# Patient Record
Sex: Female | Born: 1975 | Race: Black or African American | Hispanic: No | Marital: Single | Smoking: Former smoker
Health system: Southern US, Community
[De-identification: ages and names within clinical notes are randomized; demographics above are authoritative.]

## PROBLEM LIST (undated history)

## (undated) DIAGNOSIS — E785 Hyperlipidemia, unspecified: Secondary | ICD-10-CM

## (undated) DIAGNOSIS — E282 Polycystic ovarian syndrome: Secondary | ICD-10-CM

## (undated) DIAGNOSIS — E119 Type 2 diabetes mellitus without complications: Secondary | ICD-10-CM

## (undated) DIAGNOSIS — E079 Disorder of thyroid, unspecified: Secondary | ICD-10-CM

## (undated) DIAGNOSIS — J45909 Unspecified asthma, uncomplicated: Secondary | ICD-10-CM

## (undated) DIAGNOSIS — Z789 Other specified health status: Secondary | ICD-10-CM

## (undated) DIAGNOSIS — F419 Anxiety disorder, unspecified: Secondary | ICD-10-CM

## (undated) DIAGNOSIS — B977 Papillomavirus as the cause of diseases classified elsewhere: Secondary | ICD-10-CM

## (undated) DIAGNOSIS — G43909 Migraine, unspecified, not intractable, without status migrainosus: Secondary | ICD-10-CM

## (undated) DIAGNOSIS — J302 Other seasonal allergic rhinitis: Secondary | ICD-10-CM

## (undated) HISTORY — DX: Other specified health status: Z78.9

## (undated) HISTORY — DX: Type 2 diabetes mellitus without complications: E11.9

## (undated) HISTORY — PX: OTHER SURGICAL HISTORY: SHX169

## (undated) HISTORY — DX: Unspecified asthma, uncomplicated: J45.909

## (undated) HISTORY — PX: WISDOM TOOTH EXTRACTION: SHX21

## (undated) HISTORY — DX: Anxiety disorder, unspecified: F41.9

## (undated) HISTORY — DX: Other seasonal allergic rhinitis: J30.2

## (undated) HISTORY — DX: Hyperlipidemia, unspecified: E78.5

## (undated) HISTORY — DX: Papillomavirus as the cause of diseases classified elsewhere: B97.7

## (undated) HISTORY — PX: EXCISION VAGINAL CYST: SHX5825

## (undated) HISTORY — DX: Migraine, unspecified, not intractable, without status migrainosus: G43.909

---

## 1998-11-09 ENCOUNTER — Emergency Department (HOSPITAL_COMMUNITY): Admission: EM | Admit: 1998-11-09 | Discharge: 1998-11-09 | Payer: Self-pay | Admitting: Emergency Medicine

## 2001-12-26 ENCOUNTER — Other Ambulatory Visit: Admission: RE | Admit: 2001-12-26 | Discharge: 2001-12-26 | Payer: Self-pay | Admitting: Obstetrics and Gynecology

## 2002-04-27 ENCOUNTER — Emergency Department (HOSPITAL_COMMUNITY): Admission: EM | Admit: 2002-04-27 | Discharge: 2002-04-27 | Payer: Self-pay | Admitting: Emergency Medicine

## 2002-04-27 ENCOUNTER — Encounter: Payer: Self-pay | Admitting: Emergency Medicine

## 2002-05-01 ENCOUNTER — Emergency Department (HOSPITAL_COMMUNITY): Admission: EM | Admit: 2002-05-01 | Discharge: 2002-05-02 | Payer: Self-pay | Admitting: Emergency Medicine

## 2002-05-02 ENCOUNTER — Encounter: Payer: Self-pay | Admitting: Emergency Medicine

## 2003-04-08 ENCOUNTER — Other Ambulatory Visit: Admission: RE | Admit: 2003-04-08 | Discharge: 2003-04-08 | Payer: Self-pay | Admitting: Obstetrics and Gynecology

## 2003-07-30 ENCOUNTER — Encounter: Admission: RE | Admit: 2003-07-30 | Discharge: 2003-07-30 | Payer: Self-pay | Admitting: Obstetrics and Gynecology

## 2009-03-17 ENCOUNTER — Emergency Department (HOSPITAL_COMMUNITY): Admission: EM | Admit: 2009-03-17 | Discharge: 2009-03-18 | Payer: Self-pay | Admitting: Emergency Medicine

## 2010-10-02 LAB — URINALYSIS, ROUTINE W REFLEX MICROSCOPIC
Bilirubin Urine: NEGATIVE
Glucose, UA: NEGATIVE mg/dL
Ketones, ur: NEGATIVE mg/dL
Leukocytes, UA: NEGATIVE
Nitrite: NEGATIVE
Protein, ur: NEGATIVE mg/dL
Specific Gravity, Urine: 1.028 (ref 1.005–1.030)
Urobilinogen, UA: 1 mg/dL (ref 0.0–1.0)
pH: 6.5 (ref 5.0–8.0)

## 2010-10-02 LAB — URINE MICROSCOPIC-ADD ON

## 2010-10-02 LAB — POCT PREGNANCY, URINE: Preg Test, Ur: NEGATIVE

## 2010-10-02 LAB — GC/CHLAMYDIA PROBE AMP, GENITAL
Chlamydia, DNA Probe: NEGATIVE
GC Probe Amp, Genital: NEGATIVE

## 2012-01-09 ENCOUNTER — Emergency Department (INDEPENDENT_AMBULATORY_CARE_PROVIDER_SITE_OTHER)
Admission: EM | Admit: 2012-01-09 | Discharge: 2012-01-09 | Disposition: A | Discharge status: Home / Self Care | Payer: PRIVATE HEALTH INSURANCE | Source: Home / Self Care | Attending: Emergency Medicine | Admitting: Emergency Medicine

## 2012-01-09 ENCOUNTER — Encounter (HOSPITAL_COMMUNITY): Payer: Self-pay | Admitting: Emergency Medicine

## 2012-01-09 DIAGNOSIS — J069 Acute upper respiratory infection, unspecified: Secondary | ICD-10-CM

## 2012-01-09 HISTORY — DX: Polycystic ovarian syndrome: E28.2

## 2012-01-09 MED ORDER — AMOXICILLIN 500 MG PO CAPS: 500.0000 mg | ORAL_CAPSULE | Freq: Three times a day (TID) | ORAL | Status: AC

## 2012-01-09 MED ORDER — LORATADINE-PSEUDOEPHEDRINE ER 5-120 MG PO TB12: 1.0000 | ORAL_TABLET | Freq: Two times a day (BID) | ORAL | Status: DC

## 2012-01-09 MED ORDER — GUAIFENESIN-CODEINE 100-10 MG/5ML PO SYRP: 5.0000 mL | ORAL_SOLUTION | Freq: Three times a day (TID) | ORAL | Status: AC | PRN

## 2012-01-09 NOTE — ED Notes (Signed)
Sinus drainage, chest congestion, changes in sound of voice.  Intermittent sore throat.  Initially mucus was green, now yellow.  Onset Wednesday night

## 2012-01-09 NOTE — ED Provider Notes (Signed)
History     CSN: [id number]  Arrival date & time 01/09/12  1224   First MD Initiated Contact with Patient 01/09/12 1256      Chief Complaint  Patient presents with  . URI    (Consider location/radiation/quality/duration/timing/severity/associated sxs/prior treatment) HPI Comments: Since Wednesday patient has been experiencing sinus drainage and nasal congestion along with a sore throat most recently with a cough that occasionally is dry but occasionally she brings up sputum looking with yellow to green in character. Patient says is feeling tired and fatigue and have tried over-the-counter medicines that include oxacillin surplus and Tylenol. She denies any wheezing or shortness of breath.  Patient is a 36 y.o. female presenting with URI.  URI The primary symptoms include fatigue, sore throat and cough. Primary symptoms do not include fever, ear pain, wheezing, abdominal pain, myalgias, arthralgias or rash. The current episode started 3 to 5 days ago. This is a new problem. The problem has not changed since onset. Symptoms associated with the illness include chills, sinus pressure, congestion and rhinorrhea.    No past medical history on file.  No past surgical history on file.  No family history on file.  History  Substance Use Topics  . Smoking status: Not on file  . Smokeless tobacco: Not on file  . Alcohol Use: Not on file    OB History    No data available      Review of Systems  Constitutional: Positive for chills, activity change, appetite change and fatigue. Negative for fever, diaphoresis and unexpected weight change.  HENT: Positive for congestion, sore throat, rhinorrhea, neck pain and sinus pressure. Negative for ear pain, neck stiffness and ear discharge.   Eyes: Negative for photophobia and visual disturbance.  Respiratory: Positive for cough. Negative for choking, chest tightness, shortness of breath and wheezing.   Gastrointestinal: Negative for abdominal  pain.  Musculoskeletal: Negative for myalgias and arthralgias.  Skin: Negative for rash.  Neurological: Negative for dizziness.    Allergies  Allegra and Sulfa antibiotics  Home Medications   Current Outpatient Rx  Name Route Sig Dispense Refill  . AMOXICILLIN 500 MG PO CAPS Oral Take 1 capsule (500 mg total) by mouth 3 (three) times daily. 30 capsule 0  . GUAIFENESIN-CODEINE 100-10 MG/5ML PO SYRP Oral Take 5 mLs by mouth 3 (three) times daily as needed for cough. 120 mL 0  . LORATADINE-PSEUDOEPHEDRINE ER 5-120 MG PO TB12 Oral Take 1 tablet by mouth 2 (two) times daily. 14 tablet 0    BP 120/58  Pulse 92  Temp 98.8 F (37.1 C) (Oral)  Resp 16  SpO2 100%  Physical Exam  Nursing note and vitals reviewed. Constitutional: Vital signs are normal. She appears well-developed and well-nourished.  Non-toxic appearance. She does not have a sickly appearance. She does not appear ill. No distress.  HENT:  Head: Normocephalic.  Right Ear: Tympanic membrane normal.  Left Ear: Tympanic membrane normal.  Nose: Nose normal.  Mouth/Throat: Uvula is midline and mucous membranes are normal. Posterior oropharyngeal erythema present. No oropharyngeal exudate, posterior oropharyngeal edema or tonsillar abscesses.  Eyes: Conjunctivae are normal. Right eye exhibits no discharge. Left eye exhibits no discharge. No scleral icterus.  Neck: Trachea normal. Neck supple. No JVD present. No Kernig's sign noted.  Cardiovascular: Normal rate.   Pulmonary/Chest: Effort normal and breath sounds normal. No respiratory distress. She has no decreased breath sounds. She has no wheezes. She has no rhonchi. She has no rales.  Abdominal: Soft.  Musculoskeletal: Normal range of motion.  Lymphadenopathy:    She has no cervical adenopathy.  Neurological: She is alert.  Skin: No erythema.    ED Course  Procedures (including critical care time)  Labs Reviewed - No data to display No results found.   1. Upper  respiratory infection       MDM  Uncomplicated upper respiratory process. Patient encouraged to treat herself symptomatically with prescribed codeine syrup and Claritin D. to use Tylenol for temperatures above 100.8. If symptoms were to persist or worsen her last greater than 8 days as patient to either return or to start taking provided antibiotic prescription. Patient agree with treatment plan symptomatic management and will pursue further hydration, before starting with antibiotic        Jimmie Molly, MD 01/09/12 1433

## 2012-04-18 ENCOUNTER — Ambulatory Visit (INDEPENDENT_AMBULATORY_CARE_PROVIDER_SITE_OTHER): Payer: BC Managed Care – PPO | Admitting: Family Medicine

## 2012-04-18 ENCOUNTER — Encounter: Payer: Self-pay | Admitting: Family Medicine

## 2012-04-18 VITALS — BP 118/72 | Temp 98.0°F | Ht 72.0 in | Wt 147.0 lb

## 2012-04-18 DIAGNOSIS — E0590 Thyrotoxicosis, unspecified without thyrotoxic crisis or storm: Secondary | ICD-10-CM

## 2012-04-18 MED ORDER — VARENICLINE TARTRATE 0.5 MG PO TABS: ORAL_TABLET | ORAL | Status: DC

## 2012-04-18 MED ORDER — ATENOLOL 25 MG PO TABS: 25.0000 mg | ORAL_TABLET | Freq: Every day | ORAL | Status: DC

## 2012-04-18 NOTE — Progress Notes (Signed)
Subjective:    Patient ID: Danielle [name], female    DOB: 1976/04/30, 36 y.o.   MRN: [id number]  HPI Danielle [name] is a 36 year old single female nonsmoker who comes in today on referral from her GYN Dr. Malva Limes for evaluation of hyperthyroidism  Dr. Dareen Piano saw her recently for a pelvic examination which was normal. At the time they did some lab work because she wasn't feeling well. TSH level came back 0.006 with a free T4 of 4.9  She states she really has not felt well since February. In February she began to lose weight and over the past 9 months has lost 30 pounds. Weight is down to 147 pounds.  She's also noticed episodes of rapid heart rate nervousness anxiety which she attributed to the stress of working and going to school at the same time. Yesterday she be signed at her job at [employer] because of the stress.  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 Dareen Piano normal. She does smoke 2 cigarettes a day  Review of systems reveals that she has migraine headaches but they're episodic last migraine was 2007. She does wear glasses for distance vision. She does get regular dental care. She was diagnosed in 2004 to have asthma last attack was 2005. GI review of systems negative GU negative except she did have a history of constipation now she is having 3 bowel movements daily. She is on BCP by Dr. Dareen Piano she does not do BSE monthly. Her paternal grandmother and aunt have both had breast cancer. The rest review of systems negative social history she is a Physicist, medical a Clinical research associate major  Family history see note mother has a history of hypothyroidism     Review of Systems  Constitutional: Positive for unexpected weight change.  HENT: Negative.   Eyes: Negative.   Respiratory: Negative.   Cardiovascular: Positive for palpitations.  Gastrointestinal: Negative.   Genitourinary: Negative.   Musculoskeletal:  Negative.   Neurological: Negative.   Hematological: Negative.   Psychiatric/Behavioral: Positive for agitation.       Objective:   Physical Exam  Constitutional: She appears well-developed and well-nourished.  HENT:  Head: Normocephalic and atraumatic.  Right Ear: External ear normal.  Left Ear: External ear normal.  Nose: Nose normal.  Mouth/Throat: Oropharynx is clear and moist.  Eyes: EOM are normal. Pupils are equal, round, and reactive to light.  Neck: Normal range of motion. Neck supple. No thyromegaly present.  Cardiovascular: Normal rate, regular rhythm, normal heart sounds and intact distal pulses.  Exam reveals no gallop and no friction rub.   No murmur heard. Pulmonary/Chest: Effort normal and breath sounds normal.  Abdominal: Soft. Bowel sounds are normal. She exhibits no distension and no mass. There is no tenderness. There is no rebound.  Genitourinary:       Bilateral breast exam shows a prominent fibrous cystic lesion marble size left breast 10:00 1 inch from the nipple also multiple BB sized fibrocystic changes throughout both breasts  Musculoskeletal: Normal range of motion.  Lymphadenopathy:    She has no cervical adenopathy.  Neurological: She is alert. She has normal reflexes. No cranial nerve deficit. She exhibits normal muscle tone. Coordination normal.  Skin: Skin is warm and dry.       Skin exam normal tattoo on her back  Psychiatric: She has a normal mood and affect. Her behavior is normal. Judgment and thought content normal.  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

## 2012-04-18 NOTE — Patient Instructions (Signed)
We will get she is setup for a thyroid scan ASAP  Return to see me 2-3 days after the scan for followup  Begin a Tenormin one tablet daily  Also begin the Chantix one half tab daily

## 2012-05-03 ENCOUNTER — Encounter (HOSPITAL_COMMUNITY)
Admission: RE | Admit: 2012-05-03 | Discharge: 2012-05-03 | Disposition: A | Discharge status: Home / Self Care | Payer: BC Managed Care – PPO | Source: Ambulatory Visit | Attending: Family Medicine | Admitting: Family Medicine

## 2012-05-03 DIAGNOSIS — E0590 Thyrotoxicosis, unspecified without thyrotoxic crisis or storm: Secondary | ICD-10-CM

## 2012-05-04 ENCOUNTER — Encounter (HOSPITAL_COMMUNITY)
Admission: RE | Admit: 2012-05-04 | Discharge: 2012-05-04 | Disposition: A | Discharge status: Home / Self Care | Payer: BC Managed Care – PPO | Source: Ambulatory Visit | Attending: Family Medicine | Admitting: Family Medicine

## 2012-05-04 MED ORDER — SODIUM PERTECHNETATE TC 99M INJECTION
10.5000 | Freq: Once | INTRAVENOUS | Status: AC | PRN
  Administered 2012-05-04: 11 via INTRAVENOUS

## 2012-05-04 MED ORDER — SODIUM IODIDE I 131 CAPSULE
11.7000 | Freq: Once | INTRAVENOUS | Status: AC | PRN
  Administered 2012-05-04: 11.7 via ORAL

## 2012-05-08 ENCOUNTER — Ambulatory Visit (INDEPENDENT_AMBULATORY_CARE_PROVIDER_SITE_OTHER): Payer: BC Managed Care – PPO | Admitting: Family Medicine

## 2012-05-08 ENCOUNTER — Encounter: Payer: Self-pay | Admitting: Family Medicine

## 2012-05-08 DIAGNOSIS — Z720 Tobacco use: Secondary | ICD-10-CM

## 2012-05-08 DIAGNOSIS — E0590 Thyrotoxicosis, unspecified without thyrotoxic crisis or storm: Secondary | ICD-10-CM

## 2012-05-08 DIAGNOSIS — Q6500 Congenital dislocation of unspecified hip, unilateral: Secondary | ICD-10-CM

## 2012-05-08 DIAGNOSIS — M25551 Pain in right hip: Secondary | ICD-10-CM

## 2012-05-08 DIAGNOSIS — F17200 Nicotine dependence, unspecified, uncomplicated: Secondary | ICD-10-CM

## 2012-05-08 DIAGNOSIS — E0500 Thyrotoxicosis with diffuse goiter without thyrotoxic crisis or storm: Secondary | ICD-10-CM

## 2012-05-08 DIAGNOSIS — Z8639 Personal history of other endocrine, nutritional and metabolic disease: Secondary | ICD-10-CM | POA: Insufficient documentation

## 2012-05-08 DIAGNOSIS — M25559 Pain in unspecified hip: Secondary | ICD-10-CM

## 2012-05-08 DIAGNOSIS — Q652 Congenital dislocation of hip, unspecified: Secondary | ICD-10-CM

## 2012-05-08 MED ORDER — VARENICLINE TARTRATE 1 MG PO TABS: ORAL_TABLET | ORAL | Status: DC

## 2012-05-08 NOTE — Progress Notes (Signed)
  Subjective:    Patient ID: Danielle [name], female    DOB: [birth year], 36 y.o.   MRN: [id number]  HPI Danielle [name] is a 36 year old single female nonsmoker who comes in today for followup of Graves' disease  We saw her recently with symptoms of hyperthyroidism. Her scan and uptake showed a 51% symmetrical uptake. No evidence of malignancy. She's on Tenormin 25 mg daily to suppress rapid heart rate in tremor.  We spent a fair amount of time discussing the cause,,,,,,, which is unknown,,,,,,, and the treatment options. She's accompanied today by her mother who states that she had the same problem postpartum and elected to take the pills instead of the I-131. She states that on the pills she became euthyroid and has stayed that way since.   Review of Systems    general and endocrinology review of systems otherwise negative Objective:   Physical Exam Well-developed well-nourished female no acute distress cardiac exam normal except for rapid heart rate 90 and regular Thyroid symmetrically enlarged       Assessment & Plan:  Graves' disease. Plan recommend the I-131

## 2012-05-08 NOTE — Patient Instructions (Signed)
Increase the Tenormin take one twice daily  You will get a call from radiology to come in and get the I-131  See me 10 days after your treatment for followup

## 2012-05-09 ENCOUNTER — Ambulatory Visit (INDEPENDENT_AMBULATORY_CARE_PROVIDER_SITE_OTHER)
Admission: RE | Admit: 2012-05-09 | Discharge: 2012-05-09 | Disposition: A | Discharge status: Home / Self Care | Payer: BC Managed Care – PPO | Source: Ambulatory Visit | Attending: Family Medicine | Admitting: Family Medicine

## 2012-05-09 ENCOUNTER — Other Ambulatory Visit (HOSPITAL_COMMUNITY): Payer: Self-pay | Admitting: Diagnostic Radiology

## 2012-05-09 ENCOUNTER — Other Ambulatory Visit: Payer: Self-pay | Admitting: Family Medicine

## 2012-05-09 DIAGNOSIS — E0500 Thyrotoxicosis with diffuse goiter without thyrotoxic crisis or storm: Secondary | ICD-10-CM

## 2012-05-09 DIAGNOSIS — M25559 Pain in unspecified hip: Secondary | ICD-10-CM

## 2012-05-09 DIAGNOSIS — Q652 Congenital dislocation of hip, unspecified: Secondary | ICD-10-CM

## 2012-05-09 DIAGNOSIS — M25551 Pain in right hip: Secondary | ICD-10-CM

## 2012-05-09 DIAGNOSIS — Q6500 Congenital dislocation of unspecified hip, unilateral: Secondary | ICD-10-CM

## 2012-06-02 ENCOUNTER — Encounter (HOSPITAL_COMMUNITY)
Admission: RE | Admit: 2012-06-02 | Discharge: 2012-06-02 | Disposition: A | Discharge status: Home / Self Care | Payer: BC Managed Care – PPO | Source: Ambulatory Visit | Attending: Family Medicine | Admitting: Family Medicine

## 2012-06-02 ENCOUNTER — Telehealth: Payer: Self-pay | Admitting: *Deleted

## 2012-06-02 DIAGNOSIS — E0500 Thyrotoxicosis with diffuse goiter without thyrotoxic crisis or storm: Secondary | ICD-10-CM

## 2012-06-02 LAB — HCG, SERUM, QUALITATIVE: Preg, Serum: NEGATIVE

## 2012-06-02 MED ORDER — SODIUM IODIDE I 131 CAPSULE
17.3000 | Freq: Once | INTRAVENOUS | Status: AC | PRN
  Administered 2012-06-02: 17.3 via ORAL

## 2012-06-02 NOTE — Telephone Encounter (Signed)
PATIENT AWARE

## 2012-06-02 NOTE — Telephone Encounter (Signed)
Yes continue atenolol

## 2012-06-02 NOTE — Telephone Encounter (Signed)
Patient just returned from the hospital after taking the 131 Iodine pill.  She is wondering if it is still safe to take her atenolol? Please advise.

## 2012-06-02 NOTE — Telephone Encounter (Signed)
Pt notified to continue Atenolol per Dr. Amador Cunas. Pt verbalized understanding.

## 2012-06-12 ENCOUNTER — Encounter: Payer: Self-pay | Admitting: Family Medicine

## 2012-06-12 ENCOUNTER — Ambulatory Visit (INDEPENDENT_AMBULATORY_CARE_PROVIDER_SITE_OTHER): Payer: BC Managed Care – PPO | Admitting: Family Medicine

## 2012-06-12 VITALS — BP 110/70 | Temp 98.8°F | Wt 148.0 lb

## 2012-06-12 DIAGNOSIS — E0500 Thyrotoxicosis with diffuse goiter without thyrotoxic crisis or storm: Secondary | ICD-10-CM

## 2012-06-12 DIAGNOSIS — E0590 Thyrotoxicosis, unspecified without thyrotoxic crisis or storm: Secondary | ICD-10-CM

## 2012-06-12 LAB — T4, FREE: Free T4: 3.32 ng/dL — ABNORMAL HIGH (ref 0.60–1.60)

## 2012-06-12 LAB — T3, FREE: T3, Free: 11 pg/mL — ABNORMAL HIGH (ref 2.3–4.2)

## 2012-06-12 MED ORDER — ATENOLOL 25 MG PO TABS: ORAL_TABLET | ORAL | Status: DC

## 2012-06-12 NOTE — Patient Instructions (Signed)
Increase the Tenormin take 2 in the morning one at bedtime  I will call you about your lab work and we will decide followup

## 2012-06-12 NOTE — Progress Notes (Signed)
  Subjective:    Patient ID: Danielle [name], female    DOB: Jan 14, 1976, 36 y.o.   MRN: [id number]  HPI Danielle [name] is a 36 year old single female who is trying to quit smoking,,,,,, she's had 3 cigarettes in 3 weeks,,,,,,,, who comes in today for followup of hyperthyroidism  10 days ago she received her dose of RAI. She says she's starting to feel better. She is less anxious although she has been experiencing intermittent headaches and some soreness in the back of her legs. She's also having a lot of palpitations which are mainly controlled with the Tenormin 25 mg twice a day   Review of Systems Review of systems negative    Objective:   Physical Exam  Well-developed well-nourished female no acute distress examination neck shows symmetrical enlargement of the thyroid gland cardiac exam normal pulse 70 and regular BP 110/80      Assessment & Plan:  Hyperthyroidism slowly resolving plan follow TSH levels begin Synthroid when levels begin to drop

## 2012-07-04 ENCOUNTER — Other Ambulatory Visit (INDEPENDENT_AMBULATORY_CARE_PROVIDER_SITE_OTHER): Payer: BC Managed Care – PPO

## 2012-07-04 DIAGNOSIS — E039 Hypothyroidism, unspecified: Secondary | ICD-10-CM

## 2012-07-04 LAB — T3, FREE: T3, Free: 4.4 pg/mL — ABNORMAL HIGH (ref 2.3–4.2)

## 2012-07-05 LAB — T4, FREE: Free T4: 1.87 ng/dL — ABNORMAL HIGH (ref 0.60–1.60)

## 2012-07-07 ENCOUNTER — Other Ambulatory Visit: Payer: BC Managed Care – PPO

## 2012-08-15 ENCOUNTER — Encounter: Payer: Self-pay | Admitting: Family Medicine

## 2012-08-15 ENCOUNTER — Ambulatory Visit (INDEPENDENT_AMBULATORY_CARE_PROVIDER_SITE_OTHER): Payer: BC Managed Care – PPO | Admitting: Family Medicine

## 2012-08-15 VITALS — BP 102/70 | Temp 99.4°F | Wt 162.0 lb

## 2012-08-15 DIAGNOSIS — E0590 Thyrotoxicosis, unspecified without thyrotoxic crisis or storm: Secondary | ICD-10-CM

## 2012-08-15 DIAGNOSIS — R358 Other polyuria: Secondary | ICD-10-CM

## 2012-08-15 DIAGNOSIS — E282 Polycystic ovarian syndrome: Secondary | ICD-10-CM

## 2012-08-15 LAB — POCT URINALYSIS DIPSTICK
Blood, UA: NEGATIVE
Ketones, UA: NEGATIVE
Protein, UA: NEGATIVE
Spec Grav, UA: 1.02
Urobilinogen, UA: 0.2

## 2012-08-15 MED ORDER — SPIRONOLACTONE 100 MG PO TABS: 100.0000 mg | ORAL_TABLET | Freq: Every day | ORAL | Status: DC

## 2012-08-15 NOTE — Patient Instructions (Addendum)
Drink lots of water  Milk of magnesia 2 tablespoons twice daily and 1 MiraLax at bedtime  At goal is to have one or 2 loose bowel movements daily  If after one week your not at goal increase the milk of magnesia 3 tablespoons twice daily  I will call you I gets her lab work back  Spironolactone 1 daily

## 2012-08-15 NOTE — Progress Notes (Signed)
  Subjective:    Patient ID: Danielle [name], female    DOB: Oct 11, 1975, 37 y.o.   MRN: [id number]  HPI Danielle [name] is a  37 year old single female nonsmoker who comes in today for followup of Graves' disease  She developed Graves' disease with profound hyper thyroidism and was given R1 31 about 6 weeks ago. She's here today for followup. She saw her GYN today Dr. Dareen Piano and had numerous studies all of which are pending. She complains of increased urination lower bowel pain fatigue and constipation. She states she only has one bowel movement per week.   Review of Systems Review of systems otherwise negative    Objective:   Physical Exam Well-developed well-nourished thin female no acute distress neck exam normal cardiac exam normal abdominal exam normal except for enlarged:       Assessment & Plan:  Graves' disease plan check TSH level  Constipation begin program to achieve normal bowel habits

## 2012-08-16 LAB — HEMOGLOBIN A1C: Hgb A1c MFr Bld: 5.9 % (ref 4.6–6.5)

## 2012-08-16 LAB — TSH: TSH: 10.46 u[IU]/mL — ABNORMAL HIGH (ref 0.35–5.50)

## 2012-08-18 ENCOUNTER — Telehealth: Payer: Self-pay | Admitting: *Deleted

## 2012-08-18 MED ORDER — LEVOTHYROXINE SODIUM 50 MCG PO TABS: 50.0000 ug | ORAL_TABLET | Freq: Every day | ORAL | Status: DC

## 2012-08-18 NOTE — Telephone Encounter (Signed)
Message copied by Trenton Gammon on Fri Aug 18, 2012  1:54 PM ------      Message from: TODD, JEFFREY A      Created: Wed Aug 16, 2012  1:06 PM       Please call,,,,,,,,,,, lab work indicates that she is becoming hypothyroid which is what we wanted to do with the medication now it's time to start a thyroid supplement Synthroid 50 mcg daily dispense 100 tabs refills x3.  Followup office visit in 3 weeks ------

## 2012-08-22 ENCOUNTER — Ambulatory Visit (INDEPENDENT_AMBULATORY_CARE_PROVIDER_SITE_OTHER): Payer: BC Managed Care – PPO | Admitting: Family Medicine

## 2012-08-22 ENCOUNTER — Telehealth: Payer: Self-pay | Admitting: Family Medicine

## 2012-08-22 ENCOUNTER — Encounter: Payer: Self-pay | Admitting: Family Medicine

## 2012-08-22 VITALS — BP 120/80 | Temp 98.9°F | Wt 164.0 lb

## 2012-08-22 DIAGNOSIS — E0590 Thyrotoxicosis, unspecified without thyrotoxic crisis or storm: Secondary | ICD-10-CM

## 2012-08-22 NOTE — Progress Notes (Signed)
  Subjective:    Patient ID: Danielle [name], female    DOB: [birth year], 37 y.o.   MRN: [id number]  HPI Danielle [name] is a 37 year old female who comes in today for followup of hyperthyroidism.  Week gave her a dose of RAI and her thyroid is cooled off to a point that we started Synthroid 50 mcg daily. She does morning went to stretch her legs and have a lot of leg cramps in her right calf otherwise feels well. She continues to take the beta blocker   Review of Systems Review of systems otherwise negative    Objective:   Physical Exam  Well-developed well-nourished female no acute distress examination the legs and arms and neck are normal cardiac exam normal      Assessment & Plan:  Hyperthyroidism restarting supplement thyroid reassured taper beta blocker

## 2012-08-22 NOTE — Patient Instructions (Signed)
Take the Synthroid one daily  Take the beta blocker........ One tablet at bedtime for one week then stop it completely  Return in 4 weeks for followup labs

## 2012-08-22 NOTE — Telephone Encounter (Signed)
Error - made pt an appt.

## 2012-09-05 ENCOUNTER — Ambulatory Visit: Payer: BC Managed Care – PPO | Admitting: Family Medicine

## 2012-09-19 ENCOUNTER — Other Ambulatory Visit (INDEPENDENT_AMBULATORY_CARE_PROVIDER_SITE_OTHER): Payer: BC Managed Care – PPO

## 2012-09-19 DIAGNOSIS — E0590 Thyrotoxicosis, unspecified without thyrotoxic crisis or storm: Secondary | ICD-10-CM

## 2012-09-21 NOTE — Addendum Note (Signed)
Addended by: Kern Reap B on: 09/21/2012 04:26 PM   Modules accepted: Orders

## 2012-10-24 ENCOUNTER — Other Ambulatory Visit (INDEPENDENT_AMBULATORY_CARE_PROVIDER_SITE_OTHER): Payer: BC Managed Care – PPO

## 2012-10-24 DIAGNOSIS — E0590 Thyrotoxicosis, unspecified without thyrotoxic crisis or storm: Secondary | ICD-10-CM

## 2012-10-27 ENCOUNTER — Encounter: Payer: Self-pay | Admitting: Family Medicine

## 2012-10-27 ENCOUNTER — Telehealth: Payer: Self-pay | Admitting: *Deleted

## 2012-10-27 NOTE — Telephone Encounter (Signed)
Patient is taking 100 mcg already.  Should she increase the dose?  Should she add or take anything from her daily diet?

## 2012-10-30 NOTE — Telephone Encounter (Signed)
Increase to synthroid 150 mcg. No diet changes per Dr Tawanna Cooler

## 2012-10-30 NOTE — Telephone Encounter (Signed)
Left message on machine for patient

## 2012-11-30 ENCOUNTER — Telehealth: Payer: Self-pay | Admitting: Family Medicine

## 2012-11-30 MED ORDER — LEVOTHYROXINE SODIUM 150 MCG PO TABS: 150.0000 ug | ORAL_TABLET | Freq: Every day | ORAL | Status: DC

## 2012-11-30 NOTE — Telephone Encounter (Signed)
Pt needs refill on levothyroxine 150 mcg#90 with 3 refills call into [facility] [address]

## 2012-12-04 ENCOUNTER — Ambulatory Visit (INDEPENDENT_AMBULATORY_CARE_PROVIDER_SITE_OTHER): Payer: BC Managed Care – PPO | Admitting: Family Medicine

## 2012-12-04 ENCOUNTER — Encounter: Payer: Self-pay | Admitting: Family Medicine

## 2012-12-04 VITALS — BP 110/80 | Temp 98.2°F | Wt 172.0 lb

## 2012-12-04 DIAGNOSIS — F32A Depression, unspecified: Secondary | ICD-10-CM | POA: Insufficient documentation

## 2012-12-04 DIAGNOSIS — E0590 Thyrotoxicosis, unspecified without thyrotoxic crisis or storm: Secondary | ICD-10-CM

## 2012-12-04 DIAGNOSIS — F329 Major depressive disorder, single episode, unspecified: Secondary | ICD-10-CM

## 2012-12-04 MED ORDER — LEVOTHYROXINE SODIUM 200 MCG PO TABS: 200.0000 ug | ORAL_TABLET | Freq: Every day | ORAL | Status: DC

## 2012-12-04 MED ORDER — BUPROPION HCL ER (SR) 100 MG PO TB12: 100.0000 mg | ORAL_TABLET | Freq: Two times a day (BID) | ORAL | Status: DC

## 2012-12-04 NOTE — Patient Instructions (Signed)
Increase the Synthroid to 200 mcg daily;;;;;;;;;; trotter wait and not have anything to eat or drink for 30 minutes  Wellbutrin 100 mg long-acting,,,,,,,,, one tablet daily in the morning  Also takes her BCPs in the morning  Return in one month for followup sooner if any problems

## 2012-12-04 NOTE — Progress Notes (Signed)
  Subjective:    Patient ID: Danielle [name], female    DOB: 06-27-1976, 37 y.o.   MRN: [id number]  HPI Makhia is a 37 year old single female nonsmoker who comes in today for evaluation of 2 problems  She presented last winter with Graves' disease. We gave her radioactive iodine and her TSH level dropped to 0.04 and her thyroid gland shrunk. We have increased her Synthroid every month. Her TSH is now 13 on 150 mcg of Synthroid daily.  She's also having difficulty with sleep. She states she goes to sleep and go to sleep wakes up to go back to sleep. This is going on for about 6 months. She's also been having symptoms of depression. She's had this before and has tried Wellbutrin Paxil and Desyrel all of which helped she would prefer to go back on the Wellbutrin.  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 She's on BCPs because of PCO S. Currently not sexually active. Her caffeine consumption is minimal. No suicidal ideation   Review of Systems    metabolic and psychiatric review of systems otherwise negative Objective:   Physical Exam  Well-developed well-nourished female no acute distress examination of the neck shows a nonpalpable thyroid. Cardiac exam normal  She is oriented x3 she appears normal not depressed. Again no suicidal ideation      Assessment & Plan:  Graves' disease with hypothyroidism TSH level not at goal increase Synthroid to 200 mcg daily followup in one month  Depression begin Wellbutrin followup in one month

## 2013-01-08 ENCOUNTER — Ambulatory Visit (INDEPENDENT_AMBULATORY_CARE_PROVIDER_SITE_OTHER): Payer: BC Managed Care – PPO | Admitting: Family Medicine

## 2013-01-08 ENCOUNTER — Encounter: Payer: Self-pay | Admitting: Family Medicine

## 2013-01-08 VITALS — BP 110/70 | Temp 98.9°F | Wt 174.0 lb

## 2013-01-08 DIAGNOSIS — E0590 Thyrotoxicosis, unspecified without thyrotoxic crisis or storm: Secondary | ICD-10-CM

## 2013-01-08 DIAGNOSIS — E0500 Thyrotoxicosis with diffuse goiter without thyrotoxic crisis or storm: Secondary | ICD-10-CM

## 2013-01-08 LAB — TSH: TSH: 0.04 u[IU]/mL — ABNORMAL LOW (ref 0.35–5.50)

## 2013-01-08 NOTE — Patient Instructions (Signed)
Continue your current dose of Synthroid 200 mcg daily  We will call you and get your blood work back and then we will decide followup  Has to mother to contact a physician in [place] to get a record of what happened with your hips

## 2013-01-08 NOTE — Progress Notes (Signed)
  Subjective:    Patient ID: Danielle [name], female    DOB: 04-28-1976, 37 y.o.   MRN: [id number]  HPI Danielle [name] is a 37 year old female who comes in today for followup of Graves' disease  We treated her with RAI and she's now on Synthroid 200 mcg daily. She's due for followup thyroid level today. She feels well and has no complaints except for some cramping in her legs.  Her mother told she has something with her hips when she was a baby but we don't have any details.   Review of Systems    review of systems otherwise negative Objective:   Physical Exam  Well-developed well nourished female no acute distress examination of legs is normal she takes has more range of motion on her right 80 left 60      Assessment & Plan:  Graves' disease status post RAI therapy continue Synthroid 200 mcg daily check TSH level  History of some type of problem with her hips as a child asked her to get her medical records

## 2013-01-10 ENCOUNTER — Other Ambulatory Visit: Payer: Self-pay | Admitting: *Deleted

## 2013-01-10 MED ORDER — LEVOTHYROXINE SODIUM 100 MCG PO TABS: 100.0000 ug | ORAL_TABLET | Freq: Every day | ORAL | Status: DC

## 2013-01-10 NOTE — Telephone Encounter (Signed)
Patient aware of lab results.

## 2013-02-13 ENCOUNTER — Other Ambulatory Visit (INDEPENDENT_AMBULATORY_CARE_PROVIDER_SITE_OTHER): Payer: BC Managed Care – PPO

## 2013-02-13 DIAGNOSIS — E039 Hypothyroidism, unspecified: Secondary | ICD-10-CM

## 2013-02-13 LAB — TSH: TSH: 1.87 u[IU]/mL (ref 0.35–5.50)

## 2013-02-15 ENCOUNTER — Other Ambulatory Visit: Payer: Self-pay | Admitting: Family Medicine

## 2013-02-15 DIAGNOSIS — E0590 Thyrotoxicosis, unspecified without thyrotoxic crisis or storm: Secondary | ICD-10-CM

## 2013-05-10 ENCOUNTER — Encounter: Payer: Self-pay | Admitting: Family Medicine

## 2013-05-10 ENCOUNTER — Ambulatory Visit (INDEPENDENT_AMBULATORY_CARE_PROVIDER_SITE_OTHER): Payer: BC Managed Care – PPO | Admitting: Family Medicine

## 2013-05-10 VITALS — BP 122/88 | Temp 98.2°F | Wt 189.0 lb

## 2013-05-10 DIAGNOSIS — E0500 Thyrotoxicosis with diffuse goiter without thyrotoxic crisis or storm: Secondary | ICD-10-CM

## 2013-05-10 DIAGNOSIS — F329 Major depressive disorder, single episode, unspecified: Secondary | ICD-10-CM

## 2013-05-10 LAB — CBC WITH DIFFERENTIAL/PLATELET
Eosinophils Relative: 2.6 % (ref 0.0–5.0)
Lymphocytes Relative: 45.1 % (ref 12.0–46.0)
MCV: 93.5 fl (ref 78.0–100.0)
Monocytes Absolute: 0.5 10*3/uL (ref 0.1–1.0)
Monocytes Relative: 8.1 % (ref 3.0–12.0)
Neutrophils Relative %: 43.6 % (ref 43.0–77.0)
Platelets: 273 10*3/uL (ref 150.0–400.0)
RBC: 4.17 Mil/uL (ref 3.87–5.11)
WBC: 6.3 10*3/uL (ref 4.5–10.5)

## 2013-05-10 LAB — TSH: TSH: 12.91 u[IU]/mL — ABNORMAL HIGH (ref 0.35–5.50)

## 2013-05-10 NOTE — Progress Notes (Signed)
Pre visit review using our clinic review tool, if applicable. No additional management support is needed unless otherwise documented below in the visit note. 

## 2013-05-10 NOTE — Patient Instructions (Signed)
Continue your current medications  Call and make an appointment to see [name] ASAP  I will call you I get the report on your thyroid next Monday

## 2013-05-10 NOTE — Progress Notes (Signed)
  Subjective:    Patient ID: Danielle [name], female    DOB: 03/27/1976, 37 y.o.   MRN: [id number]  HPI Danielle [name] is a 37 year old female who comes in today for evaluation of a couple issues  She has a history of curies disease status post RAI therapy and is on Synthroid 100 mcg daily. She's due for followup TSH level  She says she has soreness in her neck for one day no fever sore throat etc.  He's also on Wellbutrin 100 mg twice a day however she feels like it may not be working. She's having difficulty sleeping and she's having episodes where she spontaneously will break out and start crying. This is going on now for couple months. She would like to pursue that further I recommended Danielle [name]   Review of Systems    review of systems otherwise negative Objective:   Physical Exam  Well-developed well-nourished female no acute distress vital signs stable she's afebrile HEENT he exam normal neck normal thyroid not palpable no adenopathy      Assessment & Plan:  History of Graves' disease status post RAI therapy check TSH level  Soreness in her neck unknown etiology  Depression unresolved with Wellbutrin 100 twice a day consult with Danielle [name]

## 2013-05-15 ENCOUNTER — Telehealth: Payer: Self-pay | Admitting: Family Medicine

## 2013-05-15 MED ORDER — LEVOTHYROXINE SODIUM 125 MCG PO TABS: 125.0000 ug | ORAL_TABLET | Freq: Every day | ORAL | Status: DC

## 2013-05-15 NOTE — Telephone Encounter (Signed)
Pt states she received message from you to increase her levothyroxine (SYNTHROID, LEVOTHROID) 100 MCG tablet To 125 MCG, pt needs new rx. [facility] [address] (pls note this is a different pharm from before)

## 2013-05-15 NOTE — Telephone Encounter (Signed)
Rx sent to pharmacy   

## 2013-05-16 ENCOUNTER — Other Ambulatory Visit: Payer: BC Managed Care – PPO

## 2013-07-03 ENCOUNTER — Other Ambulatory Visit (INDEPENDENT_AMBULATORY_CARE_PROVIDER_SITE_OTHER): Payer: BC Managed Care – PPO

## 2013-07-03 DIAGNOSIS — E0590 Thyrotoxicosis, unspecified without thyrotoxic crisis or storm: Secondary | ICD-10-CM

## 2013-07-03 LAB — TSH: TSH: 0.85 u[IU]/mL (ref 0.35–5.50)

## 2013-10-08 ENCOUNTER — Ambulatory Visit (INDEPENDENT_AMBULATORY_CARE_PROVIDER_SITE_OTHER): Payer: BC Managed Care – PPO | Admitting: Family Medicine

## 2013-10-08 ENCOUNTER — Encounter: Payer: Self-pay | Admitting: Family Medicine

## 2013-10-08 VITALS — BP 110/80 | Temp 98.8°F | Wt 178.0 lb

## 2013-10-08 DIAGNOSIS — F329 Major depressive disorder, single episode, unspecified: Secondary | ICD-10-CM

## 2013-10-08 DIAGNOSIS — F32A Depression, unspecified: Secondary | ICD-10-CM

## 2013-10-08 DIAGNOSIS — M79609 Pain in unspecified limb: Secondary | ICD-10-CM

## 2013-10-08 DIAGNOSIS — F3289 Other specified depressive episodes: Secondary | ICD-10-CM

## 2013-10-08 DIAGNOSIS — E0500 Thyrotoxicosis with diffuse goiter without thyrotoxic crisis or storm: Secondary | ICD-10-CM

## 2013-10-08 DIAGNOSIS — M79604 Pain in right leg: Secondary | ICD-10-CM | POA: Insufficient documentation

## 2013-10-08 DIAGNOSIS — E282 Polycystic ovarian syndrome: Secondary | ICD-10-CM

## 2013-10-08 MED ORDER — BUPROPION HCL ER (XL) 300 MG PO TB24: 300.0000 mg | ORAL_TABLET | Freq: Every day | ORAL | Status: DC

## 2013-10-08 MED ORDER — SPIRONOLACTONE 100 MG PO TABS: 100.0000 mg | ORAL_TABLET | Freq: Every day | ORAL | Status: DC

## 2013-10-08 NOTE — Patient Instructions (Signed)
Wellbutrin 300 mg ,,,,,,,,,,,, 1 in the morning  Dr. Andee PolesParish [name]  TSH level today...........Marland Kitchen. we will call you the report  Refill spironolactone............Marland Kitchen. 1 daily  For your leg pain our recommend Motrin 400 mg twice daily with food  Return when necessary

## 2013-10-08 NOTE — Progress Notes (Signed)
   Subjective:    Patient ID: Danielle [name], female    DOB: January 08, 1976, 38 y.o.   MRN: [id number]  HPI Danielle [name] is a 38 year old single female who comes in today for evaluation of multiple issues  She states last Wednesday she had some soreness in her right calf where she was climbing stairs. Since that time it's improved without any medication. No history of trauma. She's had no other symptoms of chest pain shortness or breath etc. etc.  She's taken Wellbutrin 100 mg twice a day for chronic depression. 10 years ago she saw a psychiatrist who put her on this medication. Over the last couple months she felt fatigued and mood swings.  She's also on Synthroid 125 mcg daily. She had Graves' disease which was treated with RAI. Last TSH level in October was normal.  She takes prolactin 100 mg daily for mild fluid retention and BP today 110/80.  She was on birth control pills these were stopped in February by Dr. Dareen PianoAnderson her GYN.   Review of Systems Negative will well-developed    Objective:   Physical Exam Well-developed well-nourished female no acute distress vital signs stable she is afebrile examination leg it appears normal there is no swelling erythema. There is no calf tenderness to palpation. Then she says is sometimes sore in her knee. Pulses normal no tenderness in the popliteal fossa       Assessment & Plan:  Pain in right leg etiology unknown...Marland Kitchen.Marland Kitchen.Marland Kitchen. treat symptomatically  Fatigue mood swings increase Wellbutrin......... psych consult when necessary  Fluid retention...Marland Kitchen.Marland Kitchen.Marland Kitchen. Refill Danielle [name]  Graves' disease treated with RAI,,,,,,, check TSH level

## 2013-10-08 NOTE — Progress Notes (Signed)
Pre visit review using our clinic review tool, if applicable. No additional management support is needed unless otherwise documented below in the visit note. 

## 2013-10-09 ENCOUNTER — Telehealth: Payer: Self-pay | Admitting: Family Medicine

## 2013-10-09 DIAGNOSIS — E0500 Thyrotoxicosis with diffuse goiter without thyrotoxic crisis or storm: Secondary | ICD-10-CM

## 2013-10-09 LAB — TSH: TSH: 0.24 u[IU]/mL — ABNORMAL LOW (ref 0.35–5.50)

## 2013-10-09 NOTE — Telephone Encounter (Signed)
Pt states she received call and thinks it may have been you about lab results yesterday. pls call work number

## 2013-10-09 NOTE — Telephone Encounter (Signed)
Spoke with patient, lab order placed, appointment made

## 2013-11-12 ENCOUNTER — Other Ambulatory Visit (INDEPENDENT_AMBULATORY_CARE_PROVIDER_SITE_OTHER): Payer: BC Managed Care – PPO

## 2013-11-12 ENCOUNTER — Other Ambulatory Visit: Payer: BC Managed Care – PPO

## 2013-11-12 DIAGNOSIS — E0500 Thyrotoxicosis with diffuse goiter without thyrotoxic crisis or storm: Secondary | ICD-10-CM

## 2013-11-12 LAB — TSH: TSH: 7.83 u[IU]/mL — ABNORMAL HIGH (ref 0.35–4.50)

## 2013-11-26 ENCOUNTER — Telehealth: Payer: Self-pay | Admitting: Family Medicine

## 2013-11-26 DIAGNOSIS — E0500 Thyrotoxicosis with diffuse goiter without thyrotoxic crisis or storm: Secondary | ICD-10-CM

## 2013-11-26 MED ORDER — LEVOTHYROXINE SODIUM 125 MCG PO TABS: 125.0000 ug | ORAL_TABLET | Freq: Every day | ORAL | Status: DC

## 2013-11-26 NOTE — Telephone Encounter (Signed)
Pt would like blood work results °

## 2013-11-26 NOTE — Telephone Encounter (Signed)
Patient is aware and refill sent.  Lab order placed.

## 2014-01-10 ENCOUNTER — Telehealth: Payer: Self-pay | Admitting: Family Medicine

## 2014-01-10 DIAGNOSIS — F32A Depression, unspecified: Secondary | ICD-10-CM

## 2014-01-10 DIAGNOSIS — F329 Major depressive disorder, single episode, unspecified: Secondary | ICD-10-CM

## 2014-01-10 MED ORDER — BUPROPION HCL ER (SR) 100 MG PO TB12: 100.0000 mg | ORAL_TABLET | Freq: Two times a day (BID) | ORAL | Status: DC

## 2014-01-10 NOTE — Telephone Encounter (Signed)
Pt needs new rx buPROPion (WELLBUTRIN SR) 100 MG 12 hr tablet  Sent to a new pharm: [facility] Pt states she had filled here a long time ago doesn't know the number Pt would like a cb when this is done.

## 2014-01-10 NOTE — Telephone Encounter (Signed)
Please call patient and let her know that her prescription was sent - thanks

## 2014-01-11 MED ORDER — LEVOTHYROXINE SODIUM 125 MCG PO TABS: 125.0000 ug | ORAL_TABLET | Freq: Every day | ORAL | Status: DC

## 2014-01-11 NOTE — Telephone Encounter (Signed)
Pt also needs levothyroxine call into new pharm [facility] today

## 2014-01-11 NOTE — Telephone Encounter (Signed)
Done

## 2014-04-03 ENCOUNTER — Telehealth: Payer: Self-pay | Admitting: Family Medicine

## 2014-04-03 DIAGNOSIS — F32A Depression, unspecified: Secondary | ICD-10-CM

## 2014-04-03 DIAGNOSIS — F329 Major depressive disorder, single episode, unspecified: Secondary | ICD-10-CM

## 2014-04-03 MED ORDER — LEVOTHYROXINE SODIUM 125 MCG PO TABS: 125.0000 ug | ORAL_TABLET | Freq: Every day | ORAL | Status: DC

## 2014-04-03 MED ORDER — BUPROPION HCL ER (XL) 300 MG PO TB24: 300.0000 mg | ORAL_TABLET | Freq: Every day | ORAL | Status: DC

## 2014-04-03 MED ORDER — BUPROPION HCL ER (SR) 100 MG PO TB12: 100.0000 mg | ORAL_TABLET | Freq: Two times a day (BID) | ORAL | Status: DC

## 2014-04-03 NOTE — Telephone Encounter (Signed)
Pt request refill levothyroxine (SYNTHROID, LEVOTHROID) 125 MCG tablet 30 day w/ refills Pt does not have insurance (until Nov 1) but wants to know  if we have a discount card or if there is another was to write her rx so that it is cheaper. Pt needs to get her  buPROPion (WELLBUTRIN XL) 300 MG 24 hr tablet filled also, but knows its expensive. Pt wants to know any way you can help.  [facility] [address]

## 2014-04-03 NOTE — Telephone Encounter (Signed)
[facility] on machine for [facility]  Asked patient to call back if she would like refills there.

## 2014-04-03 NOTE — Telephone Encounter (Signed)
Rx sent to phramacy 

## 2014-04-03 NOTE — Telephone Encounter (Signed)
[facility] [address] [facility] is the pharmacy

## 2014-05-08 ENCOUNTER — Other Ambulatory Visit: Payer: Self-pay | Admitting: Family Medicine

## 2014-05-08 DIAGNOSIS — E0500 Thyrotoxicosis with diffuse goiter without thyrotoxic crisis or storm: Secondary | ICD-10-CM

## 2014-05-13 ENCOUNTER — Encounter: Payer: Self-pay | Admitting: Internal Medicine

## 2014-05-13 ENCOUNTER — Ambulatory Visit (INDEPENDENT_AMBULATORY_CARE_PROVIDER_SITE_OTHER): Payer: Managed Care, Other (non HMO) | Admitting: Internal Medicine

## 2014-05-13 VITALS — BP 114/62 | HR 79 | Temp 98.6°F | Resp 12 | Ht 69.75 in | Wt 188.0 lb

## 2014-05-13 DIAGNOSIS — E282 Polycystic ovarian syndrome: Secondary | ICD-10-CM

## 2014-05-13 DIAGNOSIS — Z8639 Personal history of other endocrine, nutritional and metabolic disease: Secondary | ICD-10-CM

## 2014-05-13 DIAGNOSIS — E890 Postprocedural hypothyroidism: Secondary | ICD-10-CM

## 2014-05-13 MED ORDER — LEVOTHYROXINE SODIUM 125 MCG PO TABS: 125.0000 ug | ORAL_TABLET | Freq: Every day | ORAL | Status: DC

## 2014-05-13 NOTE — Progress Notes (Signed)
Patient ID: Danielle [name], female   DOB: [birth year], 38 y.o.   MRN: [id number]   HPI  Danielle [name] is a 38 y.o.-year-old female, referred by her PCP, Dr. Tawanna Coolerodd, for management of hypothyroidism, postablative (for Graves Ds).  Pt. has been dx with hypothyroidism in 03/2012 post RAI tx for Graves ds. 05/2012; is on Levothyroxine 125 mcg, taken: - fasting "most of the time"- 2-3 days a week she takes it later as she forgets it and eats breakfast - she can also forget to take some of the doses - with water or juice (cranberry or OJ)  - usually separated by 30-45 min from b'fast  - + multivitamins but not consistently, maybe 2 days a week, in pm - no calcium, iron, PPIs  I reviewed pt's thyroid tests: 05/06/2014: (Dr. Dareen PianoAnderson, ObGyn): TSH 12.58 Lab Results  Component Value Date   TSH 7.83* 11/12/2013   TSH 0.24* 10/08/2013   TSH 0.85 07/03/2013   TSH 12.91* 05/10/2013   TSH 1.87 02/13/2013   TSH 0.04* 01/08/2013   TSH 13.07* 10/24/2012   TSH 15.35* 09/19/2012   TSH 10.46* 08/15/2012   TSH 0.04* 07/04/2012   FREET4 0.18* 08/15/2012   FREET4 1.87* 07/04/2012   FREET4 3.32* 06/12/2012    Pt describes: - + fatigue - + weight gain - + cold intolerance - + constipation - no dry skin - no hair falling - + depression  Pt denies feeling nodules in neck, hoarseness, dysphagia/odynophagia, SOB with lying down.  She has + FH of thyroid disorders in: mother. No FH of thyroid cancer.  No h/o radiation tx to head or neck. No recent use of iodine supplements. Eats seaweed and kelp occasional.  I reviewed her chart and she also has a history of PCOS.   ROS: Constitutional: + weight gain, no fatigue, no subjective hyperthermia/hypothermia, + poor sleep Eyes: no blurry vision, no xerophthalmia ENT: no sore throat, no nodules palpated in throat, no dysphagia/odynophagia,+ hoarseness Cardiovascular: no CP/+ SOB/+ palpitations/no leg swelling Respiratory: no cough/+ SOB Gastrointestinal:  no N/V/D/C Musculoskeletal: no muscle joint aches Skin: no rashes, + excessive hair growth Neurological: no tremors/numbness/tingling/dizziness, + HA Psychiatric: +depression/no anxiety  Past Medical History  Diagnosis Date  . Polycystic ovary syndrome   . HPV in female   . Asthma   . Migraine    Past Surgical History  Procedure Laterality Date  . Cyro     History   Social History  . Marital Status: Married    Spouse Name: N/A    Number of Children:    Occupational History  .    Social History Main Topics  . Smoking status: Former Games developermoker  . Smokeless tobacco: Not on file  . Alcohol Use: Yes  . Drug Use: No  . Sexual Activity: Yes    Birth Control/ Protection: Pill   Current Outpatient Prescriptions on File Prior to Visit  Medication Sig Dispense Refill  . AZURETTE 0.15-0.02/0.01 MG (21/5) tablet     . buPROPion (WELLBUTRIN XL) 300 MG 24 hr tablet Take 1 tablet (300 mg total) by mouth daily. 30 tablet 11  . levothyroxine (SYNTHROID, LEVOTHROID) 125 MCG tablet Take 1 tablet (125 mcg total) by mouth daily. 30 tablet 3  . spironolactone (ALDACTONE) 100 MG tablet Take 1 tablet (100 mg total) by mouth daily. 100 tablet 3  . buPROPion (WELLBUTRIN SR) 100 MG 12 hr tablet Take 1 tablet (100 mg total) by mouth 2 (two) times daily. 60 tablet 11   No  current facility-administered medications on file prior to visit.   Allergies  Allergen Reactions  . Allegra [Fexofenadine Hcl]     Swelling occurs  . Sulfa Antibiotics   . Aspirin Rash    Baby asprin; as a child, rash from dyes    Family History  Problem Relation Age of Onset  . Hyperlipidemia Mother   . Hypertension Mother   . Diabetes Mother   . Hypertension Sister   . Cancer Father     lung tumor/ lung removed   PE: BP 114/62 mmHg  Pulse 79  Temp(Src) 98.6 F (37 C) (Oral)  Resp 12  Ht 5' 9.75" (1.772 m)  Wt 188 lb (85.276 kg)  BMI 27.16 kg/m2  SpO2 98% Wt Readings from Last 3 Encounters:  05/13/14 188 lb  (85.276 kg)  10/08/13 178 lb (80.74 kg)  05/10/13 189 lb (85.73 kg)   Constitutional: overweight, in NAD Eyes: PERRLA, EOMI, no exophthalmos ENT: moist mucous membranes, no thyromegaly, no cervical lymphadenopathy Cardiovascular: RRR, No MRG Respiratory: CTA B Gastrointestinal: abdomen soft, NT, ND, BS+ Musculoskeletal: no deformities, strength intact in all 4 Skin: moist, warm, no rashes Neurological: no tremor with outstretched hands, DTR normal in all 4  ASSESSMENT: 1. Hypothyroidism  2. PCOS  PLAN:  1. Patient with long-standing hypothyroidism, on levothyroxine therapy, but not completely compliant with taking it.  She appears euthyroid. She does not appear to have a goiter, thyroid nodules, or neck compression symptoms - We discussed about correct intake of levothyroxine, fasting, with water, separated by at least 30 minutes from breakfast, and separated by more than 4 hours from calcium, iron, multivitamins, acid reflux medications (PPIs). She is not taking it fasting every day, so I underlined the need to take it every single day on an empty stomach and then to wait at least 30 minutes before she eats. I also advised her to take it with water rather than juice, since juice can be fortified with calcium which will decrease the levothyroxine absorption. She will start taking it correctly. - I refilled the same dose of levothyroxine, 125 g daily - will check thyroid tests a month after she starts taking the levothyroxine correctly: TSH, free T4 - If these are abnormal, she will need to return in 6-8 weeks for repeat labs - If these are normal, I will see her back in 4 months  2. PCOS - We briefly addressed today - She is on oral contraceptives but ran out of spironolactone. She tells me that she did not take it recently because it upsets her stomach.. However, when she took it, it had a good effect on her hirsutism, which is a feature that bothers her most about her PCOS. -  advised her to split the spironolactone in half and take it bid if he decides to restart it - I also advised her for good contraception is on spironolactone due to teratogenic effect

## 2014-05-13 NOTE — Patient Instructions (Signed)
Take the thyroid hormone every day, with water, >30 minutes before breakfast, separated by >4 hours from acid reflux medications, calcium, iron, multivitamins.  Continue the Levothyroxine 125 mcg daily for now.  Come back in 1 month for labs.  Please come back for a follow-up appointment in 4 months.

## 2014-06-06 ENCOUNTER — Telehealth: Payer: Self-pay | Admitting: Family Medicine

## 2014-06-06 DIAGNOSIS — F329 Major depressive disorder, single episode, unspecified: Secondary | ICD-10-CM

## 2014-06-06 DIAGNOSIS — F32A Depression, unspecified: Secondary | ICD-10-CM

## 2014-06-06 DIAGNOSIS — E282 Polycystic ovarian syndrome: Secondary | ICD-10-CM

## 2014-06-06 MED ORDER — LEVOTHYROXINE SODIUM 125 MCG PO TABS: 125.0000 ug | ORAL_TABLET | Freq: Every day | ORAL | Status: DC

## 2014-06-06 MED ORDER — BUPROPION HCL ER (XL) 300 MG PO TB24
300.0000 mg | ORAL_TABLET | Freq: Every day | ORAL | Status: DC
Start: 2014-06-06 — End: 2014-12-05

## 2014-06-06 MED ORDER — SPIRONOLACTONE 100 MG PO TABS: 100.0000 mg | ORAL_TABLET | Freq: Every day | ORAL | Status: DC

## 2014-06-06 MED ORDER — DESOGESTREL-ETHINYL ESTRADIOL 0.15-0.02/0.01 MG (21/5) PO TABS: 1.0000 | ORAL_TABLET | Freq: Every day | ORAL | Status: DC

## 2014-06-06 NOTE — Telephone Encounter (Signed)
Rx sent to pharmacy   

## 2014-06-06 NOTE — Telephone Encounter (Signed)
Pt request refill  levothyroxine (SYNTHROID, LEVOTHROID) 125 MCG tablet AZURETTE 0.15-0.02/0.01 MG (21/5) tablet buPROPion (WELLBUTRIN XL) 300 MG 24 hr tablet spironolactone (ALDACTONE) 100 MG tablet [phone] [employer] [facility]

## 2014-06-12 ENCOUNTER — Other Ambulatory Visit: Payer: Managed Care, Other (non HMO)

## 2014-07-04 ENCOUNTER — Emergency Department (INDEPENDENT_AMBULATORY_CARE_PROVIDER_SITE_OTHER)
Admission: EM | Admit: 2014-07-04 | Discharge: 2014-07-04 | Disposition: A | Discharge status: Home / Self Care | Payer: Managed Care, Other (non HMO) | Source: Home / Self Care | Attending: Family Medicine | Admitting: Family Medicine

## 2014-07-04 ENCOUNTER — Encounter (HOSPITAL_COMMUNITY): Payer: Self-pay | Admitting: Emergency Medicine

## 2014-07-04 DIAGNOSIS — B9789 Other viral agents as the cause of diseases classified elsewhere: Principal | ICD-10-CM

## 2014-07-04 DIAGNOSIS — J069 Acute upper respiratory infection, unspecified: Secondary | ICD-10-CM

## 2014-07-04 HISTORY — DX: Disorder of thyroid, unspecified: E07.9

## 2014-07-04 MED ORDER — AZITHROMYCIN 250 MG PO TABS: 250.0000 mg | ORAL_TABLET | Freq: Every day | ORAL | Status: DC

## 2014-07-04 MED ORDER — IPRATROPIUM BROMIDE 0.06 % NA SOLN: 2.0000 | Freq: Four times a day (QID) | NASAL | Status: DC

## 2014-07-04 MED ORDER — CETIRIZINE HCL 5 MG/5ML PO SYRP: 5.0000 mg | ORAL_SOLUTION | Freq: Every day | ORAL | Status: DC

## 2014-07-04 NOTE — ED Provider Notes (Signed)
CSN: [id number]     Arrival date & time 07/04/14  1324 History   First MD Initiated Contact with Patient 07/04/14 1027     Chief Complaint  Patient presents with  . URI    sore throat, cough, nasal congestion, hoarse, nausea   (Consider location/radiation/quality/duration/timing/severity/associated sxs/prior Treatment) HPI  Runny nose and cough. Started 4 days ago. Also w/ sore throat. Associated w/ intermittent nausea. Tried dayquil w/o benefit. Started Flonase today w/ benefit. Denies fevers, SOB, CP. No change since onset.   Recently found mold in car and personal items.    Past Medical History  Diagnosis Date  . Polycystic ovary syndrome   . HPV in female   . Asthma   . Migraine   . Thyroid disease    Past Surgical History  Procedure Laterality Date  . Cyro     Family History  Problem Relation Age of Onset  . Hyperlipidemia Mother   . Hypertension Mother   . Diabetes Mother   . Hypertension Sister   . Cancer Father     lung tumor/ lung removed   History  Substance Use Topics  . Smoking status: Former Smoker    Quit date: 06/24/2014  . Smokeless tobacco: Not on file  . Alcohol Use: Yes     Comment: ocassional   OB History    Gravida Para Term Preterm AB TAB SAB Ectopic Multiple Living   0 0             Review of Systems Per HPI with all other pertinent systems negative.   Allergies  Allegra; Sulfa antibiotics; and Aspirin  Home Medications   Prior to Admission medications   Medication Sig Start Date End Date Taking? Authorizing Provider  buPROPion (WELLBUTRIN XL) 300 MG 24 hr tablet Take 1 tablet (300 mg total) by mouth daily. 06/06/14  Yes Roderick Pee, MD  desogestrel-ethinyl estradiol (AZURETTE) 0.15-0.02/0.01 MG (21/5) tablet Take 1 tablet by mouth daily. 06/06/14  Yes Roderick Pee, MD  levothyroxine (SYNTHROID, LEVOTHROID) 125 MCG tablet Take 1 tablet (125 mcg total) by mouth daily. 06/06/14  Yes Roderick Pee, MD  spironolactone (ALDACTONE)  100 MG tablet Take 1 tablet (100 mg total) by mouth daily. 06/06/14  Yes Roderick Pee, MD  azithromycin (ZITHROMAX) 250 MG tablet Take 1 tablet (250 mg total) by mouth daily. Take first 2 tablets together, then 1 every day until finished. 07/04/14   Ozella Rocks, MD  buPROPion (WELLBUTRIN SR) 100 MG 12 hr tablet Take 1 tablet (100 mg total) by mouth 2 (two) times daily. 04/03/14   Roderick Pee, MD  cetirizine HCl (ZYRTEC) 5 MG/5ML SYRP Take 5 mLs (5 mg total) by mouth daily. 07/04/14   Ozella Rocks, MD  ipratropium (ATROVENT) 0.06 % nasal spray Place 2 sprays into both nostrils 4 (four) times daily. 07/04/14   Ozella Rocks, MD   BP 126/75 mmHg  Pulse 82  Temp(Src) 97.6 F (36.4 C) (Oral)  Resp 18  SpO2 97%  LMP 07/02/2014 (Exact Date) Physical Exam  Constitutional: She is oriented to person, place, and time. She appears well-developed and well-nourished. No distress.  HENT:  Head: Normocephalic and atraumatic.  Nasal speech   Eyes: EOM are normal. Pupils are equal, round, and reactive to light.  Neck: Normal range of motion.  Cardiovascular: Normal rate and normal heart sounds.   No murmur heard. Pulmonary/Chest: Effort normal and breath sounds normal. No respiratory distress. She has no wheezes.  She has no rales. She exhibits no tenderness.  Abdominal: Soft. She exhibits no distension.  Musculoskeletal: Normal range of motion. She exhibits no tenderness.  Neurological: She is alert and oriented to person, place, and time. No cranial nerve deficit.  Skin: Skin is warm. No rash noted. She is not diaphoretic.  Psychiatric: Her behavior is normal. Judgment and thought content normal.    ED Course  Procedures (including critical care time) Labs Review Labs Reviewed - No data to display  Imaging Review No results found.   MDM   1. Viral URI with cough    Viral uri w/ possible allergic component Continue flonase Start nasal atrovent, mucinex, and zyrtec Remove mold from  car and personal items Start Azitrho if develop fevers adn sinus pain or continues for > 10 days Precautions given and all questions answered  Shelly Flattenavid Merrell, MD Family Medicine 07/04/2014, 10:44 AM     Ozella Rocksavid J Merrell, MD 07/04/14 1044

## 2014-07-04 NOTE — ED Notes (Signed)
Pt states she suffered from a URI all through November but feels she was misdiagnosed, and that she may have had Bronchitis.  She felt better for a few weeks, but she woke up with the same symptoms this past Sunday.  She has a sore throat, cough, nasal congestion and is very hoarse.  She denies fever but did have some slight nausea on Monday and Tuesday.

## 2014-07-04 NOTE — Discharge Instructions (Signed)
You are suffering from a viral infection Please continue using the flonase Please start the nasal atrovent Please continue ibuprofen 600mg  every 6 hours Please start an allergy medicine If you develop fevers adn sinus pain or do not get better in another 4-6 days please start the antibiotics Please also star tthe mucinex to break up mucus

## 2014-07-24 IMAGING — CR DG HIP COMPLETE 2+V*R*
3 series · 3 of 3 positions shown · non-contrast
Comparison: None

CLINICAL DATA: Right hip pain for 6 months

RIGHT HIP - COMPLETE 2+ VIEW

[view not recorded (1 of 3)]
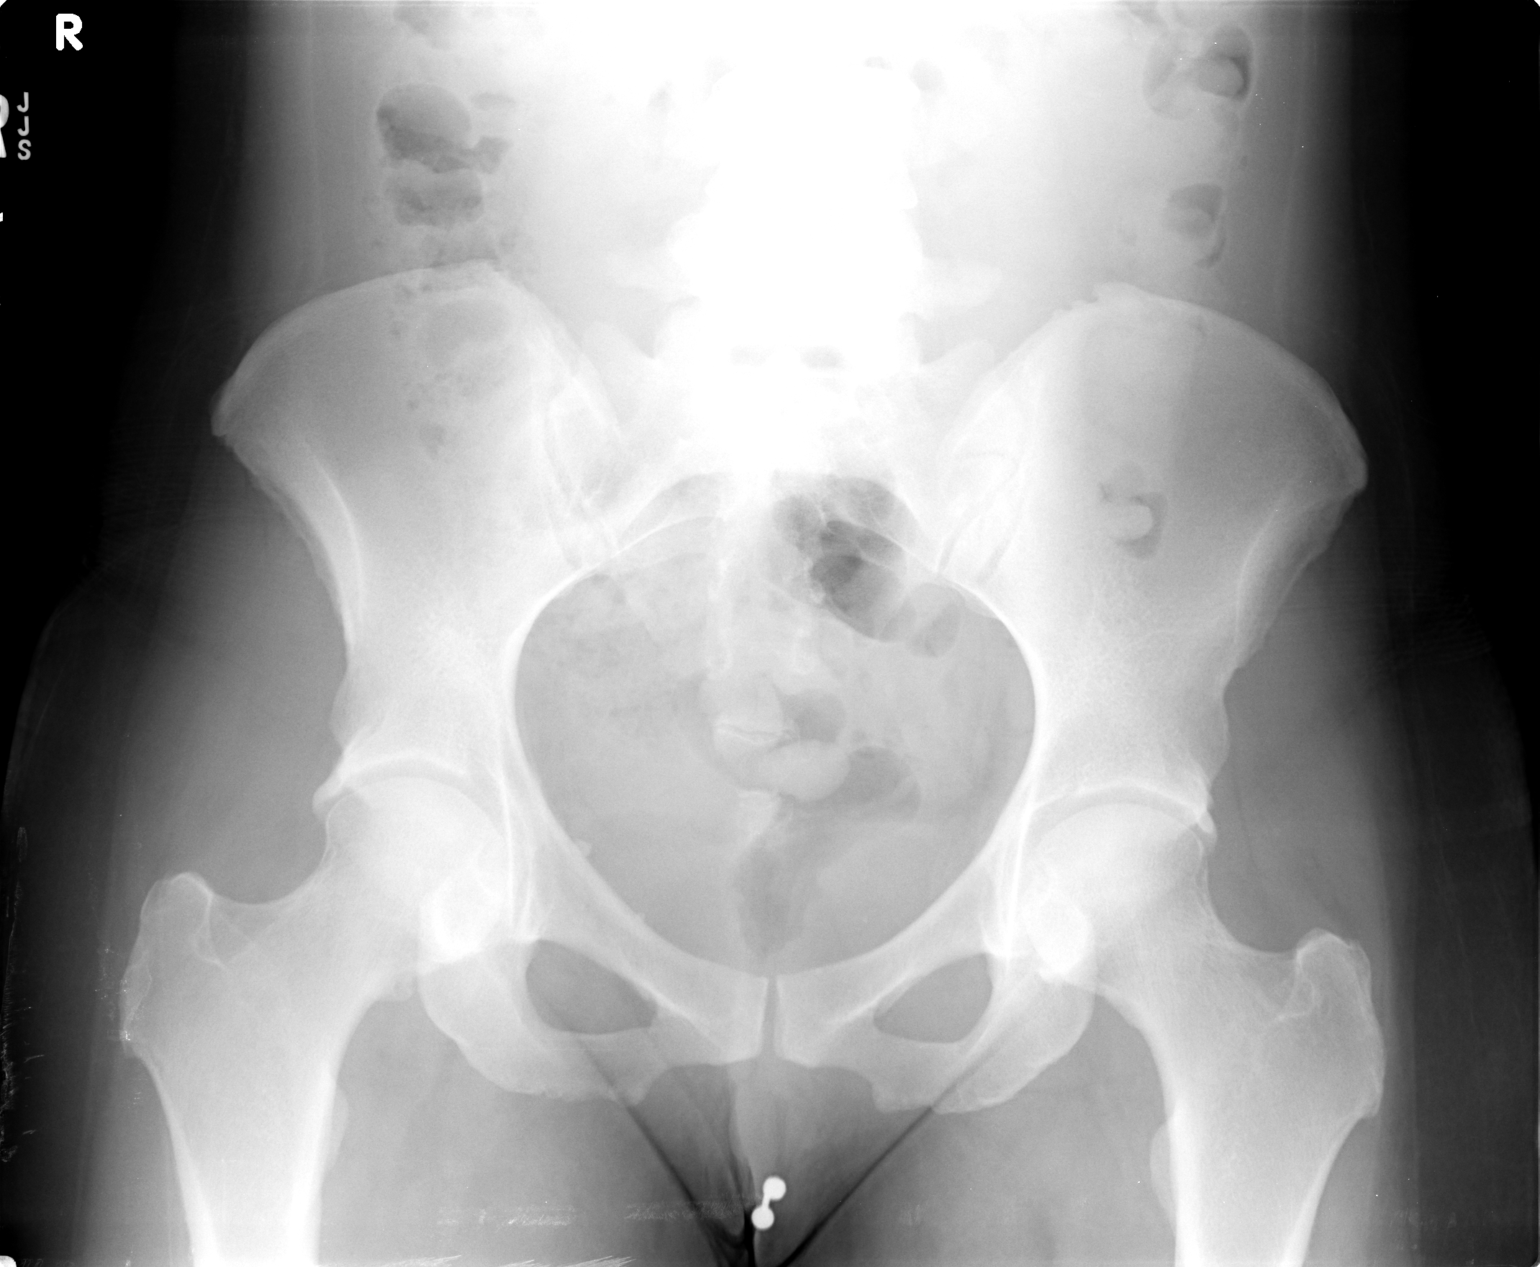

[view not recorded (2 of 3)]
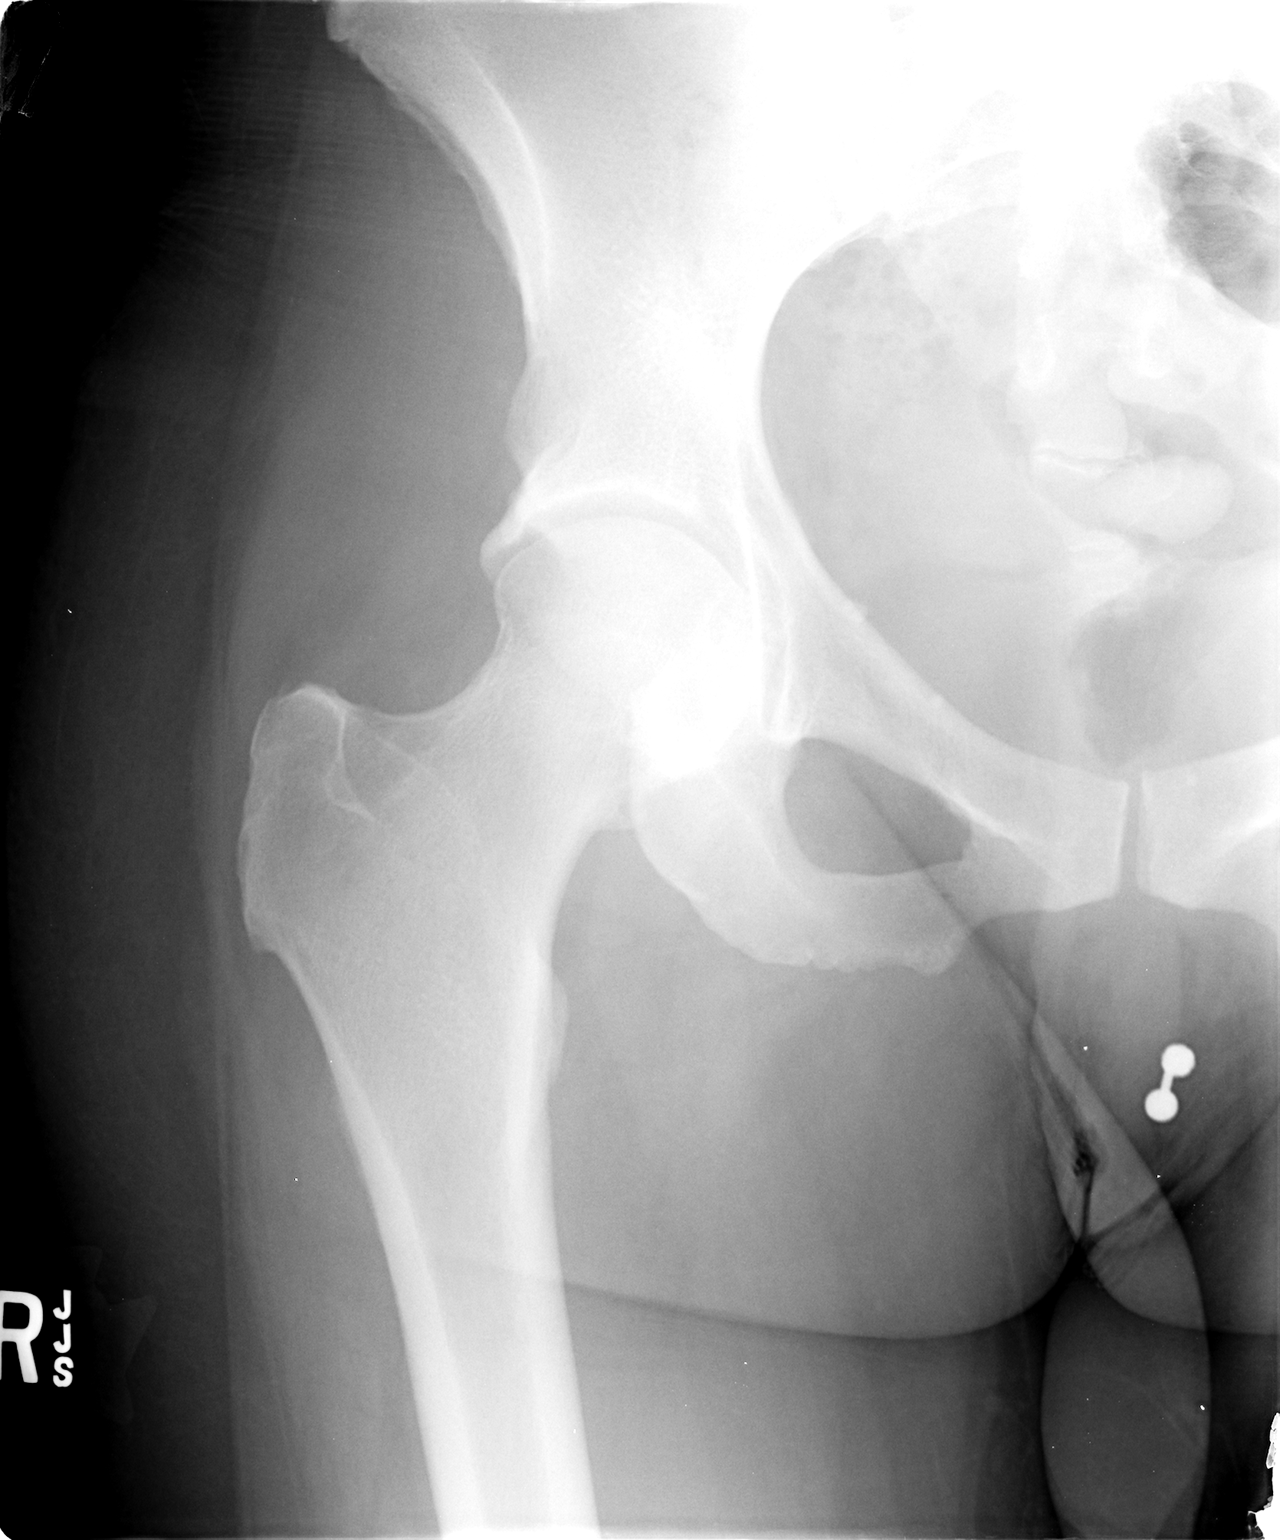

[view not recorded (3 of 3)]
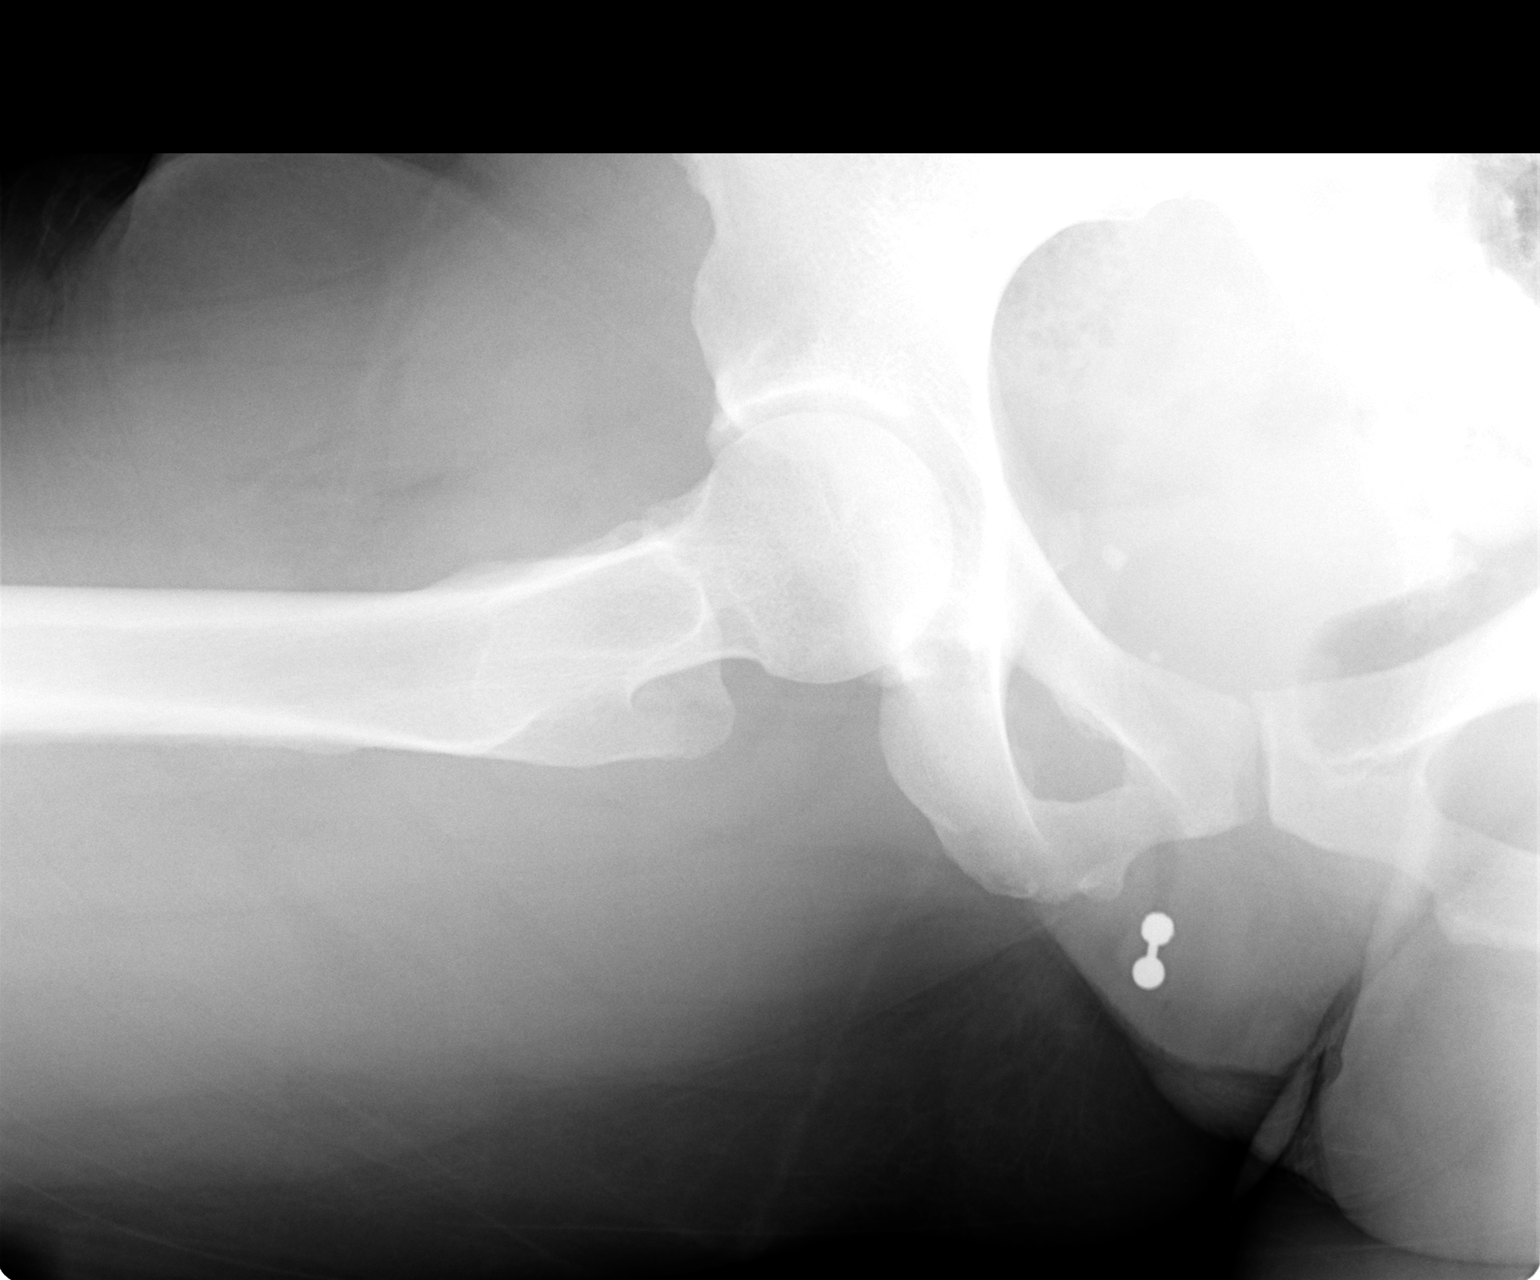

[3 of 3 positions shown; findings below may reference images not displayed]

FINDINGS: Osseous mineralization normal.
Symmetric hip and SI joints.
No acute fracture, dislocation, or bone destruction.
Small pelvic phleboliths.
IMPRESSION: Normal exam.

## 2014-09-05 ENCOUNTER — Encounter: Payer: Self-pay | Admitting: Internal Medicine

## 2014-09-12 ENCOUNTER — Ambulatory Visit: Payer: Managed Care, Other (non HMO) | Admitting: Internal Medicine

## 2014-11-30 ENCOUNTER — Other Ambulatory Visit: Payer: Self-pay | Admitting: Internal Medicine

## 2014-12-04 ENCOUNTER — Telehealth: Payer: Self-pay | Admitting: Family Medicine

## 2014-12-04 ENCOUNTER — Telehealth: Payer: Self-pay | Admitting: Internal Medicine

## 2014-12-04 DIAGNOSIS — F329 Major depressive disorder, single episode, unspecified: Secondary | ICD-10-CM

## 2014-12-04 DIAGNOSIS — F32A Depression, unspecified: Secondary | ICD-10-CM

## 2014-12-04 NOTE — Telephone Encounter (Signed)
Pt needs refill on bupropion 300 mg call into [facility].

## 2014-12-04 NOTE — Telephone Encounter (Signed)
Please call in the levothyroxine to [facility] she is completely out

## 2014-12-05 ENCOUNTER — Other Ambulatory Visit: Payer: Self-pay | Admitting: *Deleted

## 2014-12-05 MED ORDER — LEVOTHYROXINE SODIUM 125 MCG PO TABS: 125.0000 ug | ORAL_TABLET | Freq: Every day | ORAL | Status: DC

## 2014-12-05 MED ORDER — BUPROPION HCL ER (XL) 300 MG PO TB24: 300.0000 mg | ORAL_TABLET | Freq: Every day | ORAL | Status: DC

## 2014-12-05 NOTE — Telephone Encounter (Signed)
[facility] note dated 12/04/14 6:39 pm Pt calling for refill on levothyroxine she is completely out.

## 2014-12-05 NOTE — Telephone Encounter (Signed)
Left message on machine for patient that Rx has been sent

## 2014-12-24 ENCOUNTER — Telehealth: Payer: Self-pay | Admitting: Family Medicine

## 2014-12-24 ENCOUNTER — Other Ambulatory Visit: Payer: Self-pay | Admitting: *Deleted

## 2014-12-24 DIAGNOSIS — F32A Depression, unspecified: Secondary | ICD-10-CM

## 2014-12-24 DIAGNOSIS — F329 Major depressive disorder, single episode, unspecified: Secondary | ICD-10-CM

## 2014-12-24 MED ORDER — BUPROPION HCL ER (XL) 300 MG PO TB24: 300.0000 mg | ORAL_TABLET | Freq: Every day | ORAL | Status: DC

## 2014-12-24 MED ORDER — LEVOTHYROXINE SODIUM 125 MCG PO TABS: 125.0000 ug | ORAL_TABLET | Freq: Every day | ORAL | Status: DC

## 2014-12-24 NOTE — Telephone Encounter (Signed)
Pt request refill buPROPion (WELLBUTRIN SR) 100 MG 12 hr tablet 90 refill  (change of pharm) [facility]

## 2014-12-24 NOTE — Telephone Encounter (Signed)
Pt aware.

## 2014-12-24 NOTE — Telephone Encounter (Signed)
Already done see prior refill note

## 2015-02-16 ENCOUNTER — Other Ambulatory Visit: Payer: Self-pay | Admitting: Family Medicine

## 2015-08-19 ENCOUNTER — Ambulatory Visit (INDEPENDENT_AMBULATORY_CARE_PROVIDER_SITE_OTHER): Payer: Self-pay | Admitting: Adult Health

## 2015-08-19 ENCOUNTER — Other Ambulatory Visit: Payer: Self-pay | Admitting: Adult Health

## 2015-08-19 ENCOUNTER — Encounter: Payer: Self-pay | Admitting: Adult Health

## 2015-08-19 ENCOUNTER — Telehealth: Payer: Self-pay | Admitting: Family Medicine

## 2015-08-19 VITALS — BP 110/64 | Temp 99.1°F | Ht 69.75 in | Wt 185.8 lb

## 2015-08-19 DIAGNOSIS — Z8639 Personal history of other endocrine, nutritional and metabolic disease: Secondary | ICD-10-CM

## 2015-08-19 DIAGNOSIS — F329 Major depressive disorder, single episode, unspecified: Secondary | ICD-10-CM

## 2015-08-19 DIAGNOSIS — F32A Depression, unspecified: Secondary | ICD-10-CM

## 2015-08-19 LAB — T3, FREE: T3, Free: 2.5 pg/mL (ref 2.3–4.2)

## 2015-08-19 LAB — TSH: TSH: 21.68 u[IU]/mL — AB (ref 0.35–4.50)

## 2015-08-19 LAB — T4, FREE: Free T4: 0.54 ng/dL — ABNORMAL LOW (ref 0.60–1.60)

## 2015-08-19 MED ORDER — BUPROPION HCL ER (XL) 300 MG PO TB24: 300.0000 mg | ORAL_TABLET | Freq: Every day | ORAL | Status: DC

## 2015-08-19 MED ORDER — LEVOTHYROXINE SODIUM 125 MCG PO TABS: ORAL_TABLET | ORAL | Status: DC

## 2015-08-19 NOTE — Progress Notes (Signed)
   Subjective:    Patient ID: Danielle [name], female    DOB: 04-22-1976, 40 y.o.   MRN: [id number]  HPI 40 year old female, patient of Dr. Tawanna Cooler, who I am seeing today for hypothyroidism and depression.   She was referred by Dr. Tawanna Cooler to Dr. Elvera Lennox for management of hypothyroidism/ Grave's disease, she last saw Dr. Elvera Lennox 05/13/2014. She has not been back due to her not having insurance. She has a new Health visitor. She seemed confused on who she was supposed to be seeing for her hypothyroidism. She reports that she continues to forget to take some of her doses.   She also reports that she has run out of Wellbutrin about three months ago, again insurance was a factor. Her mother recently had a a stroke and her father was hospitalized due to hyperkalemia. She is feeling more depressed than she usually does and would like a refill of Wellbutrin   Review of Systems  Constitutional: Negative.   Respiratory: Negative.   Cardiovascular: Negative.   Endocrine: Negative.   Musculoskeletal: Negative.   Neurological: Negative.   Psychiatric/Behavioral: Negative.   All other systems reviewed and are negative.      Objective:   Physical Exam  Constitutional: She is oriented to person, place, and time. She appears well-developed and well-nourished. No distress.  Neck: Normal range of motion. Neck supple. No thyromegaly (She appears euthyroid. She does not appear to have a goiter, thyroid nodules, or neck compression symptoms) present.  Cardiovascular: Normal rate, regular rhythm, normal heart sounds and intact distal pulses.  Exam reveals no gallop and no friction rub.   No murmur heard. Musculoskeletal: Normal range of motion. She exhibits no edema or tenderness.  Lymphadenopathy:    She has no cervical adenopathy.  Neurological: She is alert and oriented to person, place, and time.  Skin: Skin is warm and dry. No rash noted. She is not diaphoretic. No erythema. No pallor.  Psychiatric:  She has a normal mood and affect. Her behavior is normal. Judgment and thought content normal.  Nursing note and vitals reviewed.     Assessment & Plan:  1. H/O Graves' disease - Ambulatory referral to Endocrinology - TSH - T4, Free - T3, Free - Will refill synthroid   2. Depression - buPROPion (WELLBUTRIN XL) 300 MG 24 hr tablet; Take 1 tablet (300 mg total) by mouth daily.  Dispense: 90 tablet; Refill: 1

## 2015-08-19 NOTE — Telephone Encounter (Signed)
Left a message for return call.  

## 2015-08-19 NOTE — Progress Notes (Signed)
Pre visit review using our clinic review tool, if applicable. No additional management support is needed unless otherwise documented below in the visit note. 

## 2015-08-19 NOTE — Telephone Encounter (Signed)
Pt call to say she saw her labs on mychart and has some questions about some of the results  And would like a call back

## 2015-08-19 NOTE — Patient Instructions (Signed)
It was great meeting you today!  I have sent in a prescription for Wellbutrin. Take this every day.   I will call you about your thyroid levels and someone will call you to schedule your appointment with Endocrinology.   Make sure you are taking Synthroid every day.

## 2015-08-25 NOTE — Telephone Encounter (Signed)
Called pt again; pt states she picked up her levothyroxine and has started taking it every morning.  Expressed the importance of pt taking this medication every day at the same time and to make sure she does not miss a dose.  Per pt since she has been taking levothyroxine and Wellbutrin her sleep has been "broken" and she is extremely thirsty.  Per Kandee Keen advised that Wellbutrin can cause dry mouth and pt should continue to take her medication and her symptoms should improve. Pt should take both medications in the morning; pt states she is doing that and will continue to monitor her symptoms and call if needed. Pt verbalized understanding.

## 2015-11-05 ENCOUNTER — Encounter: Payer: Self-pay | Admitting: Internal Medicine

## 2015-11-10 ENCOUNTER — Ambulatory Visit (INDEPENDENT_AMBULATORY_CARE_PROVIDER_SITE_OTHER): Payer: BLUE CROSS/BLUE SHIELD | Admitting: Internal Medicine

## 2015-11-10 ENCOUNTER — Encounter: Payer: Self-pay | Admitting: Internal Medicine

## 2015-11-10 VITALS — BP 110/78 | HR 92 | Ht 69.75 in | Wt 183.0 lb

## 2015-11-10 DIAGNOSIS — E890 Postprocedural hypothyroidism: Secondary | ICD-10-CM | POA: Diagnosis not present

## 2015-11-10 DIAGNOSIS — E282 Polycystic ovarian syndrome: Secondary | ICD-10-CM | POA: Diagnosis not present

## 2015-11-10 DIAGNOSIS — Z8639 Personal history of other endocrine, nutritional and metabolic disease: Secondary | ICD-10-CM | POA: Diagnosis not present

## 2015-11-10 LAB — TSH: TSH: 8.27 u[IU]/mL — AB (ref 0.35–4.50)

## 2015-11-10 LAB — T4, FREE: FREE T4: 0.8 ng/dL (ref 0.60–1.60)

## 2015-11-10 MED ORDER — DESOGESTREL-ETHINYL ESTRADIOL 0.15-0.02/0.01 MG (21/5) PO TABS: 1.0000 | ORAL_TABLET | Freq: Every day | ORAL | Status: DC

## 2015-11-10 NOTE — Progress Notes (Signed)
Patient ID: Danielle [name], female   DOB: 02-04-1976, 40 y.o.   MRN: [id number]   HPI  Danielle [name] is a 40 y.o.-year-old female, returning for f/u for postablative hypothyroidism (after RAI tx for Graves Ds). Last visit 04/2014.  Pt. developed hypothyroidism in 03/2012 after RAI tx for Graves ds. 05/2012.  She has had fluctuating TFTs since her ablation, mostly 2/2 incomplete compliance with her thyroid hh.   She is on Levothyroxine 125 mcg, taken: - fasting "most of the time"  - with water or tea - usually separated by 30-45 min from b'fast  - of multivitamins, calcium, iron, PPIs  I reviewed pt's thyroid tests - out of Levothyroxine for 2.5 months: Lab Results  Component Value Date   TSH 21.68* 08/19/2015   TSH 7.83* 11/12/2013   TSH 0.24* 10/08/2013   TSH 0.85 07/03/2013   TSH 12.91* 05/10/2013   TSH 1.87 02/13/2013   TSH 0.04* 01/08/2013   TSH 13.07* 10/24/2012   TSH 15.35* 09/19/2012   TSH 10.46* 08/15/2012   FREET4 0.54* 08/19/2015   FREET4 0.18* 08/15/2012   FREET4 1.87* 07/04/2012   FREET4 3.32* 06/12/2012  05/06/2014: (Dr. Dareen Piano, ObGyn): TSH 12.58  Pt describes: - + fatigue - + weight gain - + cold and heat intolerance - + constipation - no dry skin - no hair falling - + depression. On Wellbutrin for last 2x - "most days does not work".  Pt denies feeling nodules in neck, hoarseness, dysphagia/odynophagia, SOB with lying down.  She has + FH of thyroid disorders in: mother. No FH of thyroid cancer.  No h/o radiation tx to head or neck. No recent use of iodine supplements. Eats seaweed and kelp occasional.  I reviewed her chart and she also has a history of PCOS.   She will get married later this year.  ROS: Constitutional: + see HPI, + poor sleep Eyes: + blurry vision, no xerophthalmia ENT: no sore throat, no nodules palpated in throat, no dysphagia/odynophagia, no hoarseness Cardiovascular: no CP/SOB/palpitations/+ leg swelling Respiratory: no  cough/SOB Gastrointestinal: no N/V/D/+ C Musculoskeletal: + muscle/no joint aches Skin: no rashes, + excessive hair growth Neurological: no tremors/numbness/tingling/dizziness + low libido  I reviewed pt's medications, allergies, PMH, social hx, family hx, and changes were documented in the history of present illness. Otherwise, unchanged from my initial visit note.  Past Medical History  Diagnosis Date  . Polycystic ovary syndrome   . HPV in female   . Asthma   . Migraine   . Thyroid disease    Past Surgical History  Procedure Laterality Date  . Cyro     History   Social History  . Marital Status: Married    Spouse Name: N/A    Number of Children: 0   Occupational History  .    Social History Main Topics  . Smoking status: Former Games developer  . Smokeless tobacco: Not on file  . Alcohol Use: Yes  . Drug Use: No  . Sexual Activity: Yes    Birth Control/ Protection: Pill   Current Outpatient Prescriptions on File Prior to Visit  Medication Sig Dispense Refill  . buPROPion (WELLBUTRIN XL) 300 MG 24 hr tablet Take 1 tablet (300 mg total) by mouth daily. 90 tablet 1  . cetirizine HCl (ZYRTEC) 5 MG/5ML SYRP Take 5 mLs (5 mg total) by mouth daily. 300 mL 0  . desogestrel-ethinyl estradiol (AZURETTE) 0.15-0.02/0.01 MG (21/5) tablet Take 1 tablet by mouth daily. 5 Package 1  . ipratropium (ATROVENT) 0.06 %  nasal spray Place 2 sprays into both nostrils 4 (four) times daily. 15 mL 12  . levothyroxine (SYNTHROID, LEVOTHROID) 125 MCG tablet Take 1 tablet by mouth  daily 90 tablet 1  . spironolactone (ALDACTONE) 100 MG tablet Take 1 tablet (100 mg total) by mouth daily. 90 tablet 1   No current facility-administered medications on file prior to visit.   Allergies  Allergen Reactions  . Allegra [Fexofenadine Hcl]     Swelling occurs  . Sulfa Antibiotics   . Aspirin Rash    Baby asprin; as a child, rash from dyes    Family History  Problem Relation Age of Onset  .  Hyperlipidemia Mother   . Hypertension Mother   . Diabetes Mother   . Hypertension Sister   . Cancer Father     lung tumor/ lung removed   PE: BP 110/78 mmHg  Pulse 92  Ht 5' 9.75" (1.772 m)  Wt 183 lb (83.008 kg)  BMI 26.44 kg/m2  LMP 11/03/2015 Wt Readings from Last 3 Encounters:  11/10/15 183 lb (83.008 kg)  08/19/15 185 lb 12.8 oz (84.278 kg)  05/13/14 188 lb (85.276 kg)   Constitutional: overweight, in NAD Eyes: PERRLA, EOMI, no exophthalmos ENT: moist mucous membranes, + slight symmetric thyromegaly, no cervical lymphadenopathy Cardiovascular: RRR, No MRG Respiratory: CTA B Gastrointestinal: abdomen soft, NT, ND, BS+ Musculoskeletal: no deformities, strength intact in all 4 Skin: moist, warm, no rashes, + dark coarse hair shafts on chin  Neurological: no tremor with outstretched hands, DTR normal in all 4  ASSESSMENT: 1. Postablative Hypothyroidism - after RAI tx for Graves ds.  2. PCOS  PLAN:  1. Patient with long-standing hypothyroidism, on levothyroxine therapy, but not completely compliant with taking the med as advised. - She appears euthyroid now, but has weight gain (weight gain per our scale), fatigue, irritability.  - she tells me she is thinking about getting pregnant next year >> strongly advised her that we need a TSH <2.5 for a safe pregnancy - We discussed about correct intake of levothyroxine, fasting, with water, separated by at least 30 minutes from breakfast, and separated by more than 4 hours from calcium, iron, multivitamins, acid reflux medications (PPIs). She is not taking it fasting every day, so I underlined the need to take it every single day on an empty stomach and then to wait at least 30 minutes before she eats.  - for now continue the same dose of levothyroxine, 125 g daily - will check thyroid tests today: TSH, free T4 - If these are abnormal, she will need to return in 6 weeks for repeat labs - If these are normal, I will see her back  in 6 months  2. PCOS - She is on oral contraceptives (refilled today) but ran out of spironolactone before last visit and did not restart it. She was telling me then that she stopped taking it also because it upset her stomach. However, when she took it, it had a good effect on her hirsutism, which is a feature that bothers her most about her PCOS. - for now, continue oral contraception - We discussed that she may need to see high risk OB if she gets pregnant or may need fertility evaluation and treatment if she has difficulty getting pregnant  Office Visit on 11/10/2015  Component Date Value Ref Range Status  . Free T4 11/10/2015 0.80  0.60 - 1.60 ng/dL Final  . TSH 78/29/562105/15/2017 8.27* 0.35 - 4.50 uIU/mL Final   TSH  is improved. I advised the patient take the Synthroid every single morning, fasting, and we'll recheck her TFTs in 5-6 weeks.

## 2015-11-10 NOTE — Patient Instructions (Signed)
Please stop at the lab.  Please come back for a follow-up appointment in 6 months.  

## 2015-12-15 ENCOUNTER — Encounter: Payer: Self-pay | Admitting: Internal Medicine

## 2016-02-16 ENCOUNTER — Encounter: Payer: Self-pay | Admitting: Adult Health

## 2016-03-05 ENCOUNTER — Encounter: Payer: Self-pay | Admitting: Adult Health

## 2016-03-05 ENCOUNTER — Ambulatory Visit (INDEPENDENT_AMBULATORY_CARE_PROVIDER_SITE_OTHER): Payer: BLUE CROSS/BLUE SHIELD | Admitting: Adult Health

## 2016-03-05 VITALS — BP 130/86 | Temp 98.3°F | Wt 181.8 lb

## 2016-03-05 DIAGNOSIS — F32A Depression, unspecified: Secondary | ICD-10-CM

## 2016-03-05 DIAGNOSIS — E039 Hypothyroidism, unspecified: Secondary | ICD-10-CM | POA: Diagnosis not present

## 2016-03-05 DIAGNOSIS — F329 Major depressive disorder, single episode, unspecified: Secondary | ICD-10-CM

## 2016-03-05 LAB — TSH: TSH: 1.4 u[IU]/mL (ref 0.35–4.50)

## 2016-03-05 LAB — T4, FREE: Free T4: 1.34 ng/dL (ref 0.60–1.60)

## 2016-03-05 LAB — T3, FREE: T3, Free: 3.1 pg/mL (ref 2.3–4.2)

## 2016-03-05 MED ORDER — CITALOPRAM HYDROBROMIDE 20 MG PO TABS: 20.0000 mg | ORAL_TABLET | Freq: Every day | ORAL | 3 refills | Status: DC

## 2016-03-05 NOTE — Progress Notes (Signed)
Pre visit review using our clinic review tool, if applicable. No additional management support is needed unless otherwise documented below in the visit note. 

## 2016-03-05 NOTE — Progress Notes (Signed)
Subjective:    Patient ID: Danielle [name], female    DOB: 09-29-1975, 40 y.o.   MRN: [id number]  HPI  40 year old female who presents to the office to talk about anxiety/depression medication. She reports being on Wellbutrin since 2014. As of recently ( ober the last month) she feels as though her moods have been up and down, she no longer has any sympathy , she goes through periods of depression and anxiety and her sex drive has tanked.   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   She is trying to eat healthy but is not exercising.   She denies any SI or self harm   Review of Systems  Constitutional: Negative.   Respiratory: Negative.   Genitourinary: Negative.   Neurological: Negative.   Psychiatric/Behavioral: Positive for agitation, behavioral problems, decreased concentration and sleep disturbance. Negative for confusion, dysphoric mood, hallucinations and suicidal ideas. The patient is nervous/anxious. The patient is not hyperactive.        Depression.  Decreased sex drive  All other systems reviewed and are negative.  Past Medical History:  Diagnosis Date  . Asthma   . HPV in female   . Migraine   . Polycystic ovary syndrome   . Thyroid disease     Social History   Social History  . Marital status: Married    Spouse name: N/A  . Number of children: N/A  . Years of education: N/A   Occupational History  . Not on file.   Social History Main Topics  . Smoking status: Current Some Day Smoker    Last attempt to quit: 06/24/2014  . Smokeless tobacco: Never Used  . Alcohol use 0.0 oz/week     Comment: ocassional  . Drug use: No  . Sexual activity: Yes    Birth control/ protection: Pill   Other Topics Concern  . Not on file   Social History Narrative  . No narrative on file    Past Surgical History:  Procedure Laterality Date  . cyro      Family History  Problem Relation Age  of Onset  . Hyperlipidemia Mother   . Hypertension Mother   . Diabetes Mother   . Hypertension Sister   . Cancer Father     lung tumor/ lung removed    Allergies  Allergen Reactions  . Allegra [Fexofenadine Hcl]     Swelling occurs  . Sulfa Antibiotics   . Aspirin Rash    Baby asprin; as a child, rash from dyes     Current Outpatient Prescriptions on File Prior to Visit  Medication Sig Dispense Refill  . levothyroxine (SYNTHROID, LEVOTHROID) 125 MCG tablet Take 1 tablet by mouth  daily 90 tablet 1  . cetirizine HCl (ZYRTEC) 5 MG/5ML SYRP Take 5 mLs (5 mg total) by mouth daily. (Patient not taking: Reported on 03/05/2016) 300 mL 0  . desogestrel-ethinyl estradiol (AZURETTE) 0.15-0.02/0.01 MG (21/5) tablet Take 1 tablet by mouth daily. (Patient not taking: Reported on 03/05/2016) 6 Package 1  . ipratropium (ATROVENT) 0.06 % nasal spray Place 2 sprays into both nostrils 4 (four) times daily. (Patient not taking: Reported on 03/05/2016) 15 mL 12   No current facility-administered medications on file prior to visit.     BP 130/86 (BP Location: Left Arm, Patient Position: Sitting, Cuff Size: Normal)   Temp 98.3 F (36.8 C) (Oral)   Wt 181  lb 12.8 oz (82.5 kg)   LMP 02/19/2016 (Approximate)   BMI 26.27 kg/m       Objective:   Physical Exam  Constitutional: She is oriented to person, place, and time. She appears well-developed and well-nourished. No distress.  Cardiovascular: Normal rate, regular rhythm, normal heart sounds and intact distal pulses.  Exam reveals no gallop.   No murmur heard. Pulmonary/Chest: Effort normal and breath sounds normal. No respiratory distress. She has no wheezes. She has no rales.  Musculoskeletal: Normal range of motion.  Neurological: She is alert and oriented to person, place, and time. She has normal reflexes.  Skin: Skin is warm and dry. No rash noted. She is not diaphoretic. No erythema. No pallor.  Psychiatric: She has a normal mood and affect.  Her behavior is normal. Thought content normal.  Nursing note and vitals reviewed.     Assessment & Plan:   1. Hypothyroidism, unspecified hypothyroidism type - TSH - T3, Free - T4, Free - Will adjust synthroid if needed 2. Depression - Will D/ c Wellbutrin and start on Celexa. She was advised that this can affect her sex drive as well.  - I would like her to get into the gym and start working out. This can help with anxiety/depression/mood/sex drive - citalopram (CELEXA) 20 MG tablet; Take 1 tablet (20 mg total) by mouth daily.  Dispense: 30 tablet; Refill: 3 - Follow up in 2 weeks - Go to the ER with any SI   Shirline Freesory Oneda Duffett, NP

## 2016-03-05 NOTE — Patient Instructions (Signed)
It was great seeing you you! I will follow up with you regarding your blood work. Stop Wellbutrin and start Celexa.   Follow up with me in 2 weeks or sooner if needed

## 2016-03-19 ENCOUNTER — Encounter: Payer: Self-pay | Admitting: Adult Health

## 2016-03-19 ENCOUNTER — Ambulatory Visit (INDEPENDENT_AMBULATORY_CARE_PROVIDER_SITE_OTHER): Payer: BLUE CROSS/BLUE SHIELD | Admitting: Adult Health

## 2016-03-19 VITALS — BP 110/76 | Temp 98.2°F | Ht 69.75 in | Wt 183.5 lb

## 2016-03-19 DIAGNOSIS — F419 Anxiety disorder, unspecified: Principal | ICD-10-CM

## 2016-03-19 DIAGNOSIS — F418 Other specified anxiety disorders: Secondary | ICD-10-CM

## 2016-03-19 DIAGNOSIS — F32A Depression, unspecified: Secondary | ICD-10-CM

## 2016-03-19 DIAGNOSIS — F329 Major depressive disorder, single episode, unspecified: Secondary | ICD-10-CM

## 2016-03-19 NOTE — Progress Notes (Signed)
Subjective:    Patient ID: Danielle Rasheriffany [name], female    DOB: [birth year], 40 y.o.   MRN: [id number]  HPI  40 year old female who presents to the office today for two week follow up relating to anxiety and depression. During her last visit wellbutrin was stopped and she was started on Celexa. She was asked to start working out and/or walking in the evenings.   Today in the office she reports that she is feeling " much better but not perfect" since starting on Celexa. She still has periods of anxiety and depression about the stressors in her life but feels as though that is getting better.   She has not started exercising yet  She denies any side effects with Celexa   Review of Systems  Constitutional: Negative.   Respiratory: Negative.   Cardiovascular: Negative.   Gastrointestinal: Negative.   Genitourinary: Negative.   Neurological: Negative.   Psychiatric/Behavioral: Negative for behavioral problems, confusion, self-injury, sleep disturbance and suicidal ideas. The patient is nervous/anxious.   All other systems reviewed and are negative.  Past Medical History:  Diagnosis Date  . Asthma   . HPV in female   . Migraine   . Polycystic ovary syndrome   . Thyroid disease     Social History   Social History  . Marital status: Married    Spouse name: N/A  . Number of children: N/A  . Years of education: N/A   Occupational History  . Not on file.   Social History Main Topics  . Smoking status: Current Some Day Smoker    Last attempt to quit: 06/24/2014  . Smokeless tobacco: Never Used  . Alcohol use 0.0 oz/week     Comment: ocassional  . Drug use: No  . Sexual activity: Yes    Birth control/ protection: Pill   Other Topics Concern  . Not on file   Social History Narrative  . No narrative on file    Past Surgical History:  Procedure Laterality Date  . cyro      Family History  Problem Relation Age of Onset  . Hyperlipidemia Mother   . Hypertension Mother     . Diabetes Mother   . Hypertension Sister   . Cancer Father     lung tumor/ lung removed    Allergies  Allergen Reactions  . Allegra [Fexofenadine Hcl]     Swelling occurs  . Sulfa Antibiotics   . Aspirin Rash    Baby asprin; as a child, rash from dyes     Current Outpatient Prescriptions on File Prior to Visit  Medication Sig Dispense Refill  . cetirizine HCl (ZYRTEC) 5 MG/5ML SYRP Take 5 mLs (5 mg total) by mouth daily. 300 mL 0  . citalopram (CELEXA) 20 MG tablet Take 1 tablet (20 mg total) by mouth daily. 30 tablet 3  . desogestrel-ethinyl estradiol (AZURETTE) 0.15-0.02/0.01 MG (21/5) tablet Take 1 tablet by mouth daily. 6 Package 1  . ipratropium (ATROVENT) 0.06 % nasal spray Place 2 sprays into both nostrils 4 (four) times daily. 15 mL 12  . levothyroxine (SYNTHROID, LEVOTHROID) 125 MCG tablet Take 1 tablet by mouth  daily 90 tablet 1   No current facility-administered medications on file prior to visit.     BP 110/76   Temp 98.2 F (36.8 C) (Oral)   Ht 5' 9.75" (1.772 m)   Wt 183 lb 8 oz (83.2 kg)   LMP 02/19/2016 (Approximate)   BMI 26.52 kg/m  Objective:   Physical Exam  Constitutional: She is oriented to person, place, and time. She appears well-developed and well-nourished. No distress.  Cardiovascular: Normal rate, regular rhythm, normal heart sounds and intact distal pulses.  Exam reveals no gallop and no friction rub.   No murmur heard. Pulmonary/Chest: Effort normal and breath sounds normal. No respiratory distress. She has no wheezes. She has no rales. She exhibits no tenderness.  Neurological: She is alert and oriented to person, place, and time.  Skin: Skin is warm and dry. No rash noted. She is not diaphoretic. No erythema. No pallor.  Psychiatric: She has a normal mood and affect. Her behavior is normal. Judgment and thought content normal.  Nursing note and vitals reviewed.     Assessment & Plan:  1. Anxiety and depression - Continue with  Celexa - Get into the gym and start exercising  - Follow up as needed - Go to the ER with any thoughts of SI or self harm   Shirline Frees

## 2016-04-22 ENCOUNTER — Other Ambulatory Visit: Payer: Self-pay

## 2016-04-22 ENCOUNTER — Telehealth: Payer: Self-pay | Admitting: Family Medicine

## 2016-04-22 DIAGNOSIS — F329 Major depressive disorder, single episode, unspecified: Secondary | ICD-10-CM

## 2016-04-22 DIAGNOSIS — F32A Depression, unspecified: Secondary | ICD-10-CM

## 2016-04-22 MED ORDER — CITALOPRAM HYDROBROMIDE 20 MG PO TABS: 20.0000 mg | ORAL_TABLET | Freq: Every day | ORAL | 1 refills | Status: DC

## 2016-04-22 NOTE — Telephone Encounter (Signed)
Rx has been refilled.  

## 2016-04-22 NOTE — Telephone Encounter (Signed)
OK to refill

## 2016-04-22 NOTE — Telephone Encounter (Signed)
Ok to refill for 90 +1 

## 2016-04-22 NOTE — Telephone Encounter (Signed)
° ° °  Pt said the following med is working for her and she is asking for a 90 day supply   citalopram (CELEXA) 20 MG tablet    [facility] [address]

## 2016-05-13 ENCOUNTER — Encounter: Payer: Self-pay | Admitting: Internal Medicine

## 2016-05-13 ENCOUNTER — Ambulatory Visit (INDEPENDENT_AMBULATORY_CARE_PROVIDER_SITE_OTHER): Payer: BLUE CROSS/BLUE SHIELD | Admitting: Internal Medicine

## 2016-05-13 VITALS — BP 102/72 | HR 92 | Wt 183.6 lb

## 2016-05-13 DIAGNOSIS — E282 Polycystic ovarian syndrome: Secondary | ICD-10-CM

## 2016-05-13 DIAGNOSIS — E890 Postprocedural hypothyroidism: Secondary | ICD-10-CM

## 2016-05-13 MED ORDER — LEVOTHYROXINE SODIUM 125 MCG PO TABS: ORAL_TABLET | ORAL | 3 refills | Status: DC

## 2016-05-13 NOTE — Patient Instructions (Addendum)
Please discuss with Dr. Dareen PianoAnderson about possibly seeing: - reproductive endocrinology - high risk ObGyn  Please come back for labs in 2 months.  Please continue: - Levothyroxine 125 mcg daily  Take the thyroid hormone every day, with water, at least 30 minutes before breakfast, separated by at least 4 hours from: - acid reflux medications - calcium - iron - multivitamins  Please come back for a follow-up appointment in 6 months.

## 2016-05-13 NOTE — Progress Notes (Signed)
Patient ID: Danielle [name], female   DOB: [birth year], 40 y.o.   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 Last visit 6 mo ago.  Hypothyroidism: She has had fluctuating TFTs since her ablation, mostly 2/2 incomplete compliance with her thyroid hh.   She is on Levothyroxine 125 mcg, taken: - every day  - missed 2-3 when she ran out last month - fasting - with water or tea - usually separated by 30-45 min from b'fast  - of multivitamins, calcium, iron, PPIs  I reviewed pt's thyroid tests - normal on the same dose of levothyroxine as before, after starting to take it every day: Lab Results  Component Value Date   TSH 1.40 03/05/2016   TSH 8.27 (H) 11/10/2015   TSH 21.68 (H) 08/19/2015   TSH 7.83 (H) 11/12/2013   TSH 0.24 (L) 10/08/2013   TSH 0.85 07/03/2013   TSH 12.91 (H) 05/10/2013   TSH 1.87 02/13/2013   TSH 0.04 (L) 01/08/2013   TSH 13.07 (H) 10/24/2012   FREET4 1.34 03/05/2016   FREET4 0.80 11/10/2015   FREET4 0.54 (L) 08/19/2015   FREET4 0.18 (L) 08/15/2012   FREET4 1.87 (H) 07/04/2012   FREET4 3.32 (H) 06/12/2012  05/06/2014: (Dr. Dareen PianoAnderson, ObGyn): TSH 12.58  Pt feels well with minimal fatigue. She does have some weight gain, and she complains of heavy menstrual cycles, as described below.  Pt denies feeling nodules in neck, hoarseness, dysphagia/odynophagia, SOB with lying down.  She has + FH of thyroid disorders in: mother. No FH of thyroid cancer.  No h/o radiation tx to head or neck. No recent use of iodine supplements. Eats seaweed and kelp occasional.  She has PCOS: - off OCPs now >> stopped ~ 6 mo ago >> She got married recently >> would like to get pregnant next year - menses are regular but + migraines, heavy bleeding x3-4 days, dysmenorrhea  ObGyn: Dr. Leone PayorMarc Anderson.  She started Celexa since last visit.  ROS: Constitutional:  + see HPI, + fatigue Eyes: no blurry vision, no xerophthalmia ENT: no sore throat, no nodules palpated in throat, no dysphagia/odynophagia, no hoarseness Cardiovascular: no CP/SOB/palpitations/leg swelling Respiratory: + cough/no SOB Gastrointestinal: no N/V/D/C Musculoskeletal: no muscle/no joint aches Skin: no rashes, + excessive hair growth Neurological: no tremors/numbness/tingling/dizziness  I reviewed pt's medications, allergies, PMH, social hx, family hx, and changes were documented in the history of present illness. Otherwise, unchanged from my initial visit note.  Past Medical History:  Diagnosis Date  . Asthma   . HPV in female   . Migraine   . Polycystic ovary syndrome   . Thyroid disease    Past Surgical History:  Procedure Laterality Date  . cyro     History   Social History  . Marital Status: Married    Spouse Name: N/A    Number of Children: 0   Occupational History  .    Social History Main Topics  . Smoking status: Former Games developermoker  . Smokeless tobacco: Not on file  . Alcohol Use: Yes  . Drug Use: No  . Sexual Activity: Yes    Birth Control/ Protection: Pill   Current Outpatient Prescriptions on File Prior to Visit  Medication Sig Dispense Refill  . cetirizine HCl (ZYRTEC) 5 MG/5ML SYRP Take 5 mLs (5 mg total) by mouth daily. 300 mL 0  . citalopram (CELEXA) 20  MG tablet Take 1 tablet (20 mg total) by mouth daily. 90 tablet 1  . desogestrel-ethinyl estradiol (AZURETTE) 0.15-0.02/0.01 MG (21/5) tablet Take 1 tablet by mouth daily. 6 Package 1  . ipratropium (ATROVENT) 0.06 % nasal spray Place 2 sprays into both nostrils 4 (four) times daily. 15 mL 12  . levothyroxine (SYNTHROID, LEVOTHROID) 125 MCG tablet Take 1 tablet by mouth  daily 90 tablet 1   No current facility-administered medications on file prior to visit.    Allergies  Allergen Reactions  . Allegra [Fexofenadine Hcl]     Swelling occurs  . Sulfa Antibiotics   . Aspirin Rash    Baby  asprin; as a child, rash from dyes    Family History  Problem Relation Age of Onset  . Hyperlipidemia Mother   . Hypertension Mother   . Diabetes Mother   . Hypertension Sister   . Cancer Father     lung tumor/ lung removed   PE: BP 102/72   Pulse 92   Wt 183 lb 9.6 oz (83.3 kg)   LMP 05/03/2016   SpO2 97%   BMI 26.53 kg/m  Wt Readings from Last 3 Encounters:  05/13/16 183 lb 9.6 oz (83.3 kg)  03/19/16 183 lb 8 oz (83.2 kg)  03/05/16 181 lb 12.8 oz (82.5 kg)   Constitutional: overweight, in NAD Eyes: PERRLA, EOMI, no exophthalmos ENT: moist mucous membranes, No thyromegaly, no cervical lymphadenopathy Cardiovascular: RRR, No MRG Respiratory: CTA B Gastrointestinal: abdomen soft, NT, ND, BS+ Musculoskeletal: no deformities, strength intact in all 4 Skin: moist, warm, no rashes, + dark coarse hair shafts on chin  Neurological: no tremor with outstretched hands, DTR normal in all 4  ASSESSMENT: 1. Postablative Hypothyroidism - after RAI tx for Graves ds.  2. PCOS  PLAN:  1. Patient with long-standing hypothyroidism, on levothyroxine therapy, but not completely compliant with taking the med as advised. - She appears euthyroid, has slight weight gain and minimum fatigue - Reviewed TSH from last visit, and this was at goal, improved from before despite not changing the dose, after she started to take the medication daily. However, since last visit, she mentions that she still missed several doses as she ran out of the medication. I strongly advised her not to do that in the future. We will wait 2 months before rechecking her thyroid tests again. - she is thinking about getting pregnant next year >>  we need a TSH <2.5 for a safe pregnancy - We discussed about correct intake of levothyroxine, fasting, with water, separated by at least 30 minutes from breakfast, and separated by more than 4 hours from calcium, iron, multivitamins, acid reflux medications (PPIs). She is taking it  correctly. - for now continue the same dose of levothyroxine, 125 g daily - I will see her back in 6 months  2. PCOS - She is now off oral contraceptives and has heavy menstrual cycles. We cannot restart the OCP since she is trying to get pregnant. Because of age and her diagnosis of PCOS, I advised her to discuss with her OB/GYN doctor whether he agrees with referral to reproductive endocrinology or high risk OB. - prev. on spironolactone >> AP >> now off.  Advised her that we cannot use this unless she is on OCPs.   No orders of the defined types were placed in this encounter.   Carlus Pavlovristina Kady Toothaker, MD PhD [facility]

## 2016-07-13 ENCOUNTER — Other Ambulatory Visit (INDEPENDENT_AMBULATORY_CARE_PROVIDER_SITE_OTHER): Payer: BLUE CROSS/BLUE SHIELD

## 2016-07-13 DIAGNOSIS — E890 Postprocedural hypothyroidism: Secondary | ICD-10-CM

## 2016-07-13 LAB — TSH: TSH: 4.67 u[IU]/mL — AB (ref 0.35–4.50)

## 2016-07-13 LAB — T4, FREE: FREE T4: 0.92 ng/dL (ref 0.60–1.60)

## 2016-10-27 ENCOUNTER — Ambulatory Visit (INDEPENDENT_AMBULATORY_CARE_PROVIDER_SITE_OTHER): Payer: 59 | Admitting: Family Medicine

## 2016-10-27 ENCOUNTER — Encounter: Payer: Self-pay | Admitting: Family Medicine

## 2016-10-27 ENCOUNTER — Telehealth: Payer: Self-pay

## 2016-10-27 VITALS — BP 110/80 | HR 88 | Temp 98.6°F | Wt 189.3 lb

## 2016-10-27 DIAGNOSIS — M545 Low back pain, unspecified: Secondary | ICD-10-CM

## 2016-10-27 DIAGNOSIS — G43009 Migraine without aura, not intractable, without status migrainosus: Secondary | ICD-10-CM

## 2016-10-27 MED ORDER — PREDNISONE 20 MG PO TABS: ORAL_TABLET | ORAL | 0 refills | Status: DC

## 2016-10-27 NOTE — Progress Notes (Signed)
Pre visit review using our clinic review tool, if applicable. No additional management support is needed unless otherwise documented below in the visit note. 

## 2016-10-27 NOTE — Patient Instructions (Signed)
Let us know if fever not resolved in next 48 hours Follow up for any fever or new symptoms.

## 2016-10-27 NOTE — Telephone Encounter (Signed)
I received voicemail on my phone from patient stating that she has received 3 bills from our office.  She wanted to verify with our office that we had all of her updated insurance information before she makes a payment for these bills.  Carollee Herter, could you help with this?

## 2016-10-27 NOTE — Progress Notes (Signed)
Subjective:     Patient ID: Danielle [name], female   DOB: 04-29-1976, 41 y.o.   MRN: [id number]  HPI Patient is seen in his work and with several complaints. First, she states she's had heavy periods for several months. She has been evaluated by GYN and has fibroids. Her periods have been fairly regular and she just was finishing up most recent period couple days ago.   She complains of left-sided headache which is mostly left temporal region. She does have reported history of migraine headaches. Current headache was much worse couple days ago but slowly improving. She has throbbing quality with some light sensitivity and nausea without vomiting. Worsening with movement. Relieved with rest in dark quiet. This is typical of headache she's had previously. Usually takes Excedrin Migraine. She uses ibuprofen which did not help.  She also has complained of some somewhat poorly localized lower thoracic and on her lumbar back pain for the past couple of weeks. For the past 3 days had some pains radiating around toward the mid abdominal region. No localizing pain. She had some chills couple days ago but none now. No nausea or vomiting. No stool changes. No dysuria. No relation to eating. No cough. No dyspnea. No chest pains.  She has some vague pain right upper thigh couple days ago which are pretty much now resolved. No radiculitis symptoms. Denies any lower extremity numbness or weakness.  No rash  Past Medical History:  Diagnosis Date  . Asthma   . HPV in female   . Migraine   . Polycystic ovary syndrome   . Thyroid disease    Past Surgical History:  Procedure Laterality Date  . cyro      reports that she has been smoking.  She has never used smokeless tobacco. She reports that she drinks alcohol. She reports that she does not use drugs. family history includes Cancer in her father; Diabetes in her mother; Hyperlipidemia in her mother; Hypertension in her mother and sister. Allergies  Allergen  Reactions  . Allegra [Fexofenadine Hcl]     Swelling occurs  . Sulfa Antibiotics   . Aspirin Rash    Baby asprin; as a child, rash from dyes      Review of Systems  Constitutional: Positive for chills and fatigue.  HENT: Positive for congestion.   Respiratory: Negative for cough and shortness of breath.   Cardiovascular: Negative for chest pain and leg swelling.  Gastrointestinal: Positive for abdominal pain and nausea. Negative for blood in stool, constipation, diarrhea and vomiting.  Genitourinary: Negative for dysuria.  Skin: Negative for rash.  Neurological: Positive for headaches.       Objective:   Physical Exam  Constitutional: She is oriented to person, place, and time. She appears well-developed and well-nourished.  HENT:  Right Ear: External ear normal.  Left Ear: External ear normal.  Mouth/Throat: Oropharynx is clear and moist. No oropharyngeal exudate.  Eyes: Pupils are equal, round, and reactive to light.  Neck: Neck supple.  Cardiovascular: Normal rate and regular rhythm.   Pulmonary/Chest: Effort normal and breath sounds normal. No respiratory distress. She has no wheezes. She has no rales.  Abdominal: Soft. Bowel sounds are normal. She exhibits no distension and no mass. There is no tenderness. There is no rebound and no guarding.  Musculoskeletal: She exhibits no edema.  Patient has no difficulties with flexing back 90 and extending 30. Straight leg raise is negative bilaterally.  Lymphadenopathy:    She has no cervical adenopathy.  Neurological: She is alert and oriented to person, place, and time. No cranial nerve deficit.  Full strength lower extremities with symmetric reflexes  Skin: No rash noted.       Assessment:     #1 unilateral headache-she has multiple features of migraine headache and current headache is improving but not resolved  #2 nondescript lumbar back pain for the past couple weeks. Nonfocal exam    Plan:     -Follow-up  immediately for any documented fever or any recurrent abdominal pain -Follow-up for any persistent back pain or any new symptoms such as radiculitis pain, numbness, weakness, etc. -Prednisone 20 mg 2 tablets once daily for 3 days for residual ?migraine headache.  Kristian Covey MD [facility]

## 2016-11-11 ENCOUNTER — Ambulatory Visit (INDEPENDENT_AMBULATORY_CARE_PROVIDER_SITE_OTHER): Payer: 59 | Admitting: Internal Medicine

## 2016-11-11 VITALS — BP 118/72 | HR 95 | Ht 69.0 in | Wt 189.0 lb

## 2016-11-11 DIAGNOSIS — Z8639 Personal history of other endocrine, nutritional and metabolic disease: Secondary | ICD-10-CM | POA: Diagnosis not present

## 2016-11-11 DIAGNOSIS — E282 Polycystic ovarian syndrome: Secondary | ICD-10-CM

## 2016-11-11 DIAGNOSIS — E890 Postprocedural hypothyroidism: Secondary | ICD-10-CM | POA: Diagnosis not present

## 2016-11-11 LAB — TSH: TSH: 3.99 u[IU]/mL (ref 0.35–4.50)

## 2016-11-11 LAB — T4, FREE: Free T4: 0.87 ng/dL (ref 0.60–1.60)

## 2016-11-11 MED ORDER — DESOGESTREL-ETHINYL ESTRADIOL 0.15-0.02/0.01 MG (21/5) PO TABS: 1.0000 | ORAL_TABLET | Freq: Every day | ORAL | 1 refills | Status: DC

## 2016-11-11 NOTE — Patient Instructions (Signed)
Please stop at the lab.  Please continue Levothyroxine 125 mcg daily.  Take the thyroid hormone every day, with water, at least 30 minutes before breakfast, separated by at least 4 hours from: - acid reflux medications - calcium - iron - multivitamins  Please come back for a follow-up appointment in 6 months.  

## 2016-11-11 NOTE — Progress Notes (Signed)
+-+ Patient ID: Danielle [name], female   DOB: July 25, 1975, 41 y.o.   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 Last visit 6 months ago  Hypothyroidism: She has a history of medication noncompliance with subsequent fluctuating TFTs  Pt is on levothyroxine 125 mcg daily, taken: - forgets it 1-2x a week >> takes it later in the day, sometimes after meals - in am - fasting - at least 30 min from b'fast - no Ca, Fe, MVI, PPIs - not on Biotin  She was on prednisone for back pain at the beginning of the month.  I reviewed pt's thyroid tests: Lab Results  Component Value Date   TSH 4.67 (H) 07/13/2016   TSH 1.40 03/05/2016   TSH 8.27 (H) 11/10/2015   TSH 21.68 (H) 08/19/2015   TSH 7.83 (H) 11/12/2013   TSH 0.24 (L) 10/08/2013   TSH 0.85 07/03/2013   TSH 12.91 (H) 05/10/2013   TSH 1.87 02/13/2013   TSH 0.04 (L) 01/08/2013   FREET4 0.92 07/13/2016   FREET4 1.34 03/05/2016   FREET4 0.80 11/10/2015   FREET4 0.54 (L) 08/19/2015   FREET4 0.18 (L) 08/15/2012   FREET4 1.87 (H) 07/04/2012   FREET4 3.32 (H) 06/12/2012  05/06/2014: (Dr. Dareen PianoAnderson, ObGyn): TSH 12.58  Pt denies: - feeling nodules in neck - hoarseness - dysphagia - choking - SOB with lying down  She has + FH of thyroid disorders in: mother. No FH of thyroid cancer. No h/o radiation tx to head or neck.  No seaweed or kelp. No recent contrast studies. No herbal supplements. No Biotin use. + recent Prednisone use  - for back pain - 2 weeks.  She has PCOS: - off OCPs >> stopped 1 year ago after she got married, as she desired a pregnancy >> now changed her mind >> no pregnancy desired  - She continues to have regular menses but if heavy bleeding for 3-4 days. She was diagnosed with fibroids. She would like to restart OCPs.  ObGyn: Dr. Leone PayorMarc Anderson.  She stopped Celexa.  ROS: Constitutional: no  weight gain/no weight loss, no fatigue, + subjective hyperthermia, + subjective hypothermia Eyes: no blurry vision, no xerophthalmia ENT: no sore throat, no nodules palpated in throat, no dysphagia, no odynophagia, no hoarseness Cardiovascular: no CP/no SOB/no palpitations/no leg swelling Respiratory: no cough/no SOB/no wheezing Gastrointestinal: no N/no V/no D/no C/no acid reflux Musculoskeletal: + muscle aches/no joint aches Skin: no rashes, no hair loss Neurological: no tremors/no numbness/no tingling/no dizziness, + HA  I reviewed pt's medications, allergies, PMH, social hx, family hx, and changes were documented in the history of present illness. Otherwise, unchanged from my initial visit note.   Past Medical History:  Diagnosis Date  . Asthma   . HPV in female   . Migraine   . Polycystic ovary syndrome   . Thyroid disease    Past Surgical History:  Procedure Laterality Date  . cyro     History   Social History  . Marital Status: Married    Spouse Name: N/A    Number of Children: 0   Occupational History  .    Social History Main Topics  . Smoking status: Former Games developermoker  . Smokeless tobacco: Not on file  . Alcohol Use: Yes  . Drug Use: No  . Sexual Activity: Yes    Birth Control/ Protection: Pill  Current Outpatient Prescriptions on File Prior to Visit  Medication Sig Dispense Refill  . cetirizine HCl (ZYRTEC) 5 MG/5ML SYRP Take 5 mLs (5 mg total) by mouth daily. 300 mL 0  . citalopram (CELEXA) 20 MG tablet Take 1 tablet (20 mg total) by mouth daily. 90 tablet 1  . desogestrel-ethinyl estradiol (AZURETTE) 0.15-0.02/0.01 MG (21/5) tablet Take 1 tablet by mouth daily. 6 Package 1  . ipratropium (ATROVENT) 0.06 % nasal spray Place 2 sprays into both nostrils 4 (four) times daily. 15 mL 12  . levothyroxine (SYNTHROID, LEVOTHROID) 125 MCG tablet Take 1 tablet by mouth  daily 90 tablet 3  . predniSONE (DELTASONE) 20 MG tablet Take 2 tablets once daily for 3 days 6  tablet 0   No current facility-administered medications on file prior to visit.    Allergies  Allergen Reactions  . Allegra [Fexofenadine Hcl]     Swelling occurs  . Sulfa Antibiotics   . Aspirin Rash    Baby asprin; as a child, rash from dyes    Family History  Problem Relation Age of Onset  . Hyperlipidemia Mother   . Hypertension Mother   . Diabetes Mother   . Hypertension Sister   . Cancer Father        lung tumor/ lung removed   PE: BP 118/72 (BP Location: Left Arm, Patient Position: Sitting)   Pulse 95   Ht 5\' 9"  (1.753 m)   Wt 189 lb (85.7 kg)   LMP 10/22/2016 (Exact Date)   SpO2 98%   BMI 27.91 kg/m  Wt Readings from Last 3 Encounters:  10/27/16 189 lb 4.8 oz (85.9 kg)  05/13/16 183 lb 9.6 oz (83.3 kg)  03/19/16 183 lb 8 oz (83.2 kg)   Constitutional: overweight, in NAD Eyes: PERRLA, EOMI, no exophthalmos ENT: moist mucous membranes, no thyromegaly, no cervical lymphadenopathy Cardiovascular: RRR (no tachycardia), No MRG Respiratory: CTA B Gastrointestinal: abdomen soft, NT, ND, BS+ Musculoskeletal: no deformities, strength intact in all 4 Skin: moist, warm, no rashes Neurological: no tremor with outstretched hands, DTR normal in all 4  ASSESSMENT: 1. Postablative Hypothyroidism - after RAI tx for Graves ds.  2. PCOS  PLAN:  1. Patient with long-standing hypothyroidism, on levothyroxine therapy, but not completely compliant with LT4. - latest thyroid labs reviewed with pt >> high as she is missing doses - she continues on LT4 125  mcg daily - we discussed about taking the thyroid hormone every day, with water, >30 minutes before breakfast, separated by >4 hours from acid reflux medications, calcium, iron, multivitamins. I strongly advised her to put the LT4 on the nightstand (she does not have one) or near the bed so that she does not forget it in am - she tells me she and her husband decided against a pregnancy - will check thyroid tests today: TSH  and fT4 - If labs are abnormal, she will need to return for repeat TFTs in 1.5 months - OTW, in 6 months for a visit  2. PCOS - She is now off oral contraceptives and has heavy menstrual cycles.  - she would like to restart her OCPs >> sent a new Rx   Office Visit on 11/11/2016  Component Date Value Ref Range Status  . TSH 11/11/2016 3.99  0.35 - 4.50 uIU/mL Final  . Free T4 11/11/2016 0.87  0.60 - 1.60 ng/dL Final   Comment: Specimens from patients who are undergoing biotin therapy and /or ingesting biotin supplements may contain high levels  of biotin.  The higher biotin concentration in these specimens interferes with this Free T4 assay.  Specimens that contain high levels  of biotin may cause false high results for this Free T4 assay.  Please interpret results in light of the total clinical presentation of the patient.     Will refill the same LT4 dose.  Carlus Pavlov, MD PhD [facility] Endocrinology

## 2016-11-12 ENCOUNTER — Encounter: Payer: Self-pay | Admitting: Internal Medicine

## 2016-11-12 MED ORDER — LEVOTHYROXINE SODIUM 125 MCG PO TABS: ORAL_TABLET | ORAL | 3 refills | Status: DC

## 2016-12-07 ENCOUNTER — Encounter: Payer: Self-pay | Admitting: Adult Health

## 2016-12-07 ENCOUNTER — Ambulatory Visit (INDEPENDENT_AMBULATORY_CARE_PROVIDER_SITE_OTHER): Payer: 59 | Admitting: Adult Health

## 2016-12-07 VITALS — BP 120/80 | Ht 69.0 in | Wt 191.1 lb

## 2016-12-07 DIAGNOSIS — J301 Allergic rhinitis due to pollen: Secondary | ICD-10-CM

## 2016-12-07 DIAGNOSIS — G479 Sleep disorder, unspecified: Secondary | ICD-10-CM | POA: Diagnosis not present

## 2016-12-07 MED ORDER — IPRATROPIUM BROMIDE 0.06 % NA SOLN: 2.0000 | Freq: Four times a day (QID) | NASAL | 12 refills | Status: DC

## 2016-12-07 MED ORDER — ZOLPIDEM TARTRATE 5 MG PO TABS: 5.0000 mg | ORAL_TABLET | Freq: Every evening | ORAL | 0 refills | Status: DC | PRN

## 2016-12-07 NOTE — Progress Notes (Signed)
Subjective:    Patient ID: Danielle [name], female    DOB: 21-Jul-1975, 41 y.o.   MRN: [id number]  HPI  41 year old female who  has a past medical history of Asthma; HPV in female; Migraine; Polycystic ovary syndrome; and Thyroid disease. She presents to the clinic today for follow up regarding sleep disturbance. I last saw her in September of last year and advised her to try Melatonin. She reports that she has been doing this but still suffers from insomnia. She has trouble falling asleep as well as staying a sleep.   Additionally, her husband reports that she snores and on nights when she does sleep throughout the night she will often wake up in a panic.   She also needs her Atrovent nasal spray renewed   Review of Systems  Constitutional: Negative.   Respiratory: Negative.   Cardiovascular: Negative.   Gastrointestinal: Negative.   Genitourinary: Negative.   Musculoskeletal: Negative.   Neurological: Negative.   All other systems reviewed and are negative.  Past Medical History:  Diagnosis Date  . Asthma   . HPV in female   . Migraine   . Polycystic ovary syndrome   . Thyroid disease     Social History   Social History  . Marital status: Married    Spouse name: N/A  . Number of children: N/A  . Years of education: N/A   Occupational History  . Not on file.   Social History Main Topics  . Smoking status: Current Some Day Smoker    Last attempt to quit: 06/24/2014  . Smokeless tobacco: Never Used  . Alcohol use 0.0 oz/week     Comment: ocassional  . Drug use: No  . Sexual activity: Yes    Birth control/ protection: Pill   Other Topics Concern  . Not on file   Social History Narrative  . No narrative on file    Past Surgical History:  Procedure Laterality Date  . cyro      Family History  Problem Relation Age of Onset  . Hyperlipidemia Mother   . Hypertension Mother   . Diabetes Mother   . Hypertension Sister   . Cancer Father        lung tumor/  lung removed    Allergies  Allergen Reactions  . Allegra [Fexofenadine Hcl]     Swelling occurs  . Sulfa Antibiotics   . Aspirin Rash    Baby asprin; as a child, rash from dyes     Current Outpatient Prescriptions on File Prior to Visit  Medication Sig Dispense Refill  . cetirizine HCl (ZYRTEC) 5 MG/5ML SYRP Take 5 mLs (5 mg total) by mouth daily. 300 mL 0  . citalopram (CELEXA) 20 MG tablet Take 1 tablet (20 mg total) by mouth daily. 90 tablet 1  . desogestrel-ethinyl estradiol (AZURETTE) 0.15-0.02/0.01 MG (21/5) tablet Take 1 tablet by mouth daily. 6 Package 1  . levothyroxine (SYNTHROID, LEVOTHROID) 125 MCG tablet Take 1 tablet by mouth  daily 90 tablet 3   No current facility-administered medications on file prior to visit.     BP 120/80 (BP Location: Left Arm, Patient Position: Sitting, Cuff Size: Normal)   Ht 5\' 9"  (1.753 m)   Wt 191 lb 1.6 oz (86.7 kg)   BMI 28.22 kg/m       Objective:   Physical Exam  Constitutional: She is oriented to person, place, and time. She appears well-developed and well-nourished. No distress.  Cardiovascular: Normal rate, regular  rhythm, normal heart sounds and intact distal pulses.  Exam reveals no gallop and no friction rub.   No murmur heard. Pulmonary/Chest: Effort normal and breath sounds normal.  Neurological: She is alert and oriented to person, place, and time.  Skin: Skin is warm and dry. No rash noted. She is not diaphoretic. No erythema. No pallor.  Psychiatric: She has a normal mood and affect. Her behavior is normal. Judgment and thought content normal.  Nursing note and vitals reviewed.     Assessment & Plan:  1. Sleep disturbance - Spoke about sleep aids and the addictive nature of them. I am ok with prescribing her Remus Lofflerambien a short course of ambien. I am also going to refer her to pulmonary to evaluate sleep apnea and for possible sleep study.  - zolpidem (AMBIEN) 5 MG tablet; Take 1 tablet (5 mg total) by mouth at bedtime  as needed for sleep.  Dispense: 30 tablet; Refill: 0 - Ambulatory referral to Pulmonology  2. Seasonal allergic rhinitis due to pollen  - ipratropium (ATROVENT) 0.06 % nasal spray; Place 2 sprays into both nostrils 4 (four) times daily.  Dispense: 15 mL; Refill: 12  Shirline Freesory Alivea Gladson, NP

## 2017-02-07 ENCOUNTER — Telehealth: Payer: Self-pay | Admitting: Adult Health

## 2017-02-07 DIAGNOSIS — G479 Sleep disorder, unspecified: Secondary | ICD-10-CM

## 2017-02-07 NOTE — Telephone Encounter (Signed)
Pt request refill  zolpidem (AMBIEN) 5 MG tablet  [facility] [id number] - [place], [place] - [id number]

## 2017-02-08 ENCOUNTER — Other Ambulatory Visit: Payer: Self-pay | Admitting: Adult Health

## 2017-02-08 DIAGNOSIS — G479 Sleep disorder, unspecified: Secondary | ICD-10-CM

## 2017-02-08 MED ORDER — ZOLPIDEM TARTRATE 5 MG PO TABS: 5.0000 mg | ORAL_TABLET | Freq: Every evening | ORAL | 0 refills | Status: DC | PRN

## 2017-02-08 NOTE — Telephone Encounter (Signed)
Called to the pharmacy and left on machine. 

## 2017-02-08 NOTE — Telephone Encounter (Signed)
Ok to refill for 30 days  

## 2017-02-08 NOTE — Telephone Encounter (Signed)
Last filled on 12/07/16 #30 and last seen on same day No future appointment on schedule.  Please advise.  Thanks!!

## 2017-03-18 ENCOUNTER — Encounter: Payer: Self-pay | Admitting: Adult Health

## 2017-03-30 ENCOUNTER — Institutional Professional Consult (permissible substitution): Payer: 59 | Admitting: Pulmonary Disease

## 2017-04-25 ENCOUNTER — Encounter: Payer: Self-pay | Admitting: Pulmonary Disease

## 2017-04-25 ENCOUNTER — Ambulatory Visit (INDEPENDENT_AMBULATORY_CARE_PROVIDER_SITE_OTHER): Payer: 59 | Admitting: Pulmonary Disease

## 2017-04-25 DIAGNOSIS — F5104 Psychophysiologic insomnia: Secondary | ICD-10-CM

## 2017-04-25 DIAGNOSIS — R0683 Snoring: Secondary | ICD-10-CM

## 2017-04-25 DIAGNOSIS — G4700 Insomnia, unspecified: Secondary | ICD-10-CM | POA: Insufficient documentation

## 2017-04-25 MED ORDER — ZOLPIDEM TARTRATE 10 MG PO TABS: 10.0000 mg | ORAL_TABLET | Freq: Every evening | ORAL | 0 refills | Status: DC | PRN

## 2017-04-25 NOTE — Assessment & Plan Note (Signed)
Given fatigue, narrow pharyngeal exam & loud snoring, obstructive sleep apnea is possible & an overnight polysomnogram will be scheduled as a home study. The pathophysiology of obstructive sleep apnea , it's cardiovascular consequences & modes of treatment including CPAP were discused with the patient in detail & they evidenced understanding.  Pre tets prob is intermediate

## 2017-04-25 NOTE — Assessment & Plan Note (Signed)
Rules of sleep hygiene were discussed  - light exercise -avoid caffeinated beverages - no more than 20 mins staying awake in bed, if not asleep, get out of bed & reading or light music - No TV or computer games at bedtime.   Rx for Ambien 10 mg at bedtime # 30 - no refills until referral Referral to CBT therapist Alternative would be trazodone

## 2017-04-25 NOTE — Patient Instructions (Signed)
Rules of sleep hygiene were discussed  - light exercise -avoid caffeinated beverages - no more than 20 mins staying awake in bed, if not asleep, get out of bed & reading or light music - No TV or computer games at bedtime.   Home sleep study rx for Ambien 10 mg at bedtime # 30 Referral to CBT therapist

## 2017-04-25 NOTE — Progress Notes (Signed)
Subjective:    Patient ID: Danielle [name], female    DOB: 1976/05/13, 41 y.o.   MRN: [id number]  HPI  Chief Complaint  Patient presents with  . Sleep Consult    Per patient, she has a hard time falling asleep and staying asleep at night. Denies ever having a sleep study before. Has a history of snoring.     41 year old presents for evaluation of insomnia and sleep disordered breathing. She reports having a hard time falling asleep and then staying asleep through the night for at least the past 3 years.  She does not report any problems with insomnia going up more as a young adult.  In her mind, she correlates onset of her symptoms with diagnosis of hypothyroidism.  She initially tried over-the-counter melatonin without relief and finally was prescribed 5 mg of Ambien.  She is now up to taking 2 tablets of Ambien and with this her sleep latency is about 30-40 minutes.   Her husband has also noted snoring recently and she reports non-refreshing sleep which is raised the question of obstructive sleep apnea.  She does report occasional choking and gasping episodes that have woken herself up from sleep.  Epworth sleepiness score is 4 Bedtime is between 10 and 11 PM, sleep latency is about 40 minutes after taking 10 mg of Ambien, reports 2-3 nocturnal awakenings spontaneously including one around 4 AM where she then stays awake for almost an hour and is finally out of bed by 6:45 AM feeling groggy with occasional headaches and dryness of mouth She has gained 20 pounds over the last 3 years Her thyroid supplement has been adjusted now  There is no history suggestive of cataplexy, sleep paralysis or parasomnias   She smokes 3 cigarettes a day and drinks alcohol on weekends.  This does not seem to affect her sleep. 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  Other past medical history includes polycystic ovarian disease and allergic asthma     Past Medical History:  Diagnosis  Date  . Asthma   . HPV in female   . Migraine   . Polycystic ovary syndrome   . Thyroid disease      Past Surgical History:  Procedure Laterality Date  . cyro       Social History   Social History  . Marital status: Married    Spouse name: N/A  . Number of children: N/A  . Years of education: N/A   Occupational History  . Not on file.   Social History Main Topics  . Smoking status: Current Some Day Smoker    Last attempt to quit: 06/24/2014  . Smokeless tobacco: Never Used  . Alcohol use 0.0 oz/week     Comment: ocassional  . Drug use: No  . Sexual activity: Yes    Birth control/ protection: Pill   Other Topics Concern  . Not on file   Social History Narrative  . No narrative on file   Family History  Problem Relation Age of Onset  . Hyperlipidemia Mother   . Hypertension Mother   . Diabetes Mother   . Hypertension Sister   . Cancer Father        lung tumor/ lung removed      Review of Systems Positive for dental issues, headaches, nasal congestion, anxiety depression   Constitutional: negative for anorexia, fevers and sweats  Eyes: negative for irritation, redness and visual disturbance  Ears, nose, mouth, throat, and face: negative  for earaches, epistaxis, nasal congestion and sore throat  Respiratory: negative for cough, dyspnea on exertion, sputum and wheezing  Cardiovascular: negative for chest pain, dyspnea, lower extremity edema, orthopnea, palpitations and syncope  Gastrointestinal: negative for abdominal pain, constipation, diarrhea, melena, nausea and vomiting  Genitourinary:negative for dysuria, frequency and hematuria  Hematologic/lymphatic: negative for bleeding, easy bruising and lymphadenopathy  Musculoskeletal:negative for arthralgias, muscle weakness and stiff joints  Neurological: negative for coordination problems, gait problems, headaches and weakness  Endocrine: negative for diabetic symptoms including polydipsia, polyuria and  weight loss     Objective:   Physical Exam  Gen. Pleasant, well-nourished, in no distress, normal affect ENT - facial hair, no post nasal drip Neck: No JVD, no thyromegaly, no carotid bruits Lungs: no use of accessory muscles, no dullness to percussion, clear without rales or rhonchi  Cardiovascular: Rhythm regular, heart sounds  normal, no murmurs or gallops, no peripheral edema Abdomen: soft and non-tender, no hepatosplenomegaly, BS normal. Musculoskeletal: No deformities, no cyanosis or clubbing Neuro:  alert, non focal       Assessment & Plan:

## 2017-05-05 ENCOUNTER — Telehealth: Payer: Self-pay | Admitting: Pulmonary Disease

## 2017-05-05 NOTE — Telephone Encounter (Signed)
PCC's- did you guys call her about her HST? I see he ord this for her, thanks

## 2017-05-05 NOTE — Telephone Encounter (Signed)
I just tried calling again and had to lvm again which will be the 3rd time lvm. I always give my name and my direct number for them to call me back.

## 2017-05-09 ENCOUNTER — Telehealth: Payer: Self-pay | Admitting: Pulmonary Disease

## 2017-05-09 NOTE — Telephone Encounter (Signed)
I just tried to call Mrs. Rosa to schedule this HST but had to leave another message if she calls in again please transfer her to me so we can quit playing phone tag. I keep leaving my direct number in the message and she doesn't call my number

## 2017-05-09 NOTE — Telephone Encounter (Signed)
[facility] to [facility]. Per phone note from 11.8.18 Synetta Failnita has been trying to contact pt regarding HST.

## 2017-05-12 NOTE — Telephone Encounter (Signed)
I finally got to speak with Mrs. Rosa and she is scheduled to pick up HST machine on 05/26/2017 @ 9:00am

## 2017-05-13 ENCOUNTER — Encounter: Payer: Self-pay | Admitting: Internal Medicine

## 2017-05-13 ENCOUNTER — Ambulatory Visit: Payer: 59 | Admitting: Internal Medicine

## 2017-05-13 VITALS — BP 110/68 | HR 104 | Temp 98.6°F | Ht 69.0 in | Wt 189.0 lb

## 2017-05-13 DIAGNOSIS — E282 Polycystic ovarian syndrome: Secondary | ICD-10-CM | POA: Diagnosis not present

## 2017-05-13 DIAGNOSIS — Z8639 Personal history of other endocrine, nutritional and metabolic disease: Secondary | ICD-10-CM | POA: Diagnosis not present

## 2017-05-13 DIAGNOSIS — E890 Postprocedural hypothyroidism: Secondary | ICD-10-CM

## 2017-05-13 LAB — TSH: TSH: 2.7 u[IU]/mL (ref 0.35–4.50)

## 2017-05-13 LAB — T4, FREE: Free T4: 1.07 ng/dL (ref 0.60–1.60)

## 2017-05-13 NOTE — Progress Notes (Signed)
Patient ID: Danielle [name], female   DOB: July 24, 1975, 41 y.o.   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 Last visit 6 mo ago.  Hypothyroidism: She has a history of medication noncompliance with subsequent fluctuating TFTs  Pt is on levothyroxine 125 mcg daily, taken: - missed 2 doses 2 weeks ago - in am - fasting - at least 30 min from b'fast - no Ca, Fe, MVI, PPIs - not on Biotin  I reviewed pt's thyroid tests: Lab Results  Component Value Date   TSH 3.99 11/11/2016   TSH 4.67 (H) 07/13/2016   TSH 1.40 03/05/2016   TSH 8.27 (H) 11/10/2015   TSH 21.68 (H) 08/19/2015   TSH 7.83 (H) 11/12/2013   TSH 0.24 (L) 10/08/2013   TSH 0.85 07/03/2013   TSH 12.91 (H) 05/10/2013   TSH 1.87 02/13/2013   FREET4 0.87 11/11/2016   FREET4 0.92 07/13/2016   FREET4 1.34 03/05/2016   FREET4 0.80 11/10/2015   FREET4 0.54 (L) 08/19/2015   FREET4 0.18 (L) 08/15/2012   FREET4 1.87 (H) 07/04/2012   FREET4 3.32 (H) 06/12/2012  05/06/2014: (Dr. Dareen PianoAnderson, ObGyn): TSH 12.58  Pt denies: - feeling nodules in neck - hoarseness - dysphagia - choking - SOB with lying down  She has + FH of thyroid disorders in: mother. No FH of thyroid cancer. No h/o radiation tx to head or neck.  No seaweed or kelp. No recent contrast studies. No herbal supplements. No Biotin use. No recent steroids use.   PCOS: - we restarted OCPs at last visit as she developed heavy bleeding with her menses - She initially stopped them 1 year ago after she got married as she desired the pregnancy.  She changed her mind afterwards >> no pregnancy desired  ObGyn: Dr. Leone PayorMarc Anderson.  ROS: Constitutional: no weight gain/no weight loss, + fatigue, no subjective hyperthermia, no subjective hypothermia Eyes: + blurry vision, no xerophthalmia ENT: no sore throat, + see HPI Cardiovascular: no CP/no SOB/no  palpitations/no leg swelling Respiratory: no cough/no SOB/no wheezing Gastrointestinal: no N/no V/no D/no C/no acid reflux Musculoskeletal: no muscle aches/no joint aches Skin: no rashes, no hair loss Neurological: no tremors/no numbness/no tingling/no dizziness  I reviewed pt's medications, allergies, PMH, social hx, family hx, and changes were documented in the history of present illness. Otherwise, unchanged from my initial visit note.  Past Medical History:  Diagnosis Date  . Asthma   . HPV in female   . Migraine   . Polycystic ovary syndrome   . Thyroid disease    Past Surgical History:  Procedure Laterality Date  . cyro     History   Social History  . Marital Status: Married    Spouse Name: N/A    Number of Children: 0   Occupational History  .    Social History Main Topics  . Smoking status: Former Games developermoker  . Smokeless tobacco: Not on file  . Alcohol Use: Yes  . Drug Use: No  . Sexual Activity: Yes    Birth Control/ Protection: Pill   Current Outpatient Medications on File Prior to Visit  Medication Sig Dispense Refill  . citalopram (CELEXA) 20 MG tablet Take 1 tablet (20 mg total) by mouth daily. 90 tablet 1  . desogestrel-ethinyl estradiol (AZURETTE) 0.15-0.02/0.01 MG (21/5) tablet Take 1 tablet by mouth daily. 6 Package 1  . ipratropium (ATROVENT)  0.06 % nasal spray Place 2 sprays into both nostrils 4 (four) times daily. 15 mL 12  . levothyroxine (SYNTHROID, LEVOTHROID) 125 MCG tablet Take 1 tablet by mouth  daily 90 tablet 3  . zolpidem (AMBIEN) 10 MG tablet Take 1 tablet (10 mg total) by mouth at bedtime as needed for sleep. 30 tablet 0   No current facility-administered medications on file prior to visit.    Allergies  Allergen Reactions  . Allegra [Fexofenadine Hcl]     Swelling occurs  . Sulfa Antibiotics   . Aspirin Rash    Baby asprin; as a child, rash from dyes    Family History  Problem Relation Age of Onset  . Hyperlipidemia Mother   .  Hypertension Mother   . Diabetes Mother   . Hypertension Sister   . Cancer Father        lung tumor/ lung removed   PE: BP 110/68   Pulse (!) 104   Temp 98.6 F (37 C) (Oral)   Ht 5\' 9"  (1.753 m)   Wt 189 lb (85.7 kg)   SpO2 98%   BMI 27.91 kg/m  Wt Readings from Last 3 Encounters:  05/13/17 189 lb (85.7 kg)  04/25/17 181 lb (82.1 kg)  12/07/16 191 lb 1.6 oz (86.7 kg)   Constitutional: overweight, in NAD Eyes: PERRLA, EOMI, no exophthalmos ENT: moist mucous membranes, no thyromegaly, no cervical lymphadenopathy Cardiovascular: tachycardia, RR, No MRG Respiratory: CTA B Gastrointestinal: abdomen soft, NT, ND, BS+ Musculoskeletal: no deformities, strength intact in all 4 Skin: moist, warm, no rashes Neurological: no tremor with outstretched hands, DTR normal in all 4  ASSESSMENT: 1. Postablative Hypothyroidism - after RAI tx for Graves ds.  2. PCOS  PLAN:  1. Patient with long-standing hypothyroidism, on levothyroxine therapy, previously not completely compliant with LT4, now takes this consistently - latest thyroid labs reviewed with pt >> normal at last visit - she continues on LT4 125 mcg daily - pt feels good on this dose. She has some fatigue though, but most likely 2/2 insomnia (increased Ambien dose). She also has OSA. - She does not have signs of Graves' ophthalmopathy - we discussed about taking the thyroid hormone every day, with water, >30 minutes before breakfast, separated by >4 hours from acid reflux medications, calcium, iron, multivitamins. Pt. is taking it correctly - will check thyroid tests today: TSH and fT4 - she would also want me to check her blood type - If labs are abnormal, she will need to return for repeat TFTs in 1.5 months - OTW, RTC in 1 year  2. PCOS - At last visit, she was off her oral contraceptives and she had heavy menstrual cycles.  At that time, we decided to restart her OCPs - she only restarted them last month as her bleeding  got worse >> still not improved. Will see ObGyn soon - we discussed she may need a higher Estrogen dose  >> will d/w ObGyn  Component     Latest Ref Rng & Units 05/13/2017  T4,Free(Direct)     0.60 - 1.60 ng/dL 1.471.07  TSH     [phone] - 5.624.50 uIU/mL 2.70  TFTs normal.  Carlus Pavlovristina Dahir Ayer, MD PhD [facility]

## 2017-05-13 NOTE — Progress Notes (Signed)
Pre visit review using our clinic review tool, if applicable. No additional management support is needed unless otherwise documented below in the visit note. 

## 2017-05-13 NOTE — Patient Instructions (Signed)
Please stop at the lab.  Please continue Levothyroxine 125 mcg daily.  Take the thyroid hormone every day, with water, at least 30 minutes before breakfast, separated by at least 4 hours from: - acid reflux medications - calcium - iron - multivitamins  Please come back for a follow-up appointment in 1 year. 

## 2017-05-26 DIAGNOSIS — G4733 Obstructive sleep apnea (adult) (pediatric): Secondary | ICD-10-CM | POA: Diagnosis not present

## 2017-05-27 ENCOUNTER — Telehealth: Payer: Self-pay | Admitting: Pulmonary Disease

## 2017-05-27 DIAGNOSIS — G4733 Obstructive sleep apnea (adult) (pediatric): Secondary | ICD-10-CM | POA: Diagnosis not present

## 2017-05-27 NOTE — Telephone Encounter (Signed)
Per RA, HST showed mild-moderate OSA with 15 events per hour. Suggests a CPAP rx if still symptomatic.   Auto CPAP 5-10cm, nasal pillows, humidity, DL & F/U in 1 month.   Will place these orders once patient is aware.

## 2017-06-03 ENCOUNTER — Other Ambulatory Visit: Payer: Self-pay | Admitting: *Deleted

## 2017-06-03 DIAGNOSIS — R0683 Snoring: Secondary | ICD-10-CM

## 2017-06-15 ENCOUNTER — Telehealth: Payer: Self-pay | Admitting: Pulmonary Disease

## 2017-06-15 DIAGNOSIS — G4733 Obstructive sleep apnea (adult) (pediatric): Secondary | ICD-10-CM

## 2017-06-15 NOTE — Telephone Encounter (Signed)
Will hold message until 12/20 at pt request.

## 2017-06-15 NOTE — Telephone Encounter (Signed)
Patient returned call, CB [phone]. Will be training today and asks that we call back 12/20 am.

## 2017-06-15 NOTE — Telephone Encounter (Signed)
Patient was never contacted from 11/30 phone note regarding HST results.  Results copied below:  ----------- Per RA, HST showed mild-moderate OSA with 15 events per hour. Suggests a CPAP rx if still symptomatic.   Auto CPAP 5-10cm, nasal pillows, humidity, DL & F/U in 1 month.   Will place these orders once patient is aware. ----------   lmtcb X1 for pt.

## 2017-06-16 NOTE — Telephone Encounter (Signed)
lmtcb for pt.  

## 2017-06-17 NOTE — Telephone Encounter (Signed)
Left a detailed message explaining results. Advised to call back to let us know if she wanted to start CPAP.

## 2017-06-17 NOTE — Telephone Encounter (Signed)
Pt returning call about results of [facility]ST. [phone]. Per Pt it is ok to leave voicemail with details of results. Gets off work 5 and requests to be called after 5 if possible.

## 2017-06-20 NOTE — Telephone Encounter (Signed)
Called and spoke to pt who agrees to be started on CPAP. Order placed for pt to begin CPAP and f/u appt made. Nothing further needed.

## 2017-07-25 ENCOUNTER — Ambulatory Visit: Payer: 59 | Admitting: Pulmonary Disease

## 2017-08-08 ENCOUNTER — Ambulatory Visit: Payer: 59 | Admitting: Pulmonary Disease

## 2017-08-23 ENCOUNTER — Other Ambulatory Visit: Payer: Self-pay | Admitting: Obstetrics and Gynecology

## 2017-08-23 DIAGNOSIS — R928 Other abnormal and inconclusive findings on diagnostic imaging of breast: Secondary | ICD-10-CM

## 2017-08-31 ENCOUNTER — Other Ambulatory Visit: Payer: 59

## 2017-08-31 ENCOUNTER — Ambulatory Visit
Admission: RE | Admit: 2017-08-31 | Discharge: 2017-08-31 | Disposition: A | Discharge status: Home / Self Care | Payer: 59 | Source: Ambulatory Visit | Attending: Obstetrics and Gynecology | Admitting: Obstetrics and Gynecology

## 2017-08-31 ENCOUNTER — Other Ambulatory Visit: Payer: Self-pay | Admitting: Obstetrics and Gynecology

## 2017-08-31 ENCOUNTER — Other Ambulatory Visit: Payer: Self-pay | Admitting: Adult Health

## 2017-08-31 DIAGNOSIS — F329 Major depressive disorder, single episode, unspecified: Secondary | ICD-10-CM

## 2017-08-31 DIAGNOSIS — N6310 Unspecified lump in the right breast, unspecified quadrant: Secondary | ICD-10-CM

## 2017-08-31 DIAGNOSIS — R928 Other abnormal and inconclusive findings on diagnostic imaging of breast: Secondary | ICD-10-CM

## 2017-08-31 DIAGNOSIS — F32A Depression, unspecified: Secondary | ICD-10-CM

## 2017-08-31 NOTE — Telephone Encounter (Signed)
Denied.  Pt needs cpx and lab work for further refills.

## 2017-09-13 ENCOUNTER — Telehealth: Payer: Self-pay | Admitting: Adult Health

## 2017-09-13 NOTE — Telephone Encounter (Addendum)
Left detailed message for the pt to call the office back to schedule appt for cpe in order to receive refills on the requested medication.

## 2017-09-13 NOTE — Telephone Encounter (Signed)
Copied from CRM #[id number]. [id number]: Quick Communication - See Telephone Encounter >> Sep 13, 2017 11:48 AM Raquel SarnaHayes, Teresa G wrote: Pt's medication was denied (citalopram (CELEXA) 20 MG tablet). Please call pt back ASAP.  Pt needing to take them now.

## 2017-09-27 ENCOUNTER — Ambulatory Visit (INDEPENDENT_AMBULATORY_CARE_PROVIDER_SITE_OTHER): Payer: 59 | Admitting: Adult Health

## 2017-09-27 ENCOUNTER — Encounter: Payer: Self-pay | Admitting: Adult Health

## 2017-09-27 VITALS — BP 116/68 | Temp 99.1°F | Ht 68.5 in | Wt 186.0 lb

## 2017-09-27 DIAGNOSIS — G4700 Insomnia, unspecified: Secondary | ICD-10-CM | POA: Diagnosis not present

## 2017-09-27 DIAGNOSIS — F419 Anxiety disorder, unspecified: Secondary | ICD-10-CM

## 2017-09-27 DIAGNOSIS — Z114 Encounter for screening for human immunodeficiency virus [HIV]: Secondary | ICD-10-CM | POA: Diagnosis not present

## 2017-09-27 DIAGNOSIS — F329 Major depressive disorder, single episode, unspecified: Secondary | ICD-10-CM | POA: Diagnosis not present

## 2017-09-27 DIAGNOSIS — Z0000 Encounter for general adult medical examination without abnormal findings: Secondary | ICD-10-CM | POA: Diagnosis not present

## 2017-09-27 DIAGNOSIS — Z720 Tobacco use: Secondary | ICD-10-CM

## 2017-09-27 DIAGNOSIS — F32A Depression, unspecified: Secondary | ICD-10-CM

## 2017-09-27 MED ORDER — VARENICLINE TARTRATE 0.5 MG X 11 & 1 MG X 42 PO MISC: ORAL | 0 refills | Status: DC

## 2017-09-27 MED ORDER — ZOLPIDEM TARTRATE 10 MG PO TABS
10.0000 mg | ORAL_TABLET | Freq: Every evening | ORAL | 2 refills | Status: DC | PRN
Start: 2017-09-27 — End: 2017-11-29

## 2017-09-27 MED ORDER — CITALOPRAM HYDROBROMIDE 20 MG PO TABS: 20.0000 mg | ORAL_TABLET | Freq: Every day | ORAL | 1 refills | Status: DC

## 2017-09-27 NOTE — Progress Notes (Signed)
Subjective:    Patient ID: Danielle Cellariffany [name], female    DOB: 1975/12/29, 42 y.o.   MRN: [id number]  HPI  Patient presents for yearly preventative medicine examination. She is a pleasant 42 year old female who  has a past medical history of Asthma, HPV in female, Migraine, Polycystic ovary syndrome, and Thyroid disease.   She takes Synthroid 125 mcg for thyroid disease. She is managed by Dr. Elvera LennoxGherghe   She takes Celexa 20 mg for anxiety& Depression  -stable   She takes Ambien 10 mg for insomnia - stable   All immunizations and health maintenance protocols were reviewed with the patient and needed orders were placed.  Appropriate screening laboratory values were ordered for the patient including screening of hyperlipidemia, renal function and hepatic function.  Medication reconciliation,  past medical history, social history, problem list and allergies were reviewed in detail with the patient  Goals were established with regard to weight loss, exercise, and  diet in compliance with medications. She is eating healthier and has started exercising.  Wt Readings from Last 3 Encounters:  09/27/17 186 lb (84.4 kg)  05/13/17 189 lb (85.7 kg)  04/25/17 181 lb (82.1 kg)   She is up to date on her mammogram and pap.   She continues to smoke 3-4 per day. She is using the patches but finds that when she takes off the patch that it will cause her to have cravings. Smokes more on the weekend when drinking. She is interested in trying Chantix.    Review of Systems  Constitutional: Negative.   HENT: Negative.   Eyes: Negative.   Respiratory: Negative.   Cardiovascular: Negative.   Gastrointestinal: Negative.   Endocrine: Negative.   Genitourinary: Negative.   Musculoskeletal: Negative.   Skin: Negative.   Allergic/Immunologic: Negative.   Neurological: Negative.   Hematological: Negative.   Psychiatric/Behavioral: Negative.    Past Medical History:  Diagnosis Date  . Asthma   . HPV in  female   . Migraine   . Polycystic ovary syndrome   . Thyroid disease     Social History   Socioeconomic History  . Marital status: Married    Spouse name: Not on file  . Number of children: Not on file  . Years of education: Not on file  . Highest education level: Not on file  Occupational History  . Not on file  Social Needs  . Financial resource strain: Not on file  . Food insecurity:    Worry: Not on file    Inability: Not on file  . Transportation needs:    Medical: Not on file    Non-medical: Not on file  Tobacco Use  . Smoking status: Current Some Day Smoker    Last attempt to quit: 06/24/2014    Years since quitting: 3.2  . Smokeless tobacco: Never Used  Substance and Sexual Activity  . Alcohol use: Yes    Alcohol/week: 0.0 oz    Comment: ocassional  . Drug use: No  . Sexual activity: Yes    Birth control/protection: Pill  Lifestyle  . Physical activity:    Days per week: Not on file    Minutes per session: Not on file  . Stress: Not on file  Relationships  . Social connections:    Talks on phone: Not on file    Gets together: Not on file    Attends religious service: Not on file    Active member of club or organization: Not on file  Attends meetings of clubs or organizations: Not on file    Relationship status: Not on file  . Intimate partner violence:    Fear of current or ex partner: Not on file    Emotionally abused: Not on file    Physically abused: Not on file    Forced sexual activity: Not on file  Other Topics Concern  . Not on file  Social History Narrative  . Not on file    Past Surgical History:  Procedure Laterality Date  . cyro      Family History  Problem Relation Age of Onset  . Hyperlipidemia Mother   . Hypertension Mother   . Diabetes Mother   . Hypertension Sister   . Cancer Father        lung tumor/ lung removed  . Breast cancer Paternal Aunt   . Breast cancer Paternal Grandmother     Allergies  Allergen  Reactions  . Allegra [Fexofenadine Hcl]     Swelling occurs  . Sulfa Antibiotics   . Aspirin Rash    Baby asprin; as a child, rash from dyes     Current Outpatient Medications on File Prior to Visit  Medication Sig Dispense Refill  . etonogestrel-ethinyl estradiol (NUVARING) 0.12-0.015 MG/24HR vaginal ring NuvaRing 0.12 mg -0.015 mg/24 hr vaginal  Insert 1 vaginal ring every month by vaginal route.    Marland Kitchen levothyroxine (SYNTHROID, LEVOTHROID) 125 MCG tablet Take 1 tablet by mouth  daily 90 tablet 3  . citalopram (CELEXA) 20 MG tablet Take 1 tablet (20 mg total) by mouth daily. (Patient not taking: Reported on 09/27/2017) 90 tablet 1  . zolpidem (AMBIEN) 10 MG tablet Take 1 tablet (10 mg total) by mouth at bedtime as needed for sleep. 30 tablet 0   No current facility-administered medications on file prior to visit.     BP 116/68 (BP Location: Right Arm)   Temp 99.1 F (37.3 C) (Oral)   Ht 5' 8.5" (1.74 m) Comment: Without Shoes  Wt 186 lb (84.4 kg)   LMP 08/29/2017   BMI 27.87 kg/m       Objective:   Physical Exam  Constitutional: She is oriented to person, place, and time. She appears well-developed and well-nourished. No distress.  HENT:  Head: Normocephalic and atraumatic.  Right Ear: External ear normal.  Left Ear: External ear normal.  Nose: Nose normal.  Mouth/Throat: Oropharynx is clear and moist. No oropharyngeal exudate.  Eyes: Pupils are equal, round, and reactive to light. Conjunctivae and EOM are normal. Right eye exhibits no discharge. Left eye exhibits no discharge. No scleral icterus.  Neck: Normal range of motion. Neck supple. No JVD present. No tracheal deviation present. No thyromegaly present.  Cardiovascular: Normal rate, regular rhythm, normal heart sounds and intact distal pulses. Exam reveals no gallop and no friction rub.  No murmur heard. Pulmonary/Chest: Effort normal and breath sounds normal. No stridor. No respiratory distress. She has no wheezes.  She has no rales. She exhibits no tenderness.  Abdominal: Soft. Bowel sounds are normal. She exhibits no distension and no mass. There is no tenderness. There is no rebound and no guarding.  Musculoskeletal: Normal range of motion. She exhibits no edema, tenderness or deformity.  Lymphadenopathy:    She has no cervical adenopathy.  Neurological: She is alert and oriented to person, place, and time. No cranial nerve deficit. Coordination normal.  Skin: Skin is warm and dry. No rash noted. She is not diaphoretic. No erythema. No pallor.  Psychiatric:  She has a normal mood and affect. Her behavior is normal. Judgment and thought content normal.  Nursing note and vitals reviewed.     Assessment & Plan:  1. Routine general medical examination at a health care facility - Continue to exercise and work on diet  - Follow up in one year for next CPE or sooner if needed - Basic metabolic panel; Future - CBC with Differential/Platelet; Future - Hepatic function panel; Future - Lipid panel; Future  2. Depression, unspecified depression type  - citalopram (CELEXA) 20 MG tablet; Take 1 tablet (20 mg total) by mouth daily.  Dispense: 90 tablet; Refill: 1  3. Tobacco use  - varenicline (CHANTIX STARTING MONTH PAK) 0.5 MG X 11 & 1 MG X 42 tablet; Take one 0.5 mg tablet by mouth once daily for 3 days, then increase to one 0.5 mg tablet twice daily for 4 days, then increase to one 1 mg tablet twice daily.  Dispense: 53 tablet; Refill: 0 Trial of chantix. Common side effects including rare risk of suicide ideation was discussed with the patient today.  Patient is instructed to go directly to the ED if this occurs.  We discussed that patient can continue to smoke for 1 week after starting chantix, but then must discontinue cigarettes.  He is also instructed to contact us prior to completion of the starter month pack for an rx for the continuation month pack.  5 minutes spent with patient today on tobacco  cessation counseling.  - Coupon given for prescription  4. Insomnia, unspecified type  - zolpidem (AMBIEN) 10 MG tablet; Take 1 tablet (10 mg total) by mouth at bedtime as needed for sleep.  Dispense: 30 tablet; Refill: 2  5. Anxiety  - citalopram (CELEXA) 20 MG tablet; Take 1 tablet (20 mg total) by mouth daily.  Dispense: 90 tablet; Refill: 1  6. Encounter for screening for HIV  - HIV antibody; Future   Shirline Frees, NP

## 2017-09-27 NOTE — Patient Instructions (Signed)
It was great seeing you today   Please follow up for blood work - fasting   Please see me in one month after starting chantix

## 2017-11-25 ENCOUNTER — Telehealth: Payer: Self-pay | Admitting: Adult Health

## 2017-11-25 DIAGNOSIS — G4700 Insomnia, unspecified: Secondary | ICD-10-CM

## 2017-11-25 NOTE — Telephone Encounter (Signed)
Copied from CRM [id number]. Topic: Quick Communication - See Telephone Encounter >> Nov 25, 2017  2:37 PM Windy KalataMichael, Alexzandra Bilton L, NT wrote: CRM for notification. See Telephone encounter for: 11/25/17.  Patient is calling and states she is currently on zolpidem (AMBIEN) 10 MG tablet. She states her and Kandee KeenCory had talked about switching to trazodone and she had said no at first. She states she would like to try that or something different than Ambien because that is no longer working. Please advise.   [facility] [address] - [place], [place] - [address] [place] [id number] Phone: [phone] Fax: [phone]

## 2017-11-29 MED ORDER — TRAZODONE HCL 100 MG PO TABS: 100.0000 mg | ORAL_TABLET | Freq: Every evening | ORAL | 0 refills | Status: DC | PRN

## 2017-11-29 NOTE — Telephone Encounter (Signed)
Spoke to patient about switching from Ambien to Trazodone. I will have her stop Celexa and Ambien and trial her on Trazodone. She has not had Celexa in greater than one week and is not experiencing any withdrawal symptoms.  She will follow up in one month

## 2017-12-05 ENCOUNTER — Inpatient Hospital Stay (HOSPITAL_COMMUNITY)
Admission: AD | Admit: 2017-12-05 | Discharge: 2017-12-05 | Discharge status: Against Medical Advice | Payer: 59 | Source: Ambulatory Visit | Attending: Obstetrics | Admitting: Obstetrics

## 2017-12-05 NOTE — MAU Note (Signed)
Not in lobby

## 2018-03-10 ENCOUNTER — Ambulatory Visit: Admission: RE | Admit: 2018-03-10 | Payer: 59 | Source: Ambulatory Visit

## 2018-03-10 ENCOUNTER — Other Ambulatory Visit: Payer: Self-pay | Admitting: Obstetrics and Gynecology

## 2018-03-10 ENCOUNTER — Ambulatory Visit
Admission: RE | Admit: 2018-03-10 | Discharge: 2018-03-10 | Disposition: A | Discharge status: Home / Self Care | Payer: 59 | Source: Ambulatory Visit | Attending: Obstetrics and Gynecology | Admitting: Obstetrics and Gynecology

## 2018-03-10 DIAGNOSIS — N6489 Other specified disorders of breast: Secondary | ICD-10-CM

## 2018-03-10 DIAGNOSIS — N6310 Unspecified lump in the right breast, unspecified quadrant: Secondary | ICD-10-CM

## 2018-05-16 ENCOUNTER — Ambulatory Visit (INDEPENDENT_AMBULATORY_CARE_PROVIDER_SITE_OTHER): Payer: 59 | Admitting: Internal Medicine

## 2018-05-16 ENCOUNTER — Encounter: Payer: Self-pay | Admitting: Internal Medicine

## 2018-05-16 VITALS — BP 118/70 | HR 84 | Ht 68.5 in | Wt 188.0 lb

## 2018-05-16 DIAGNOSIS — E282 Polycystic ovarian syndrome: Secondary | ICD-10-CM

## 2018-05-16 DIAGNOSIS — Z8639 Personal history of other endocrine, nutritional and metabolic disease: Secondary | ICD-10-CM

## 2018-05-16 DIAGNOSIS — E890 Postprocedural hypothyroidism: Secondary | ICD-10-CM | POA: Diagnosis not present

## 2018-05-16 NOTE — Patient Instructions (Signed)
Please stop at the lab.  Please continue Levothyroxine 125 mcg daily.  Take the thyroid hormone every day, with water, at least 30 minutes before breakfast, separated by at least 4 hours from: - acid reflux medications - calcium - iron - multivitamins  Please come back for a follow-up appointment in 1 year. 

## 2018-05-16 NOTE — Progress Notes (Signed)
Patient ID: Danielle [name], female   DOB: Dec 11, 1975, 42 y.o.   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 Last visit 1 year ago.  Since last visit, she was very busy working on her masters degree.  She will finish this in 2 years.    She also had a lot of stress as she separated from her husband this summer.  Hypothyroidism: She has a history of medication noncompliance with subsequently fluctuating TFTs.  Pt is on levothyroxine 125 mcg daily, taken: - missed 3 days in last month - in am - fasting - at least 30 min from b'fast - no Ca, Fe, PPIs - + MVIs 4h later >> stopped 2/2 constipation - not on Biotin  Review TFTs: Lab Results  Component Value Date   TSH 2.70 05/13/2017   TSH 3.99 11/11/2016   TSH 4.67 (H) 07/13/2016   TSH 1.40 03/05/2016   TSH 8.27 (H) 11/10/2015   TSH 21.68 (H) 08/19/2015   TSH 7.83 (H) 11/12/2013   TSH 0.24 (L) 10/08/2013   TSH 0.85 07/03/2013   TSH 12.91 (H) 05/10/2013   FREET4 1.07 05/13/2017   FREET4 0.87 11/11/2016   FREET4 0.92 07/13/2016   FREET4 1.34 03/05/2016   FREET4 0.80 11/10/2015   FREET4 0.54 (L) 08/19/2015   FREET4 0.18 (L) 08/15/2012   FREET4 1.87 (H) 07/04/2012   FREET4 3.32 (H) 06/12/2012  05/06/2014: (Dr. Dareen PianoAnderson, ObGyn): TSH 12.58  No results found for: TSI   Pt denies: - feeling nodules in neck - hoarseness - dysphagia - choking - SOB with lying down  She has + FH of thyroid disorders in: mother. No FH of thyroid cancer. No h/o radiation tx to head or neck.  No herbal supplements. No Biotin use. No recent steroids use.   PCOS: -She developed heavy bleeding, after which we started OCPs in 2017.  However, she continued to bleed and I advised her to discuss with OB/GYN about this. -No desire for pregnancy.  She was off OCPs  - for 3 mo this summer. She then started NuvaRing >> now off  for the last 2 months.  She still has a lot of bleeding.  Latest HbA1c: Lab Results  Component Value Date   HGBA1C 5.9 08/15/2012   ObGyn: Dr. Leone PayorMarc Anderson.  ROS: Constitutional: no weight gain/no weight loss, no fatigue, no subjective hyperthermia, no subjective hypothermia Eyes: + blurry vision, no xerophthalmia ENT: no sore throat, + see HPI Cardiovascular: no CP/no SOB/no palpitations/no leg swelling Respiratory: no cough/no SOB/no wheezing Gastrointestinal: no N/no V/no D/no C/no acid reflux Musculoskeletal: no muscle aches/no joint aches Skin: no rashes, no hair loss Neurological: no tremors/no numbness/no tingling/no dizziness  I reviewed pt's medications, allergies, PMH, social hx, family hx, and changes were documented in the history of present illness. Otherwise, unchanged from my initial visit note.  Past Medical History:  Diagnosis Date  . Asthma   . HPV in female   . Migraine   . Polycystic ovary syndrome   . Thyroid disease    Past Surgical History:  Procedure Laterality Date  . cyro     History   Social History  . Marital Status: Married    Spouse Name: N/A    Number of Children: 0   Occupational History  .    Social History Main Topics  . Smoking status: Former Games developermoker  .  Smokeless tobacco: Not on file  . Alcohol Use: Yes  . Drug Use: No  . Sexual Activity: Yes    Birth Control/ Protection: Pill   Current Outpatient Medications on File Prior to Visit  Medication Sig Dispense Refill  . etonogestrel-ethinyl estradiol (NUVARING) 0.12-0.015 MG/24HR vaginal ring NuvaRing 0.12 mg -0.015 mg/24 hr vaginal  Insert 1 vaginal ring every month by vaginal route.    Marland Kitchen levothyroxine (SYNTHROID, LEVOTHROID) 125 MCG tablet Take 1 tablet by mouth  daily 90 tablet 3  . traZODone (DESYREL) 100 MG tablet Take 1 tablet (100 mg total) by mouth at bedtime as needed for sleep. 90 tablet 0  . varenicline (CHANTIX STARTING MONTH PAK) 0.5 MG X 11 & 1 MG X 42 tablet Take  one 0.5 mg tablet by mouth once daily for 3 days, then increase to one 0.5 mg tablet twice daily for 4 days, then increase to one 1 mg tablet twice daily. 53 tablet 0   No current facility-administered medications on file prior to visit.    Allergies  Allergen Reactions  . Allegra [Fexofenadine Hcl]     Swelling occurs  . Sulfa Antibiotics   . Aspirin Rash    Baby asprin; as a child, rash from dyes    Family History  Problem Relation Age of Onset  . Hyperlipidemia Mother   . Hypertension Mother   . Diabetes Mother   . Hypertension Sister   . Cancer Father        lung tumor/ lung removed  . Breast cancer Paternal Aunt   . Breast cancer Paternal Grandmother    PE: BP 118/70   Pulse 84   Ht 5' 8.5" (1.74 m) Comment: measured  Wt 188 lb (85.3 kg)   SpO2 98%   BMI 28.17 kg/m  Wt Readings from Last 3 Encounters:  05/16/18 188 lb (85.3 kg)  09/27/17 186 lb (84.4 kg)  05/13/17 189 lb (85.7 kg)   Constitutional: Slightly overweight, in NAD Eyes: PERRLA, EOMI, no exophthalmos ENT: moist mucous membranes, no thyromegaly, no cervical lymphadenopathy Cardiovascular: RRR, No MRG Respiratory: CTA B Gastrointestinal: abdomen soft, NT, ND, BS+ Musculoskeletal: no deformities, strength intact in all 4 Skin: moist, warm, no rashes Neurological: no tremor with outstretched hands, DTR normal in all 4  ASSESSMENT: 1. Postablative Hypothyroidism - after RAI tx for Graves ds.  2.  History of Graves' disease  3. PCOS  PLAN:  1. Patient with long-standing hypothyroidism previously noncompliant with levothyroxine, but in the last few years she takes this consistently.  At last visit she was still feeling tired, but this could have been due to insomnia and OSA.  - latest thyroid labs reviewed with pt >> normal 04/2017 - she continues on LT4 125 mcg daily - pt feels good on this dose with still occasional fatigue.. - we discussed about taking the thyroid hormone every day, with water,  >30 minutes before breakfast, separated by >4 hours from acid reflux medications, calcium, iron, multivitamins. Pt. is taking it correctly, but still occasionally skips doses.  We discussed about how to take it correctly including putting it on the nightstand but she does not have one. - will check thyroid tests today: TSH and fT4 - If labs are abnormal, she will need to return for repeat TFTs in 1.5 months  2.  History of Graves' disease -Appears resolved after her RAI treatment -No neck compression symptoms -No sign of Graves' ophthalmopathy, but she does complain of more blurry vision -Last visit  with her ophthalmologist was a year ago.  I advised her to schedule another one. -We will check her TSI's today and I advised her to record the titer and take it to her ophthalmologist  3. PCOS - She had heavy menstrual cycles off oral contraceptives in the past.  2 years ago we decided to restart her OCPs.  At that time, we decided to restart her OCPs.  She did so but bleeding got worse so we discussed to run this by OB/GYN to see if she needed a higher estrogen dose.  She did see them and was started on NuvaRing recently.  However, she is off this for the last 2 months.  Bleeding did not change much after she started contraception. - She did not have an HbA1c in the last 5 years, will repeat one today  Component     Latest Ref Rng & Units 05/16/2018  TSH     0.35 - 4.50 uIU/mL 15.43 (H)  T4,Free(Direct)     0.60 - 1.60 ng/dL 7.84  Hemoglobin O9G     4.6 - 6.5 % 6.2  TSI     <140 % baseline <89   TSIs undetectable. However, TSH high, I suspect 2/2 incomplete compliance with LT4. I stressed at the time of the visit that she cannot miss doses. Will not change her LT4 dose but have her back for a repeat TFT set in 2 months.  HbA1c higher,  in the prediabetic range.  Carlus Pavlov, MD PhD [facility] Endocrinology

## 2018-05-17 LAB — T4, FREE: FREE T4: 0.68 ng/dL (ref 0.60–1.60)

## 2018-05-17 LAB — HEMOGLOBIN A1C: HEMOGLOBIN A1C: 6.2 % (ref 4.6–6.5)

## 2018-05-17 LAB — TSH: TSH: 15.43 u[IU]/mL — ABNORMAL HIGH (ref 0.35–4.50)

## 2018-05-18 LAB — THYROID STIMULATING IMMUNOGLOBULIN: TSI: <89 % baseline (ref <140)

## 2018-07-04 ENCOUNTER — Telehealth: Payer: Self-pay | Admitting: *Deleted

## 2018-07-04 ENCOUNTER — Encounter: Payer: Self-pay | Admitting: Internal Medicine

## 2018-07-04 ENCOUNTER — Ambulatory Visit (INDEPENDENT_AMBULATORY_CARE_PROVIDER_SITE_OTHER): Payer: 59 | Admitting: Internal Medicine

## 2018-07-04 DIAGNOSIS — B309 Viral conjunctivitis, unspecified: Secondary | ICD-10-CM

## 2018-07-04 DIAGNOSIS — J069 Acute upper respiratory infection, unspecified: Secondary | ICD-10-CM

## 2018-07-04 DIAGNOSIS — G4700 Insomnia, unspecified: Secondary | ICD-10-CM

## 2018-07-04 MED ORDER — TRAZODONE HCL 100 MG PO TABS: 100.0000 mg | ORAL_TABLET | Freq: Every evening | ORAL | 1 refills | Status: DC | PRN

## 2018-07-04 NOTE — Progress Notes (Signed)
Acute Office Visit     CC/Reason for Visit: Bilateral eye itching, drainage, nasal congestion, sore throat  HPI: Danielle [name] is a 43 y.o. female who is coming in today for the above mentioned reasons.  She has been having symptoms of an upper respiratory infection for which she states has been present for at least 4 to 6 weeks.  She has been having runny nose, sore throat, over the past 2 weeks she noticed irritation and redness with clear drainage in her left eye that has now progressed to her right eye.  She comes in today for evaluation.  She has not had fever or chills, no facial pain, no eye swelling.  She has not tried any over-the-counter or prescription medications for this.   Past Medical/Surgical History: Past Medical History:  Diagnosis Date  . Asthma   . HPV in female   . Migraine   . Polycystic ovary syndrome   . Thyroid disease     Past Surgical History:  Procedure Laterality Date  . cyro      Social History:  reports that she has been smoking. She has never used smokeless tobacco. She reports current alcohol use. She reports that she does not use drugs.  Allergies: Allergies  Allergen Reactions  . Allegra [Fexofenadine Hcl]     Swelling occurs  . Sulfa Antibiotics   . Aspirin Rash    Baby asprin; as a child, rash from dyes     Family History:  Family History  Problem Relation Age of Onset  . Hyperlipidemia Mother   . Hypertension Mother   . Diabetes Mother   . Hypertension Sister   . Cancer Father        lung tumor/ lung removed  . Breast cancer Paternal Aunt   . Breast cancer Paternal Grandmother      Current Outpatient Medications:  .  etonogestrel-ethinyl estradiol (NUVARING) 0.12-0.015 MG/24HR vaginal ring, NuvaRing 0.12 mg -0.015 mg/24 hr vaginal  Insert 1 vaginal ring every month by vaginal route., Disp: , Rfl:  .  levothyroxine (SYNTHROID, LEVOTHROID) 125 MCG tablet, Take 1 tablet by mouth  daily, Disp: 90 tablet, Rfl: 3 .   traZODone (DESYREL) 100 MG tablet, Take 1 tablet (100 mg total) by mouth at bedtime as needed for sleep., Disp: 90 tablet, Rfl: 0 .  traZODone (DESYREL) 100 MG tablet, trazodone 100 mg tablet, Disp: , Rfl:  .  varenicline (CHANTIX STARTING MONTH PAK) 0.5 MG X 11 & 1 MG X 42 tablet, Take one 0.5 mg tablet by mouth once daily for 3 days, then increase to one 0.5 mg tablet twice daily for 4 days, then increase to one 1 mg tablet twice daily., Disp: 53 tablet, Rfl: 0  Review of Systems:  Constitutional: Denies fever, chills, diaphoresis, appetite change and fatigue.  HEENT: Denies photophobia, positive for mild eye pain and redness, nasal congestion, sore throat, rhinorrhea, denies hearing loss, ear pain,  sneezing, mouth sores, trouble swallowing, neck pain, neck stiffness and tinnitus.   Respiratory: Denies SOB, DOE, cough, chest tightness,  and wheezing.   Cardiovascular: Denies chest pain, palpitations and leg swelling.    Physical Exam: Vitals:   07/04/18 1121  BP: 110/70  Pulse: (!) 104  Temp: 98.5 F (36.9 C)  TempSrc: Oral  SpO2: 98%  Weight: 190 lb 9.6 oz (86.5 kg)    Body mass index is 28.56 kg/m.   Constitutional: NAD, calm, comfortable Eyes: PERRL, conjunctival injection, clear eye discharge bilaterally ENMT:  Mucous membranes are moist.  Pharynx is edematous without exudates.   Neck: normal, supple, no masses, no thyromegaly Respiratory: clear to auscultation bilaterally, no wheezing, no crackles. Normal respiratory effort. No accessory muscle use.  Cardiovascular: Regular rate and rhythm, no murmurs / rubs / gallops. No extremity edema. 2+ pedal pulses. No carotid bruits.    Impression and Plan:  Viral conjunctivitis of both eyes Viral upper respiratory tract infection -Presentation is most consistent with a viral/allergic conjunctivitis in the presence of an upper respiratory infection. -Have discussed appropriate handwashing, eye cleaning, avoidance of eye make-up.   May use lubricating eyedrops as needed. -She has been advised to schedule follow-up visit with [place] or with her ophthalmologist if not improved within 14 days or if she develops any warning signs such as fever, chills, facial pain, purulent discharge, visual blurring.    Patient Instructions  -It was nice meeting you today!  -Clean hands frequently and before touching eyes, sperate tissues for each eye. Lubricating eye drops as needed but at least twice a day.  -If fevers, chills, purulent drainage or worsening/blurry vision, set up appointment with [place] or eye doctor immediately.   Viral Conjunctivitis, Adult  Viral conjunctivitis is an inflammation of the clear membrane that covers the white part of your eye and the inner surface of your eyelid (conjunctiva). The inflammation is caused by a viral infection. The blood vessels in the conjunctiva become inflamed, causing the eye to become red or pink, and often itchy. Viral conjunctivitis can be easily passed from one person to another (is contagious). This condition is often called pink eye. What are the causes? This condition is caused by a virus. A virus is a type of contagious germ. It can be spread by touching objects that have been contaminated with the virus, such as doorknobs or towels. It can also be passed through droplets, such as from coughing or sneezing. What are the signs or symptoms? Symptoms of this condition include:  Eye redness.  Tearing or watery eyes.  Itchy and irritated eyes.  Burning feeling in the eyes.  Clear drainage from the eye.  Swollen eyelids.  A gritty feeling in the eye.  Light sensitivity. This condition often occurs with other symptoms, such as a fever, nausea, or a rash. How is this diagnosed? This condition is diagnosed with a medical history and physical exam. If you have discharge from your eye, the discharge may be tested to rule out other causes of conjunctivitis. How is this  treated? Viral conjunctivitis does not respond to medicines that kill bacteria (antibiotics). Treatment for viral conjunctivitis is directed at stopping a bacterial infection from developing in addition to the viral infection. Treatment also aims to relieve your symptoms, such as itching. This may be done with antihistamine drops or other eye medicines. Rarely, steroid eye drops or antiviral medicines may be prescribed. Follow these instructions at home: Medicines   Take or apply over-the-counter and prescription medicines only as told by your health care provider.  Be very careful to avoid touching the edge of the eyelid with the eye drop bottle or ointment tube when applying medicines to the affected eye. Being careful this way will stop you from spreading the infection to the other eye or to other people. Eye care  Avoid touching or rubbing your eyes.  Apply a warm, wet, clean washcloth to your eye for 10-20 minutes, 3-4 times per day or as told by your health care provider.  If you wear contact lenses,  do not wear them until the inflammation is gone and your health care provider says it is safe to wear them again. Ask your health care provider how to sterilize or replace your contact lenses before using them again. Wear glasses until you can resume wearing contacts.  Avoid wearing eye makeup until the inflammation is gone. Throw away any old eye cosmetics that may be contaminated.  Gently wipe away any drainage from your eye with a warm, wet washcloth or a cotton ball. General instructions  Change or wash your pillowcase every day or as told by your health care provider.  Do not share towels, pillowcases, washcloths, eye makeup, makeup brushes, contact lenses, or glasses. This may spread the infection.  Wash your hands often with soap and water. Use paper towels to dry your hands. If soap and water are not available, use hand sanitizer.  Try to avoid contact with other people for one  week or as told by your health care provider. Contact a health care provider if:  Your symptoms do not improve with treatment or they get worse.  You have increased pain.  Your vision becomes blurry.  You have a fever.  You have facial pain, redness, or swelling.  You have yellow or green drainage coming from your eye.  You have new symptoms. This information is not intended to replace advice given to you by your health care provider. Make sure you discuss any questions you have with your health care provider. Document Released: 09/04/2002 Document Revised: 01/10/2016 Document Reviewed: 12/30/2015 Elsevier Interactive Patient Education  2019 [employer]      Chaya JanEstela Hernandez Acosta, MD [facility]

## 2018-07-04 NOTE — Patient Instructions (Signed)
-It was nice meeting you today!  -Clean hands frequently and before touching eyes, sperate tissues for each eye. Lubricating eye drops as needed but at least twice a day.  -If fevers, chills, purulent drainage or worsening/blurry vision, set up appointment with us or eye doctor immediately.   Viral Conjunctivitis, Adult  Viral conjunctivitis is an inflammation of the clear membrane that covers the white part of your eye and the inner surface of your eyelid (conjunctiva). The inflammation is caused by a viral infection. The blood vessels in the conjunctiva become inflamed, causing the eye to become red or pink, and often itchy. Viral conjunctivitis can be easily passed from one person to another (is contagious). This condition is often called pink eye. What are the causes? This condition is caused by a virus. A virus is a type of contagious germ. It can be spread by touching objects that have been contaminated with the virus, such as doorknobs or towels. It can also be passed through droplets, such as from coughing or sneezing. What are the signs or symptoms? Symptoms of this condition include:  Eye redness.  Tearing or watery eyes.  Itchy and irritated eyes.  Burning feeling in the eyes.  Clear drainage from the eye.  Swollen eyelids.  A gritty feeling in the eye.  Light sensitivity. This condition often occurs with other symptoms, such as a fever, nausea, or a rash. How is this diagnosed? This condition is diagnosed with a medical history and physical exam. If you have discharge from your eye, the discharge may be tested to rule out other causes of conjunctivitis. How is this treated? Viral conjunctivitis does not respond to medicines that kill bacteria (antibiotics). Treatment for viral conjunctivitis is directed at stopping a bacterial infection from developing in addition to the viral infection. Treatment also aims to relieve your symptoms, such as itching. This may be done with  antihistamine drops or other eye medicines. Rarely, steroid eye drops or antiviral medicines may be prescribed. Follow these instructions at home: Medicines   Take or apply over-the-counter and prescription medicines only as told by your health care provider.  Be very careful to avoid touching the edge of the eyelid with the eye drop bottle or ointment tube when applying medicines to the affected eye. Being careful this way will stop you from spreading the infection to the other eye or to other people. Eye care  Avoid touching or rubbing your eyes.  Apply a warm, wet, clean washcloth to your eye for 10-20 minutes, 3-4 times per day or as told by your health care provider.  If you wear contact lenses, do not wear them until the inflammation is gone and your health care provider says it is safe to wear them again. Ask your health care provider how to sterilize or replace your contact lenses before using them again. Wear glasses until you can resume wearing contacts.  Avoid wearing eye makeup until the inflammation is gone. Throw away any old eye cosmetics that may be contaminated.  Gently wipe away any drainage from your eye with a warm, wet washcloth or a cotton ball. General instructions  Change or wash your pillowcase every day or as told by your health care provider.  Do not share towels, pillowcases, washcloths, eye makeup, makeup brushes, contact lenses, or glasses. This may spread the infection.  Wash your hands often with soap and water. Use paper towels to dry your hands. If soap and water are not available, use hand sanitizer.  Try to avoid contact with other people for one week or as told by your health care provider. Contact a health care provider if:  Your symptoms do not improve with treatment or they get worse.  You have increased pain.  Your vision becomes blurry.  You have a fever.  You have facial pain, redness, or swelling.  You have yellow or green drainage  coming from your eye.  You have new symptoms. This information is not intended to replace advice given to you by your health care provider. Make sure you discuss any questions you have with your health care provider. Document Released: 09/04/2002 Document Revised: 01/10/2016 Document Reviewed: 12/30/2015 Elsevier Interactive Patient Education  2019 [employer].

## 2018-07-04 NOTE — Telephone Encounter (Signed)
Patient had an appointment today with Dr Ardyth Harps for eye irritation.  Patient states she is no longer taking Ambien.  Patient requests a refill of traZODone (DESYREL) 100 MG tablet. Okay to refill?

## 2018-07-18 ENCOUNTER — Ambulatory Visit: Payer: 59 | Admitting: Family Medicine

## 2018-09-12 ENCOUNTER — Telehealth: Payer: Self-pay

## 2018-09-12 NOTE — Telephone Encounter (Signed)
If she would like to be tested for ADHD she can call   [facility]  [phone]  She does not need a referral from me

## 2018-09-12 NOTE — Telephone Encounter (Signed)
Copied from CRM [id number]. Topic: General - Other >> Sep 12, 2018  3:45 PM Jaquita Rector A wrote: Reason for CRM: Patient called to say that she would like to be tested for ADHD due to her memory getting worst, dropping stuff and spend the day second guessing herself or looking for things she just put down among other issues. Ph# [phone]

## 2018-09-13 NOTE — Telephone Encounter (Signed)
Pt has been advised and will contact them to schedule appt.

## 2018-09-15 ENCOUNTER — Other Ambulatory Visit: Payer: Self-pay

## 2018-09-15 ENCOUNTER — Ambulatory Visit
Admission: RE | Admit: 2018-09-15 | Discharge: 2018-09-15 | Disposition: A | Discharge status: Home / Self Care | Payer: 59 | Source: Ambulatory Visit | Attending: Obstetrics and Gynecology | Admitting: Obstetrics and Gynecology

## 2018-09-15 ENCOUNTER — Ambulatory Visit: Payer: 59

## 2018-09-15 DIAGNOSIS — N6489 Other specified disorders of breast: Secondary | ICD-10-CM

## 2019-05-17 ENCOUNTER — Encounter: Payer: Self-pay | Admitting: Internal Medicine

## 2019-05-17 ENCOUNTER — Other Ambulatory Visit: Payer: Self-pay

## 2019-05-17 ENCOUNTER — Ambulatory Visit (INDEPENDENT_AMBULATORY_CARE_PROVIDER_SITE_OTHER): Payer: 59 | Admitting: Internal Medicine

## 2019-05-17 VITALS — BP 120/70 | HR 96 | Ht 70.0 in | Wt 193.0 lb

## 2019-05-17 DIAGNOSIS — E890 Postprocedural hypothyroidism: Secondary | ICD-10-CM | POA: Diagnosis not present

## 2019-05-17 DIAGNOSIS — R7309 Other abnormal glucose: Secondary | ICD-10-CM | POA: Diagnosis not present

## 2019-05-17 DIAGNOSIS — E282 Polycystic ovarian syndrome: Secondary | ICD-10-CM | POA: Diagnosis not present

## 2019-05-17 NOTE — Progress Notes (Signed)
Patient ID: Danielle [name], female   DOB: [birth year], 43 y.o.   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 Last visit 1 year ago.  She continues to work on her masters degree -will finish soon.   At last visit, she returned after period of increased stress as she separated from her husband.  Hypothyroidism: She has a history of medication noncompliance with levothyroxine.  Pt is on levothyroxine 125 mcg daily, taken: - in am - last month (prev. Tried it at night) - fasting - at least 20-30 min from b'fast - no Ca, Fe, MVI, PPIs - not on Biotin - + magnesium at night - + potassium citrate - Off and on multivitamins - lunchtime  At last visit, her TSH was elevated but she admitted to missing levothyroxine doses so I advised her to start taking them every day and then return to have the tests checked.  She did not return afterwards.  Reviewed her TFTs: Lab Results  Component Value Date   TSH 15.43 (H) 05/16/2018   TSH 2.70 05/13/2017   TSH 3.99 11/11/2016   TSH 4.67 (H) 07/13/2016   TSH 1.40 03/05/2016   TSH 8.27 (H) 11/10/2015   TSH 21.68 (H) 08/19/2015   TSH 7.83 (H) 11/12/2013   TSH 0.24 (L) 10/08/2013   TSH 0.85 07/03/2013   FREET4 0.68 05/16/2018   FREET4 1.07 05/13/2017   FREET4 0.87 11/11/2016   FREET4 0.92 07/13/2016   FREET4 1.34 03/05/2016   FREET4 0.80 11/10/2015   FREET4 0.54 (L) 08/19/2015   FREET4 0.18 (L) 08/15/2012   FREET4 1.87 (H) 07/04/2012   FREET4 3.32 (H) 06/12/2012  05/06/2014: (Dr. Dareen Piano, ObGyn): TSH 12.58  At last visit TSI's were undetectable: Lab Results  Component Value Date   TSI <89 05/16/2018    Pt denies: - feeling nodules in neck - hoarseness - dysphagia - choking - SOB with lying down She has + FH of thyroid disorders in: mother. No FH of thyroid cancer. No h/o radiation tx to head or  neck.  No herbal supplements. No Biotin use. No recent steroids use.   PCOS: -She developed heavy bleeding, after which we started OCPs in 2017, but continued to have increased bleeding so she was switched to NuvaRing, however, bleeding continued and she stopped this before last visit.  5 months ago she started to participate in a research trial with a form of oral contraceptive and her dysfunctional uterine bleeding has improved.  Review latest HbA1c -at last visit, it was in the prediabetic range: Lab Results  Component Value Date   HGBA1C 6.2 05/16/2018   Since last visit, she started exercise in the morning several times a week.  She also started to improve her diet and cut down alcohol.  ObGyn: Dr. Leone Payor.  ROS: Constitutional: no weight gain/no weight loss, no fatigue, no subjective hyperthermia, no subjective hypothermia Eyes: no blurry vision, no xerophthalmia ENT: no sore throat, + see HPI Cardiovascular: no CP/no SOB/no palpitations/no leg swelling Respiratory: no cough/no SOB/no wheezing Gastrointestinal: no N/no V/no D/no C/no acid reflux Musculoskeletal: no muscle aches/no joint aches Skin: no rashes, no hair loss Neurological: no tremors/no numbness/no tingling/no dizziness  I reviewed pt's medications, allergies, PMH, social hx, family hx, and changes were documented in the history of present illness. Otherwise, unchanged from my initial visit note.  Past Medical  History:  Diagnosis Date  . Asthma   . HPV in female   . Migraine   . Polycystic ovary syndrome   . Thyroid disease    Past Surgical History:  Procedure Laterality Date  . cyro     History   Social History  . Marital Status: Married    Spouse Name: N/A    Number of Children: 0   Occupational History  .    Social History Main Topics  . Smoking status: Former Games developermoker  . Smokeless tobacco: Not on file  . Alcohol Use: Yes  . Drug Use: No  . Sexual Activity: Yes    Birth Control/  Protection: Pill   Current Outpatient Medications on File Prior to Visit  Medication Sig Dispense Refill  . etonogestrel-ethinyl estradiol (NUVARING) 0.12-0.015 MG/24HR vaginal ring NuvaRing 0.12 mg -0.015 mg/24 hr vaginal  Insert 1 vaginal ring every month by vaginal route.    Marland Kitchen. levothyroxine (SYNTHROID, LEVOTHROID) 125 MCG tablet Take 1 tablet by mouth  daily 90 tablet 3  . traZODone (DESYREL) 100 MG tablet Take 1 tablet (100 mg total) by mouth at bedtime as needed for sleep. 90 tablet 1  . varenicline (CHANTIX STARTING MONTH PAK) 0.5 MG X 11 & 1 MG X 42 tablet Take one 0.5 mg tablet by mouth once daily for 3 days, then increase to one 0.5 mg tablet twice daily for 4 days, then increase to one 1 mg tablet twice daily. 53 tablet 0   No current facility-administered medications on file prior to visit.    Allergies  Allergen Reactions  . Allegra [Fexofenadine Hcl]     Swelling occurs  . Sulfa Antibiotics   . Aspirin Rash    Baby asprin; as a child, rash from dyes    Family History  Problem Relation Age of Onset  . Hyperlipidemia Mother   . Hypertension Mother   . Diabetes Mother   . Hypertension Sister   . Cancer Father        lung tumor/ lung removed  . Breast cancer Paternal Aunt   . Breast cancer Paternal Grandmother    PE: BP 120/70   Pulse 96   Ht 5\' 10"  (1.778 m)   Wt 193 lb (87.5 kg)   SpO2 97%   BMI 27.69 kg/m  Wt Readings from Last 3 Encounters:  05/17/19 193 lb (87.5 kg)  07/04/18 190 lb 9.6 oz (86.5 kg)  05/16/18 188 lb (85.3 kg)   Constitutional: overweight, in NAD Eyes: PERRLA, EOMI, no exophthalmos ENT: moist mucous membranes, no thyromegaly, no cervical lymphadenopathy Cardiovascular: Tachycardia RR, No MRG Respiratory: CTA B Gastrointestinal: abdomen soft, NT, ND, BS+ Musculoskeletal: no deformities, strength intact in all 4 Skin: moist, warm, no rashes Neurological: no tremor with outstretched hands, DTR normal in all 4  ASSESSMENT: 1.  Postablative Hypothyroidism - after RAI tx for Graves ds.  2.  History of Graves' disease  3. PCOS  PLAN:  1. Patient with longstanding hypothyroidism, previously noncompliant with levothyroxine.  At last visit, her TSH was elevated, at 15.43 and I suspect that due to noncompliance.  We did not change her dose of levothyroxine but I advised him to return for labs in 1.5 months.  She did not do so as she did not read the message in my chart.  We reviewed this together at this visit.  She will contact the helpline to be able to get access to MyChart again  - she continues on LT4 125 mcg  daily - pt feels good on this dose, without any complaints.  - we discussed about taking the thyroid hormone every day, with water, >30 minutes before breakfast, separated by >4 hours from acid reflux medications, calcium, iron, multivitamins. Pt. is taking it correctly but was forgetting doses in the past and at last visit we discussed about putting the dose on her nightstand. - will check thyroid tests today: TSH and fT4 - If labs are abnormal, she will need to return for repeat TFTs in 1.5 months  2.  History of Graves' disease -Appears resolved after her RAI treatment -She denies neck compression symptoms -No signs of Graves' ophthalmopathy, but she had blurry vision at last visit and we discussed about scheduling another appointment with her ophthalmologist.  She did so but there was no pathology in her blurry vision resolved since then. -At last visit, her TSI antibodies were undetectable-we will not repeat them today  3. PCOS - She has a history of heavy menstrual cycles off oral contraceptives.  We restarted her OCPs but bleeding got worse so we discussed about running this by OB/GYN to see if she needed a higher estrogen dose.  She was started on NuvaRing before our last visit, but she is now off. -now in a research trial with an OCP >> improved DUB -At this visit, we will recheck her HbA1c.  At last  visit, this was in the prediabetic range.  At that time, I sent her a message through my chart about the result and also ask her whether she would prefer referral to nutrition.  However, she did not to be the message.  At this visit, she agrees with the referral to nutrition - placed today.  Needs refills.  Office Visit on 05/17/2019  Component Date Value Ref Range Status  . TSH 05/17/2019 2.26  0.35 - 4.50 uIU/mL Final  . Free T4 05/17/2019 1.04  0.60 - 1.60 ng/dL Final   Comment: Specimens from patients who are undergoing biotin therapy and /or ingesting biotin supplements may contain high levels of biotin.  The higher biotin concentration in these specimens interferes with this Free T4 assay.  Specimens that contain high levels  of biotin may cause false high results for this Free T4 assay.  Please interpret results in light of the total clinical presentation of the patient.    . Hgb A1c MFr Bld 05/17/2019 6.4  4.6 - 6.5 % Final   Glycemic Control Guidelines for People with Diabetes:Non Diabetic:  <6%Goal of Therapy: <7%Additional Action Suggested:  >8%    TFTs are normal.  However, HbA1c is higher.  I referred her to nutrition.  We will recheck at next visit.  Philemon Kingdom, MD PhD [facility] Endocrinology

## 2019-05-17 NOTE — Patient Instructions (Addendum)
Please stop at the lab.  Please continue: - Levothyroxine 125 mcg daily  Take the thyroid hormone every day, with water, at least 30 minutes before breakfast, separated by at least 4 hours from: - acid reflux medications - calcium - iron - multivitamins  Please schedule an appt with Antonieta Iba with nutrition.  Please come back for a follow-up appointment in 6 months.

## 2019-05-18 ENCOUNTER — Encounter: Payer: Self-pay | Admitting: Internal Medicine

## 2019-05-18 LAB — TSH: TSH: 2.26 u[IU]/mL (ref 0.35–4.50)

## 2019-05-18 LAB — HEMOGLOBIN A1C: Hgb A1c MFr Bld: 6.4 % (ref 4.6–6.5)

## 2019-05-18 LAB — T4, FREE: Free T4: 1.04 ng/dL (ref 0.60–1.60)

## 2019-05-18 MED ORDER — LEVOTHYROXINE SODIUM 125 MCG PO TABS: ORAL_TABLET | ORAL | 3 refills | Status: DC

## 2019-06-16 ENCOUNTER — Encounter: Payer: Self-pay | Admitting: Adult Health

## 2019-06-19 NOTE — Telephone Encounter (Signed)
This pertains to NDMS-no action needed on our end

## 2019-07-12 ENCOUNTER — Encounter: Payer: Self-pay | Admitting: Internal Medicine

## 2019-10-25 ENCOUNTER — Encounter: Payer: Self-pay | Admitting: Adult Health

## 2019-10-26 ENCOUNTER — Encounter: Payer: Self-pay | Admitting: Family Medicine

## 2019-10-26 MED ORDER — ALBUTEROL SULFATE HFA 108 (90 BASE) MCG/ACT IN AERS
2.0000 | INHALATION_SPRAY | Freq: Four times a day (QID) | RESPIRATORY_TRACT | 0 refills | Status: DC | PRN
Start: 2019-10-26 — End: 2020-02-05

## 2019-11-01 ENCOUNTER — Ambulatory Visit: Payer: 59 | Admitting: Internal Medicine

## 2019-11-07 ENCOUNTER — Ambulatory Visit: Payer: 59 | Admitting: Adult Health

## 2019-11-07 DIAGNOSIS — Z0289 Encounter for other administrative examinations: Secondary | ICD-10-CM

## 2019-11-08 ENCOUNTER — Other Ambulatory Visit: Payer: Self-pay

## 2019-11-08 ENCOUNTER — Encounter: Payer: Self-pay | Admitting: Adult Health

## 2019-11-12 ENCOUNTER — Encounter: Payer: Self-pay | Admitting: Internal Medicine

## 2019-11-12 ENCOUNTER — Other Ambulatory Visit: Payer: Self-pay

## 2019-11-12 ENCOUNTER — Ambulatory Visit (INDEPENDENT_AMBULATORY_CARE_PROVIDER_SITE_OTHER): Payer: 59 | Admitting: Internal Medicine

## 2019-11-12 VITALS — BP 130/80 | HR 82 | Ht 70.0 in | Wt 184.0 lb

## 2019-11-12 DIAGNOSIS — R7303 Prediabetes: Secondary | ICD-10-CM

## 2019-11-12 DIAGNOSIS — E282 Polycystic ovarian syndrome: Secondary | ICD-10-CM | POA: Diagnosis not present

## 2019-11-12 DIAGNOSIS — E890 Postprocedural hypothyroidism: Secondary | ICD-10-CM | POA: Diagnosis not present

## 2019-11-12 DIAGNOSIS — Z8639 Personal history of other endocrine, nutritional and metabolic disease: Secondary | ICD-10-CM

## 2019-11-12 LAB — POCT GLYCOSYLATED HEMOGLOBIN (HGB A1C): Hemoglobin A1C: 6.2 % — AB (ref 4.0–5.6)

## 2019-11-12 NOTE — Patient Instructions (Addendum)
Please stop at the lab.  Please continue Levothyroxine 125 mcg daily.  Take the thyroid hormone every day, with water, at least 30 minutes before breakfast, separated by at least 4 hours from: - acid reflux medications - calcium - iron - multivitamins  Please come back for a follow-up appointment in 6 months.  

## 2019-11-12 NOTE — Progress Notes (Signed)
Patient ID: Danielle [name], female   DOB: [birth year], 44 y.o.   MRN: [id number]  This visit occurred during the SARS-CoV-2 public health emergency.  Safety protocols were in place, including screening questions prior to the visit, additional usage of staff PPE, and extensive cleaning of exam room while observing appropriate contact time as indicated for disinfecting solutions.   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 Last visit 6 months ago.  She had Covid 09/2019. She recovered but still has cough.  Hypothyroidism: She has a history of medication noncompliance with levothyroxine. In 04/2018, she was missing levothyroxine doses and her TSH was high (at that time she had increased stress as she separated from her husband).  However, at last visit, TSH returned normal on the same dose.  Pt is on levothyroxine 125 mcg daily, taken: - missed 2 doses in vacation - in am (previously at night) - fasting - at least 30 min from b'fast - no Ca, Fe, PPIs, + occasional multivitamins at lunchtime - not on Biotin  Reviewed her TFTs: Lab Results  Component Value Date   TSH 2.26 05/17/2019   TSH 15.43 (H) 05/16/2018   TSH 2.70 05/13/2017   TSH 3.99 11/11/2016   TSH 4.67 (H) 07/13/2016   TSH 1.40 03/05/2016   TSH 8.27 (H) 11/10/2015   TSH 21.68 (H) 08/19/2015   TSH 7.83 (H) 11/12/2013   TSH 0.24 (L) 10/08/2013   FREET4 1.04 05/17/2019   FREET4 0.68 05/16/2018   FREET4 1.07 05/13/2017   FREET4 0.87 11/11/2016   FREET4 0.92 07/13/2016   FREET4 1.34 03/05/2016   FREET4 0.80 11/10/2015   FREET4 0.54 (L) 08/19/2015   FREET4 0.18 (L) 08/15/2012   FREET4 1.87 (H) 07/04/2012  05/06/2014: (Dr. Dareen Piano, ObGyn): TSH 12.58  At last check, TSI's were undetectable: Lab Results  Component Value Date   TSI <89 05/16/2018    Pt denies: - feeling nodules in neck -  hoarseness - dysphagia - choking - SOB with lying down  She has + FH of thyroid disorders in: mother. No FH of thyroid cancer. No h/o radiation tx to head or neck.  No seaweed or kelp. No recent contrast studies. No herbal supplements. No Biotin use. No recent steroids use.   PCOS: -She developed heavy bleeding, after which we started OCPs in 2017, but continued to have increased bleeding so she was switched to NuvaRing, however, bleeding continued so she stopped it.  5 months before last visit, she started to participate in the research trial with a form of oral contraceptive.  Her bleeding improved.  The study finished in 08/2019.  ObGyn: Dr. Leone Payor.  Prediabetes:  HbA1c levels were in the prediabetic range: Lab Results  Component Value Date   HGBA1C 6.4 05/17/2019   HGBA1C 6.2 05/16/2018   HGBA1C 5.9 08/15/2012   At last visit, I referred her to nutrition.  No CKD: No results found for: BUN No results found for: CREATININE   No HL: No results found for: CHOL, HDL, LDLCALC, LDLDIRECT, TRIG, CHOLHDL  ROS: Constitutional: + weight gain/+ weight loss, no fatigue, no subjective hyperthermia, no subjective hypothermia Eyes: no blurry vision, no xerophthalmia ENT: no sore throat, + see HPI Cardiovascular: no CP/no SOB/no palpitations/no leg swelling Respiratory: + cough/no SOB/no wheezing Gastrointestinal: no N/no V/no D/no C/no acid reflux Musculoskeletal: no muscle aches/no joint aches Skin: no rashes, no  hair loss Neurological: no tremors/no numbness/no tingling/no dizziness  I reviewed pt's medications, allergies, PMH, social hx, family hx, and changes were documented in the history of present illness. Otherwise, unchanged from my initial visit note.  Past Medical History:  Diagnosis Date  . Asthma   . HPV in female   . Migraine   . Polycystic ovary syndrome   . Thyroid disease    Past Surgical History:  Procedure Laterality Date  . cyro     History    Social History  . Marital Status: Married    Spouse Name: N/A    Number of Children: 0   Occupational History  .    Social History Main Topics  . Smoking status: Former Research scientist (life sciences)  . Smokeless tobacco: Not on file  . Alcohol Use: Yes  . Drug Use: No  . Sexual Activity: Yes    Birth Control/ Protection: Pill   Current Outpatient Medications on File Prior to Visit  Medication Sig Dispense Refill  . albuterol (VENTOLIN HFA) 108 (90 Base) MCG/ACT inhaler Inhale 2 puffs into the lungs every 6 (six) hours as needed for shortness of breath. 18 g 0  . etonogestrel-ethinyl estradiol (NUVARING) 0.12-0.015 MG/24HR vaginal ring NuvaRing 0.12 mg -0.015 mg/24 hr vaginal  Insert 1 vaginal ring every month by vaginal route.    Marland Kitchen levothyroxine (SYNTHROID) 125 MCG tablet Take 1 tablet by mouth  daily 90 tablet 3  . traZODone (DESYREL) 100 MG tablet Take 1 tablet (100 mg total) by mouth at bedtime as needed for sleep. 90 tablet 1  . varenicline (CHANTIX STARTING MONTH PAK) 0.5 MG X 11 & 1 MG X 42 tablet Take one 0.5 mg tablet by mouth once daily for 3 days, then increase to one 0.5 mg tablet twice daily for 4 days, then increase to one 1 mg tablet twice daily. 53 tablet 0   No current facility-administered medications on file prior to visit.   Allergies  Allergen Reactions  . Allegra [Fexofenadine Hcl]     Swelling occurs  . Sulfa Antibiotics   . Aspirin Rash    Baby asprin; as a child, rash from dyes    Family History  Problem Relation Age of Onset  . Hyperlipidemia Mother   . Hypertension Mother   . Diabetes Mother   . Hypertension Sister   . Cancer Father        lung tumor/ lung removed  . Breast cancer Paternal Aunt   . Breast cancer Paternal Grandmother    PE: BP 130/80   Pulse 82   Ht 5\' 10"  (1.778 m)   Wt 184 lb (83.5 kg)   SpO2 99%   BMI 26.40 kg/m  Wt Readings from Last 3 Encounters:  11/12/19 184 lb (83.5 kg)  05/17/19 193 lb (87.5 kg)  07/04/18 190 lb 9.6 oz (86.5 kg)    Constitutional: overweight, in NAD Eyes: PERRLA, EOMI, no exophthalmos ENT: moist mucous membranes, no thyromegaly, no cervical lymphadenopathy Cardiovascular: RRR, No MRG Respiratory: CTA B Gastrointestinal: abdomen soft, NT, ND, BS+ Musculoskeletal: no deformities, strength intact in all 4 Skin: moist, warm, no rashes Neurological: no tremor with outstretched hands, DTR normal in all 4  ASSESSMENT: 1. Postablative Hypothyroidism - after RAI tx for Graves ds.  2.  History of Graves' disease  3. PCOS  4.  Prediabetes  PLAN:  1. Patient with longstanding hypothyroidism,  previously noncompliant with levothyroxine, now with better compliance, but the dose is missed in the last month. -  latest thyroid labs reviewed with pt >> normal: Lab Results  Component Value Date   TSH 2.26 05/17/2019   - she continues on LT4 25 mcg daily - pt feels good on this dose. - we discussed about taking the thyroid hormone every day, with water, >30 minutes before breakfast, separated by >4 hours from acid reflux medications, calcium, iron, multivitamins. Pt. is taking it correctly. - will check thyroid tests today: TSH and fT4 - If labs are abnormal, she will need to return for repeat TFTs in 1.5 months  2.  History of Graves' disease -Resolved after her RAI treatment -No neck compression symptoms -No signs of Graves' ophthalmopathy but she had blurry vision in the past, which resolved. -Latest TSI antibody level was undetectable.  We will not repeat them today  3. PCOS -She has a history of heavy menstrual cycles oral contraceptives.  We restarted her OCPs but bleeding got worse.  She saw OB/GYN and started NuvaRing, but came off before our last visit.  At that time, she started to participate in a research trial with an OCP and she had improved DUB.  The study is now finished. -She needs a refill on OCPs, however, due to previous history of increased bleeding, I recommended her to see OB/GYN  for this.  4.  Prediabetes -HbA1c levels are in the prediabetic range.  At last time, this was higher and I referred her to nutrition -she did not have the appointment as her insurance is not paying for it.  However, she started to improve her diet -At today's visit, HbA1c is 6.2%, improved -We will check CMP, lipids, ACR today  Component     Latest Ref Rng & Units 11/12/2019  Sodium     135 - 145 mEq/L 138  Potassium     3.5 - 5.1 mEq/L 3.5  Chloride     96 - 112 mEq/L 106  CO2     19 - 32 mEq/L 25  Glucose     70 - 99 mg/dL 99  BUN     6 - 23 mg/dL 13  Creatinine     5.40 - 1.20 mg/dL 0.86  Total Bilirubin     0.2 - 1.2 mg/dL 0.3  Alkaline Phosphatase     39 - 117 U/L 47  AST     0 - 37 U/L 13  ALT     0 - 35 U/L 15  Total Protein     6.0 - 8.3 g/dL 7.0  Albumin     3.5 - 5.2 g/dL 3.9  GFR     >76.19 mL/min 106.86  Calcium     8.4 - 10.5 mg/dL 8.6  Cholesterol     0 - 200 mg/dL 509 (H)  Triglycerides     0.0 - 149.0 mg/dL 326.7  HDL Cholesterol     >39.00 mg/dL 12.45  VLDL     0.0 - 80.9 mg/dL 98.3  LDL (calc)     0 - 99 mg/dL 382 (H)  Total CHOL/HDL Ratio      4  NonHDL      180.11  Microalb, Ur     0.0 - 1.9 mg/dL 0.8  Creatinine,U     mg/dL 505.3  MICROALB/CREAT RATIO     0.0 - 30.0 mg/g 0.7  TSH     0.35 - 4.50 uIU/mL 2.89  T4,Free(Direct)     0.60 - 1.60 ng/dL 9.76   LDL is quite high.  I would suggest a  statin.  We will check with the patient.  Carlus Pavlov, MD PhD [facility] Endocrinology

## 2019-11-13 LAB — LIPID PANEL
Cholesterol: 234 mg/dL — ABNORMAL HIGH (ref 0–200)
HDL: 53.6 mg/dL (ref >39.00)
LDL Cholesterol: 160 mg/dL — ABNORMAL HIGH (ref 0–99)
NonHDL: 180.11
Total CHOL/HDL Ratio: 4
Triglycerides: 100 mg/dL (ref 0.0–149.0)
VLDL: 20 mg/dL (ref 0.0–40.0)

## 2019-11-13 LAB — COMPREHENSIVE METABOLIC PANEL
ALT: 15 U/L (ref 0–35)
AST: 13 U/L (ref 0–37)
Albumin: 3.9 g/dL (ref 3.5–5.2)
Alkaline Phosphatase: 47 U/L (ref 39–117)
BUN: 13 mg/dL (ref 6–23)
CO2: 25 mEq/L (ref 19–32)
Calcium: 8.6 mg/dL (ref 8.4–10.5)
Chloride: 106 mEq/L (ref 96–112)
Creatinine, Ser: 0.72 mg/dL (ref 0.40–1.20)
GFR: 106.86 mL/min (ref >60.00)
Glucose, Bld: 99 mg/dL (ref 70–99)
Potassium: 3.5 mEq/L (ref 3.5–5.1)
Sodium: 138 mEq/L (ref 135–145)
Total Bilirubin: 0.3 mg/dL (ref 0.2–1.2)
Total Protein: 7 g/dL (ref 6.0–8.3)

## 2019-11-13 LAB — TSH: TSH: 2.89 u[IU]/mL (ref 0.35–4.50)

## 2019-11-13 LAB — T4, FREE: Free T4: 1.18 ng/dL (ref 0.60–1.60)

## 2019-11-13 LAB — MICROALBUMIN / CREATININE URINE RATIO
Creatinine,U: 110 mg/dL
Microalb Creat Ratio: 0.7 mg/g (ref 0.0–30.0)
Microalb, Ur: 0.8 mg/dL (ref 0.0–1.9)

## 2019-11-14 ENCOUNTER — Encounter: Payer: Self-pay | Admitting: Internal Medicine

## 2019-11-29 ENCOUNTER — Telehealth: Payer: Self-pay | Admitting: Adult Health

## 2019-11-29 NOTE — Telephone Encounter (Signed)
Pt would like a refill on Desyrel 100 mg. Pt has scheduled an appt on 12/06/19 with [facility]. Pt uses [facility] [phone]

## 2019-11-29 NOTE — Telephone Encounter (Signed)
Tried to return call.  Received a message that stated, "your call cannot be completed as dialed."  Will try again at a later time.  Pt will need to be seen in the office before refills.  Not seen in 2 years.

## 2019-11-30 ENCOUNTER — Other Ambulatory Visit: Payer: Self-pay | Admitting: Family Medicine

## 2019-11-30 DIAGNOSIS — Z30011 Encounter for initial prescription of contraceptive pills: Secondary | ICD-10-CM

## 2019-11-30 NOTE — Telephone Encounter (Signed)
Spoke to the pt.  Advised that it has been over 2 years since she was last seen and that she would need to be evaluated by [facility].  Pt was on the schedule for next Friday but has moved up her appointment to this coming Tuesday in the hopes of getting a 30 day supply of Bupropion 75 mg/ 100 mg or Bupropion SR 100 mg, 150 mg, 200 mg (until seen) because she has started smoking again.  She is also requesting a birth control from the $4 plan at [facility].  Those include Norethindrone 0.35 mg, Sprintec 0.25 mg/0.035 mg and Tri-Sprintec.  Last, she would like a month supply of Valtrex for her genital herpes.  Last prescribed by her gynecologist.  Would like all sent to [facility] on [address].  Will forward to [facility].

## 2019-11-30 NOTE — Telephone Encounter (Signed)
She will need to wait until being seen. Will need urine pregnancy before birth control can be prescribed. Also would like to talk to her about her smoking before we prescribe any medication

## 2019-11-30 NOTE — Telephone Encounter (Signed)
Spoke to the pt and advised of below message.  I have placed her on the lab schedule for a POC HCG.  Informed her Kandee Keen will discuss the result with her at her visit on Tuesday.  Nothing further needed.

## 2019-12-03 ENCOUNTER — Other Ambulatory Visit: Payer: Self-pay

## 2019-12-03 ENCOUNTER — Other Ambulatory Visit (INDEPENDENT_AMBULATORY_CARE_PROVIDER_SITE_OTHER): Payer: 59

## 2019-12-03 DIAGNOSIS — Z30011 Encounter for initial prescription of contraceptive pills: Secondary | ICD-10-CM | POA: Diagnosis not present

## 2019-12-03 LAB — POCT URINE PREGNANCY: Preg Test, Ur: NEGATIVE

## 2019-12-04 ENCOUNTER — Encounter: Payer: Self-pay | Admitting: Adult Health

## 2019-12-04 ENCOUNTER — Telehealth (INDEPENDENT_AMBULATORY_CARE_PROVIDER_SITE_OTHER): Payer: 59 | Admitting: Adult Health

## 2019-12-04 VITALS — Wt 182.0 lb

## 2019-12-04 DIAGNOSIS — Z30011 Encounter for initial prescription of contraceptive pills: Secondary | ICD-10-CM | POA: Diagnosis not present

## 2019-12-04 DIAGNOSIS — B009 Herpesviral infection, unspecified: Secondary | ICD-10-CM

## 2019-12-04 DIAGNOSIS — Z720 Tobacco use: Secondary | ICD-10-CM | POA: Diagnosis not present

## 2019-12-04 MED ORDER — NORETHINDRONE 0.35 MG PO TABS: 1.0000 | ORAL_TABLET | Freq: Every day | ORAL | 5 refills | Status: DC

## 2019-12-04 MED ORDER — VALACYCLOVIR HCL 1 G PO TABS: 1000.0000 mg | ORAL_TABLET | Freq: Every day | ORAL | 3 refills | Status: DC

## 2019-12-04 MED ORDER — BUPROPION HCL ER (SR) 150 MG PO TB12: 150.0000 mg | ORAL_TABLET | Freq: Every day | ORAL | 1 refills | Status: DC

## 2019-12-04 NOTE — Progress Notes (Signed)
Virtual Visit via Video Note  I connected with Danielle [name] on 12/04/19 at 10:00 AM EDT by a video enabled telemedicine application and verified that I am speaking with the correct person using two identifiers.  Location patient: home Location provider:work or home office Persons participating in the virtual visit: patient, provider  I discussed the limitations of evaluation and management by telemedicine and the availability of in person appointments. The patient expressed understanding and agreed to proceed.   HPI: 44 year old female who  has a past medical history of Asthma, HPV in female, Migraine, Polycystic ovary syndrome, and Thyroid disease.  She is being evaluated today for multiple issues.  Tobacco Use -in the past she was able to quit smoking with Wellbutrin.  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  She smokes about 2 cigarettes a day during the week and 2 to 4 cigarettes a day during the weekends.  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  In the past she has received this from gynecology, but she has a bill at their office and is not able to return at this time.  Birth Control Options -her gynecologist has prescribed her NuvaRing but she is unable to afford this as insurance will not pay and is upwards of $400.  In the past she has been on oral birth control pills as well as injectable birth control.  She would like to go back on oral birth control.  She is in a new relationship and has heavy menstrual cycles.   ROS: See pertinent positives and negatives per HPI.  Past Medical History:  Diagnosis Date   Asthma    HPV in female    Migraine    Polycystic ovary syndrome    Thyroid disease     Past Surgical History:  Procedure Laterality Date   cyro      Family History  Problem  Relation Age of Onset   Hyperlipidemia Mother    Hypertension Mother    Diabetes Mother    Hypertension Sister    Cancer Father        lung tumor/ lung removed   Breast cancer Paternal Aunt    Breast cancer Paternal Grandmother        Current Outpatient Medications:    buPROPion (WELLBUTRIN SR) 150 MG 12 hr tablet, Take 150 mg by mouth daily., Disp: , Rfl:    levothyroxine (SYNTHROID) 125 MCG tablet, Take 1 tablet by mouth  daily, Disp: 90 tablet, Rfl: 3   traZODone (DESYREL) 100 MG tablet, Take 1 tablet (100 mg total) by mouth at bedtime as needed for sleep., Disp: 90 tablet, Rfl: 1   albuterol (VENTOLIN HFA) 108 (90 Base) MCG/ACT inhaler, Inhale 2 puffs into the lungs every 6 (six) hours as needed for shortness of breath. (Patient not taking: Reported on 12/04/2019), Disp: 18 g, Rfl: 0   valACYclovir (VALTREX) 500 MG tablet, Take 500 mg by mouth 2 (two) times daily as needed., Disp: , Rfl:   EXAM:  VITALS per patient if applicable:  GENERAL: alert, oriented, appears well and in no acute distress  HEENT: atraumatic, conjunttiva clear, no obvious abnormalities on inspection of external nose and ears  NECK: normal movements of the head and neck  LUNGS: on inspection no signs of respiratory distress, breathing rate appears normal,  no obvious gross SOB, gasping or wheezing  CV: no obvious cyanosis  MS: moves all visible extremities without noticeable abnormality  PSYCH/NEURO: pleasant and cooperative, no obvious depression or anxiety, speech and thought processing grossly intact  ASSESSMENT AND PLAN:  Discussed the following assessment and plan:  1. Encounter for BCP (birth control pills) initial prescription -We discussed options for birth control methods.  Being that she is 44 years old and is a smoker with oral birth control she has a higher incidence of blood clots.  She understands this risk and is planning on quitting smoking again and with the Wellbutrin.  We  will place her on progesterone only birth control pill. - norethindrone (ORTHO MICRONOR) 0.35 MG tablet; Take 1 tablet (0.35 mg total) by mouth daily.  Dispense: 1 Package; Refill: 5  2. HSV-2 (herpes simplex virus 2) infection  - valACYclovir (VALTREX) 1000 MG tablet; Take 1 tablet (1,000 mg total) by mouth daily.  Dispense: 90 tablet; Refill: 3  3. Tobacco use  - buPROPion (WELLBUTRIN SR) 150 MG 12 hr tablet; Take 1 tablet (150 mg total) by mouth daily.  Dispense: 90 tablet; Refill: 1     I discussed the assessment and treatment plan with the patient. The patient was provided an opportunity to ask questions and all were answered. The patient agreed with the plan and demonstrated an understanding of the instructions.   The patient was advised to call back or seek an in-person evaluation if the symptoms worsen or if the condition fails to improve as anticipated.   Shirline Frees, NP

## 2019-12-06 ENCOUNTER — Ambulatory Visit: Payer: 59 | Admitting: Adult Health

## 2020-01-29 ENCOUNTER — Encounter: Payer: Self-pay | Admitting: Family Medicine

## 2020-01-29 ENCOUNTER — Other Ambulatory Visit: Payer: Self-pay | Admitting: Adult Health

## 2020-01-29 ENCOUNTER — Telehealth: Payer: Self-pay | Admitting: Adult Health

## 2020-01-29 ENCOUNTER — Telehealth (INDEPENDENT_AMBULATORY_CARE_PROVIDER_SITE_OTHER): Payer: 59 | Admitting: Family Medicine

## 2020-01-29 DIAGNOSIS — N898 Other specified noninflammatory disorders of vagina: Secondary | ICD-10-CM | POA: Diagnosis not present

## 2020-01-29 DIAGNOSIS — L292 Pruritus vulvae: Secondary | ICD-10-CM

## 2020-01-29 DIAGNOSIS — G4700 Insomnia, unspecified: Secondary | ICD-10-CM

## 2020-01-29 MED ORDER — FLUCONAZOLE 150 MG PO TABS
150.0000 mg | ORAL_TABLET | Freq: Once | ORAL | 0 refills | Status: AC
Start: 2020-01-29 — End: 2020-01-29

## 2020-01-29 NOTE — Patient Instructions (Addendum)
-  I sent the medication(s) we discussed to your pharmacy: Meds ordered this encounter  Medications  . fluconazole (DIFLUCAN) 150 MG tablet    Sig: Take 1 tablet (150 mg total) by mouth once for 1 dose.    Dispense:  1 tablet    Refill:  0    Please let us know if you have any questions or concerns regarding this prescription.  I hope you are feeling better soon! Seek inperson care with your primary care provider or gynecologist  promptly if your symptoms worsen, new concerns arise or you are not improving with treatment over the next few days.

## 2020-01-29 NOTE — Telephone Encounter (Signed)
error 

## 2020-01-29 NOTE — Progress Notes (Signed)
Virtual Visit via Video Note  I connected with "Danielle [name]"  on 01/29/20 at 12:40 PM EDT by a video enabled telemedicine application and verified that I am speaking with the correct person using two identifiers.  Location patient: home, [place] Location provider:work or home office Persons participating in the virtual visit: patient, provider  I discussed the limitations of evaluation and management by telemedicine and the availability of in person appointments. The patient expressed understanding and agreed to proceed.   HPI:  Acute visit for Vaginal Discharge "yeast infection": -started about 1 week ago -symptoms include: vulvovag pruritis, some thick curdy white discharge -has had similar symptoms in the past cleared with  Monistat or Diflucan - prefers the Diflucan one day pill for convenience and called today for Rx -denies recent abx, steroids or sugar issues -she has been riding her motorcycle more often - wears jeans and in hot weather this seems to cause this issue -denies any concerns for STIs, new sexual partners, abd or pelvic pain, odor, dysuria, malaise, fevers or any other symptoms -FDLMP: about 3 weeks ago  ROS: See pertinent positives and negatives per HPI.  Past Medical History:  Diagnosis Date   Asthma    HPV in female    Migraine    Polycystic ovary syndrome    Thyroid disease     Past Surgical History:  Procedure Laterality Date   cyro      Family History  Problem Relation Age of Onset   Hyperlipidemia Mother    Hypertension Mother    Diabetes Mother    Hypertension Sister    Cancer Father        lung tumor/ lung removed   Breast cancer Paternal Aunt    Breast cancer Paternal Grandmother     SOCIAL HX: see hpi   Current Outpatient Medications:    albuterol (VENTOLIN HFA) 108 (90 Base) MCG/ACT inhaler, Inhale 2 puffs into the lungs every 6 (six) hours as needed for shortness of breath., Disp: 18 g, Rfl: 0   buPROPion (WELLBUTRIN SR) 150  MG 12 hr tablet, Take 1 tablet (150 mg total) by mouth daily., Disp: 90 tablet, Rfl: 1   levothyroxine (SYNTHROID) 125 MCG tablet, Take 1 tablet by mouth  daily, Disp: 90 tablet, Rfl: 3   norethindrone (ORTHO MICRONOR) 0.35 MG tablet, Take 1 tablet (0.35 mg total) by mouth daily., Disp: 1 Package, Rfl: 5   traZODone (DESYREL) 100 MG tablet, Take 1 tablet (100 mg total) by mouth at bedtime as needed for sleep., Disp: 90 tablet, Rfl: 1   valACYclovir (VALTREX) 1000 MG tablet, Take 1 tablet (1,000 mg total) by mouth daily., Disp: 90 tablet, Rfl: 3   fluconazole (DIFLUCAN) 150 MG tablet, Take 1 tablet (150 mg total) by mouth once for 1 dose., Disp: 1 tablet, Rfl: 0  EXAM:  VITALS per patient if applicable:  GENERAL: alert, oriented, appears well and in no acute distress  HEENT: atraumatic, conjunttiva clear, no obvious abnormalities on inspection of external nose and ears  NECK: normal movements of the head and neck  LUNGS: on inspection no signs of respiratory distress, breathing rate appears normal, no obvious gross SOB, gasping or wheezing  CV: no obvious cyanosis  MS: moves all visible extremities without noticeable abnormality  PSYCH/NEURO: pleasant and cooperative, no obvious depression or anxiety, speech and thought processing grossly intact  ASSESSMENT AND PLAN:  Discussed the following assessment and plan:  Vulvovaginal pruritus  Vaginal discharge  -we discussed possible serious and likely etiologies,  options for evaluation and workup, limitations of telemedicine visit vs in person visit, treatment, treatment risks and precautions. Pt prefers to treat via telemedicine empirically rather then risking or undertaking an in person visit at this moment. Query yeast vaginitis vs other. Patient prefers to try empiric treatment with Diflucan 150mg  once vs testing/evaluation. She does agree to seel inperson visit with PCP or gyn if worsening, new symptoms arise, or if is not  improving with treatment over the next few days.   I discussed the assessment and treatment plan with the patient. The patient was provided an opportunity to ask questions and all were answered. The patient agreed with the plan and demonstrated an understanding of the instructions.   The patient was advised to call back or seek an in-person evaluation if the symptoms worsen or if the condition fails to improve as anticipated.   , DO

## 2020-02-01 ENCOUNTER — Other Ambulatory Visit: Payer: Self-pay | Admitting: Adult Health

## 2020-02-01 ENCOUNTER — Telehealth: Payer: Self-pay | Admitting: Adult Health

## 2020-02-01 MED ORDER — FLUCONAZOLE 150 MG PO TABS
150.0000 mg | ORAL_TABLET | Freq: Once | ORAL | 0 refills | Status: AC
Start: 2020-02-01 — End: 2020-02-01

## 2020-02-01 NOTE — Telephone Encounter (Signed)
Will send in another course of diflucan

## 2020-02-01 NOTE — Telephone Encounter (Signed)
Pt saw Kim on 8/3 for a yeast infection. Kim prescribed fluconazole and it helped a lil but she is still itchy and the skin is kinda raw with some white discharge that is still in the vulva. She said she did start her menstrual today and thought it was a bacterial infection so she had some old Metronidazole she took and helped with some itchiness but still itchy. Pt is wondering what to do from here  She does have an appt on 8/10 with PCP Sending message to both Heloise Beecham (PCP)  Pt can be reached at [phone]

## 2020-02-04 MED ORDER — FLUCONAZOLE 150 MG PO TABS
150.0000 mg | ORAL_TABLET | Freq: Once | ORAL | 0 refills | Status: AC
Start: 2020-02-04 — End: 2020-02-04

## 2020-02-04 NOTE — Addendum Note (Signed)
Addended by: Raj Janus T on: 02/04/2020 02:23 PM   Modules accepted: Orders

## 2020-02-04 NOTE — Telephone Encounter (Signed)
Left a message for a return call.

## 2020-02-04 NOTE — Telephone Encounter (Signed)
Pt notified that rx has been sent to the pharmacy.  Nothing further needed. 

## 2020-02-05 ENCOUNTER — Ambulatory Visit (INDEPENDENT_AMBULATORY_CARE_PROVIDER_SITE_OTHER): Payer: 59 | Admitting: Adult Health

## 2020-02-05 ENCOUNTER — Encounter: Payer: Self-pay | Admitting: Adult Health

## 2020-02-05 ENCOUNTER — Other Ambulatory Visit: Payer: Self-pay

## 2020-02-05 VITALS — BP 120/84 | Temp 98.7°F | Ht 69.0 in | Wt 183.0 lb

## 2020-02-05 DIAGNOSIS — E890 Postprocedural hypothyroidism: Secondary | ICD-10-CM

## 2020-02-05 DIAGNOSIS — Z720 Tobacco use: Secondary | ICD-10-CM

## 2020-02-05 DIAGNOSIS — Z0000 Encounter for general adult medical examination without abnormal findings: Secondary | ICD-10-CM

## 2020-02-05 DIAGNOSIS — G4700 Insomnia, unspecified: Secondary | ICD-10-CM | POA: Diagnosis not present

## 2020-02-05 NOTE — Progress Notes (Signed)
 Subjective:    Patient ID: Danielle [name], female    DOB: 12/14/1975, 44 y.o.   MRN: [id number]  HPI Patient presents for yearly preventative medicine examination. She is a pleasant 44 year old female who  has a past medical history of Asthma, HPV in female, Migraine, Polycystic ovary syndrome, and Thyroid disease.  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    Hypothyroidism -post ablation, diagnosed in October 2013 after RAI treatment for Graves' disease and PCOS.  Takes Synthroid 125 mcg.  She is managed by Dr. Gherghe  Insomnia- uses Trazodone 100 mg QHS. She feels as though she sleeps well for the most part   Tobacco Use - she was started back on Wellbutrin two months ago to help her quit smoking. She reports today that she has not been taking Wellbutrin every night. She may smoke a few cigarettes per week.    All immunizations and health maintenance protocols were reviewed with the patient and needed orders were placed. Has not had her Covid vaccination yet.   Appropriate screening laboratory values were ordered for the patient including screening of hyperlipidemia, renal function and hepatic function.  Medication reconciliation,  past medical history, social history, problem list and allergies were reviewed in detail with the patient  Goals were established with regard to weight loss, exercise, and  diet in compliance with medications  She is up to date on routine GYN exams. Is taking oral BC for heavy periods.    Review of Systems  Constitutional: Negative.   HENT: Negative.   Eyes: Negative.   Respiratory: Negative.   Cardiovascular: Negative.   Gastrointestinal: Negative.   Genitourinary: Negative.   Musculoskeletal: Negative.   Allergic/Immunologic: Negative.   Neurological: Negative.   Hematological: Negative.   Psychiatric/Behavioral: Negative.   All other  systems reviewed and are negative.  Past Medical History:  Diagnosis Date  . Asthma   . HPV in female   . Migraine   . Polycystic ovary syndrome   . Thyroid disease     Social History   Socioeconomic History  . Marital status: Single    Spouse name: Not on file  . Number of children: Not on file  . Years of education: Not on file  . Highest education level: Not on file  Occupational History  . Not on file  Tobacco Use  . Smoking status: Current Some Day Smoker    Last attempt to quit: 06/24/2014    Years since quitting: 5.6  . Smokeless tobacco: Never Used  Substance and Sexual Activity  . Alcohol use: Yes    Alcohol/week: 0.0 standard drinks    Comment: ocassional  . Drug use: No  . Sexual activity: Yes    Birth control/protection: Pill  Other Topics Concern  . Not on file  Social History Narrative  . Not on file   Social Determinants of Health   Financial Resource Strain:   . Difficulty of Paying Living Expenses:   Food Insecurity:   . Worried About Running Out of Food in the Last Year:   . Ran Out of Food in the Last Year:   Transportation Needs:   . Lack of Transportation (Medical):   . Lack of Transportation (Non-Medical):   Physical Activity:   . Days of Exercise per Week:   . Minutes of Exercise per Session:   Stress:   .   Feeling of Stress :   Social Connections:   . Frequency of Communication with Friends and Family:   . Frequency of Social Gatherings with Friends and Family:   . Attends Religious Services:   . Active Member of Clubs or Organizations:   . Attends Archivist Meetings:   Marland Kitchen Marital Status:   Intimate Partner Violence:   . Fear of Current or Ex-Partner:   . Emotionally Abused:   Marland Kitchen Physically Abused:   . Sexually Abused:     Past Surgical History:  Procedure Laterality Date  . cyro      Family History  Problem Relation Age of Onset  . Hyperlipidemia Mother   . Hypertension Mother   . Diabetes Mother   .  Hypertension Sister   . Cancer Father        lung tumor/ lung removed  . Breast cancer Paternal Aunt   . Breast cancer Paternal Grandmother     Allergies  Allergen Reactions  . Allegra [Fexofenadine Hcl]     Swelling occurs  . Sulfa Antibiotics   . Aspirin Rash    Baby asprin; as a child, rash from dyes     Current Outpatient Medications on File Prior to Visit  Medication Sig Dispense Refill  . buPROPion (WELLBUTRIN SR) 150 MG 12 hr tablet Take 1 tablet (150 mg total) by mouth daily. 90 tablet 1  . levothyroxine (SYNTHROID) 125 MCG tablet Take 1 tablet by mouth  daily 90 tablet 3  . norethindrone (ORTHO MICRONOR) 0.35 MG tablet Take 1 tablet (0.35 mg total) by mouth daily. 1 Package 5  . traZODone (DESYREL) 100 MG tablet Take 1 tablet (100 mg total) by mouth at bedtime as needed. Need physical exam for further refills 30 tablet 0  . valACYclovir (VALTREX) 1000 MG tablet Take 1 tablet (1,000 mg total) by mouth daily. (Patient not taking: Reported on 02/05/2020) 90 tablet 3   No current facility-administered medications on file prior to visit.    BP 120/84   Temp 98.7 F (37.1 C) (Oral)   Ht 5' 9" (1.753 m)   Wt 183 lb (83 kg)   BMI 27.02 kg/m       Objective:   Physical Exam Vitals and nursing note reviewed.  Constitutional:      Appearance: Normal appearance.  HENT:     Right Ear: Tympanic membrane normal.     Left Ear: Tympanic membrane normal.     Nose: Nose normal.     Mouth/Throat:     Mouth: Mucous membranes are moist.     Pharynx: Oropharynx is clear.  Cardiovascular:     Rate and Rhythm: Normal rate and regular rhythm.     Pulses: Normal pulses.     Heart sounds: Normal heart sounds.  Pulmonary:     Effort: Pulmonary effort is normal.     Breath sounds: Normal breath sounds.  Abdominal:     General: Abdomen is flat. Bowel sounds are normal.     Palpations: Abdomen is soft.  Skin:    Capillary Refill: Capillary refill takes less than 2 seconds.   Neurological:     General: No focal deficit present.     Mental Status: She is alert and oriented to person, place, and time.  Psychiatric:        Mood and Affect: Mood normal.        Behavior: Behavior normal.        Thought Content: Thought content normal.  Judgment: Judgment normal.        Assessment & Plan:  1. Routine general medical examination at a health care facility - Continue to eat healthy and exercise - Needs to quit smoking  - Follow up in one year or sooner if needed - get Covid vaccination  - CBC with Differential/Platelet; Future - Lipid panel; Future - TSH; Future - CMP with eGFR([facility]); Future - CMP with eGFR([facility]) - TSH - Lipid panel - CBC with Differential/Platelet  2. Tobacco use - Take Wellbutrin daily  - Needs to quit smoking completely.   3. Insomnia, unspecified type - Continue with Trazodone as needed  4. Postablative hypothyroidism - Follow up with Dr. Gherge as directed   Cory Nafziger, NP  

## 2020-02-06 LAB — COMPLETE METABOLIC PANEL WITH GFR
AG Ratio: 1.6 (calc) (ref 1.0–2.5)
ALT: 13 U/L (ref 6–29)
AST: 12 U/L (ref 10–30)
Albumin: 4.3 g/dL (ref 3.6–5.1)
Alkaline phosphatase (APISO): 49 U/L (ref 31–125)
BUN: 10 mg/dL (ref 7–25)
CO2: 20 mmol/L (ref 20–32)
Calcium: 8.9 mg/dL (ref 8.6–10.2)
Chloride: 105 mmol/L (ref 98–110)
Creat: 0.81 mg/dL (ref 0.50–1.10)
GFR, Est African American: 103 mL/min/{1.73_m2} (ref >=60)
GFR, Est Non African American: 89 mL/min/{1.73_m2} (ref >=60)
Globulin: 2.7 g/dL (calc) (ref 1.9–3.7)
Glucose, Bld: 92 mg/dL (ref 65–99)
Potassium: 3.5 mmol/L (ref 3.5–5.3)
Sodium: 137 mmol/L (ref 135–146)
Total Bilirubin: 0.4 mg/dL (ref 0.2–1.2)
Total Protein: 7 g/dL (ref 6.1–8.1)

## 2020-02-06 LAB — CBC WITH DIFFERENTIAL/PLATELET
Absolute Monocytes: 490 cells/uL (ref 200–950)
Basophils Absolute: 51 cells/uL (ref 0–200)
Basophils Relative: 1 %
Eosinophils Absolute: 97 cells/uL (ref 15–500)
Eosinophils Relative: 1.9 %
HCT: 34.2 % — ABNORMAL LOW (ref 35.0–45.0)
Hemoglobin: 10.7 g/dL — ABNORMAL LOW (ref 11.7–15.5)
Lymphs Abs: 2627 cells/uL (ref 850–3900)
MCH: 27.2 pg (ref 27.0–33.0)
MCHC: 31.3 g/dL — ABNORMAL LOW (ref 32.0–36.0)
MCV: 87 fL (ref 80.0–100.0)
MPV: 11.8 fL (ref 7.5–12.5)
Monocytes Relative: 9.6 %
Neutro Abs: 1836 cells/uL (ref 1500–7800)
Neutrophils Relative %: 36 %
Platelets: 296 10*3/uL (ref 140–400)
RBC: 3.93 10*6/uL (ref 3.80–5.10)
RDW: 13 % (ref 11.0–15.0)
Total Lymphocyte: 51.5 %
WBC: 5.1 10*3/uL (ref 3.8–10.8)

## 2020-02-06 LAB — LIPID PANEL
Cholesterol: 207 mg/dL — ABNORMAL HIGH (ref <200)
HDL: 59 mg/dL (ref >=50)
LDL Cholesterol (Calc): 128 mg/dL (calc) — ABNORMAL HIGH
Non-HDL Cholesterol (Calc): 148 mg/dL (calc) — ABNORMAL HIGH (ref <130)
Total CHOL/HDL Ratio: 3.5 (calc) (ref <5.0)
Triglycerides: 94 mg/dL (ref <150)

## 2020-02-06 LAB — TSH: TSH: 6.2 mIU/L — ABNORMAL HIGH

## 2020-03-04 ENCOUNTER — Encounter: Payer: Self-pay | Admitting: Adult Health

## 2020-03-04 ENCOUNTER — Telehealth (INDEPENDENT_AMBULATORY_CARE_PROVIDER_SITE_OTHER): Payer: 59 | Admitting: Adult Health

## 2020-03-04 DIAGNOSIS — F321 Major depressive disorder, single episode, moderate: Secondary | ICD-10-CM | POA: Diagnosis not present

## 2020-03-04 MED ORDER — TRAZODONE HCL 150 MG PO TABS: 150.0000 mg | ORAL_TABLET | Freq: Every day | ORAL | 0 refills | Status: DC

## 2020-03-04 NOTE — Progress Notes (Signed)
Virtual Visit via Video Note  I connected with Danielle [name]  on 03/04/20 at  4:30 PM EDT by a video enabled telemedicine application and verified that I am speaking with the correct person using two identifiers.  Location patient: home Location provider:work or home office Persons participating in the virtual visit: patient, provider  I discussed the limitations of evaluation and management by telemedicine and the availability of in person appointments. The patient expressed understanding and agreed to proceed.   HPI: 44 year old female who  has a past medical history of Asthma, HPV in female, Migraine, Polycystic ovary syndrome, and Thyroid disease.  She is being seen today for situational depression.  She reports that since the last time I saw her, approximately 1 month ago she has had a lot happen in her life especially over the last 2 weeks.  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  Over the weekend she had 2 friends die unexpectedly.  One friend was found dead in her bed and her other friend who she also had a romantic interest in was killed in a motorcycle accident.  She is going through a lot of stress at her current job as she tries to juggle work, school, her internship, and now the deaths of her friends.  He is currently taking Wellbutrin 150 mg twice daily for smoking cessation as well as tramadol 100 mg nightly for insomnia.   ROS: See pertinent positives and negatives per HPI.  Past Medical History:  Diagnosis Date  . Asthma   . HPV in female   . Migraine   . Polycystic ovary syndrome   . Thyroid disease     Past Surgical History:  Procedure Laterality Date  . cyro      Family History  Problem Relation Age of Onset  . Hyperlipidemia Mother   . Hypertension Mother   .  Diabetes Mother   . Hypertension Sister   . Cancer Father        lung tumor/ lung removed  . Breast cancer Paternal Aunt   . Breast cancer Paternal Grandmother        Current Outpatient Medications:  .  levothyroxine (SYNTHROID) 125 MCG tablet, Take 1 tablet by mouth  daily, Disp: 90 tablet, Rfl: 3 .  norethindrone (ORTHO MICRONOR) 0.35 MG tablet, Take 1 tablet (0.35 mg total) by mouth daily., Disp: 1 Package, Rfl: 5 .  traZODone (DESYREL) 150 MG tablet, Take 1 tablet (150 mg total) by mouth at bedtime., Disp: 90 tablet, Rfl: 0  EXAM:  VITALS per patient if applicable:  GENERAL: alert, oriented, appears well and in no acute distress  HEENT: atraumatic, conjunttiva clear, no obvious abnormalities on inspection of external nose and ears  NECK: normal movements of the head and neck  LUNGS: on inspection no signs of respiratory distress, breathing rate appears normal, no obvious gross SOB, gasping or wheezing  CV: no obvious cyanosis  MS: moves all visible extremities without noticeable abnormality  PSYCH/NEURO: pleasant and cooperative, no obvious depression or anxiety, speech and thought processing grossly intact  ASSESSMENT AND PLAN:  Discussed the following assessment and plan:  1. Depression, major, single episode, moderate (HCC) -I am going to increase trazodone to 150 mg nightly to see if we can get some better control of  depression.  I am also going to write her a letter to be out of work for 2 weeks so that she can focus on school and to mourn the death of her friends.  She will follow-up in 2 weeks via video visit to see how she is doing.  Of course if she needs me she knows that she can contact me sooner.  If she has any suicidal light deviation then she needs to go to the emergency room right away - traZODone (DESYREL) 150 MG tablet; Take 1 tablet (150 mg total) by mouth at bedtime.  Dispense: 90 tablet; Refill: 0      I discussed the assessment and treatment plan  with the patient. The patient was provided an opportunity to ask questions and all were answered. The patient agreed with the plan and demonstrated an understanding of the instructions.   The patient was advised to call back or seek an in-person evaluation if the symptoms worsen or if the condition fails to improve as anticipated.   Shirline Frees, NP   Time spent on chart review, time with patient; discussion of depression  depression treatment, follow up plan, and documentation 40 minutes

## 2020-03-05 ENCOUNTER — Telehealth: Payer: Self-pay | Admitting: Adult Health

## 2020-03-05 NOTE — Telephone Encounter (Signed)
Pt called to say she forgot to tell Kandee Keen that the past month in half periodically her vision has been distorted. Black and yellow spots. Half up she cant see or see the bottom and not the top. She thinks it may be stress related but wanted to inform him.  Please call pt with any follow up advise  [phone]

## 2020-03-11 NOTE — Telephone Encounter (Signed)
I would advise her to see an eye doctor for exam

## 2020-03-12 NOTE — Telephone Encounter (Signed)
Patient is aware 

## 2020-04-09 ENCOUNTER — Ambulatory Visit: Payer: 59 | Admitting: Adult Health

## 2020-04-09 ENCOUNTER — Encounter: Payer: Self-pay | Admitting: Internal Medicine

## 2020-04-10 ENCOUNTER — Encounter: Payer: Self-pay | Admitting: Internal Medicine

## 2020-04-10 ENCOUNTER — Other Ambulatory Visit: Payer: Self-pay

## 2020-04-10 ENCOUNTER — Ambulatory Visit: Payer: 59 | Admitting: Internal Medicine

## 2020-04-10 VITALS — BP 120/82 | HR 77 | Ht 69.0 in | Wt 183.8 lb

## 2020-04-10 DIAGNOSIS — R7303 Prediabetes: Secondary | ICD-10-CM

## 2020-04-10 DIAGNOSIS — E890 Postprocedural hypothyroidism: Secondary | ICD-10-CM

## 2020-04-10 DIAGNOSIS — Z8639 Personal history of other endocrine, nutritional and metabolic disease: Secondary | ICD-10-CM | POA: Diagnosis not present

## 2020-04-10 DIAGNOSIS — E282 Polycystic ovarian syndrome: Secondary | ICD-10-CM | POA: Diagnosis not present

## 2020-04-10 LAB — TSH: TSH: 6.06 u[IU]/mL — ABNORMAL HIGH (ref 0.35–4.50)

## 2020-04-10 LAB — T4, FREE: Free T4: 0.88 ng/dL (ref 0.60–1.60)

## 2020-04-10 LAB — HEMOGLOBIN A1C: Hgb A1c MFr Bld: 6.2 % (ref 4.6–6.5)

## 2020-04-10 NOTE — Progress Notes (Signed)
Patient ID: Danielle [name], female   DOB: December 29, 1975, 44 y.o.   MRN: [id number]  This visit occurred during the SARS-CoV-2 public health emergency.  Safety protocols were in place, including screening questions prior to the visit, additional usage of staff PPE, and extensive cleaning of exam room while observing appropriate contact time as indicated for disinfecting solutions.   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 Last visit 5 months ago.  She had Covid 09/2019.  She recovered well afterwards.   She is very busy trying to continue school and also having a full-time job.  Hypothyroidism: She has a history of medication noncompliance with levothyroxine. In 04/2018, she was missing levothyroxine doses and her TSH was high (at that time she had increased stress as she separated from her husband).  She improved compliance afterwards but at last visit she still missed some doses.  Pt is on levothyroxine 125 mcg daily, taken: - missed 4 days after she ran out... - in am - fasting - at least 30 min from b'fast - no Ca, Fe,PPIs, + occasional MVI at lunchtime - not on Biotin  Reviewed her TFTs: Lab Results  Component Value Date   TSH 6.20 (H) 02/05/2020   TSH 2.89 11/12/2019   TSH 2.26 05/17/2019   TSH 15.43 (H) 05/16/2018   TSH 2.70 05/13/2017   TSH 3.99 11/11/2016   TSH 4.67 (H) 07/13/2016   TSH 1.40 03/05/2016   TSH 8.27 (H) 11/10/2015   TSH 21.68 (H) 08/19/2015   FREET4 1.18 11/12/2019   FREET4 1.04 05/17/2019   FREET4 0.68 05/16/2018   FREET4 1.07 05/13/2017   FREET4 0.87 11/11/2016   FREET4 0.92 07/13/2016   FREET4 1.34 03/05/2016   FREET4 0.80 11/10/2015   FREET4 0.54 (L) 08/19/2015   FREET4 0.18 (L) 08/15/2012  05/06/2014: (Dr. Dareen Piano, ObGyn): TSH 12.58  At last check, TSI's were undetectable: Lab Results  Component Value Date   TSI <89  05/16/2018    Pt denies: - feeling nodules in neck - hoarseness - dysphagia - choking - SOB with lying down  She has + FH of thyroid disorders in: mother. No FH of thyroid cancer. No h/o radiation tx to head or neck.  No seaweed or kelp. No recent contrast studies. No herbal supplements. No Biotin use. No recent steroids use.   PCOS: -She had heavy bleeding, after which we started OCPs in 2017, but continued to have increased bleeding so she was switched to NuvaRing, however, bleeding continued so she stopped it.  5 months before last visit, she started to participate in the research trial with a form of oral contraceptive.  Her bleeding improved.  The study finished in 08/2019.  ObGyn: Dr. Leone Payor.  Prediabetes:  Reviewed HbA1c levels:: Lab Results  Component Value Date   HGBA1C 6.2 (A) 11/12/2019   HGBA1C 6.4 05/17/2019   HGBA1C 6.2 05/16/2018   HGBA1C 5.9 08/15/2012   Prior insurance did not cover nutrition visits.  No CKD: Lab Results  Component Value Date   BUN 10 02/05/2020   Lab Results  Component Value Date   CREATININE 0.81 02/05/2020   + HL: Lab Results  Component Value Date   CHOL 207 (H) 02/05/2020   HDL 59 02/05/2020   LDLCALC 128 (H) 02/05/2020   TRIG 94 02/05/2020   CHOLHDL 3.5 02/05/2020  She is not on a statin.  ROS: Constitutional: no weight gain/no weight loss, no fatigue, no subjective hyperthermia, no subjective hypothermia Eyes: + blurry vision, no xerophthalmia ENT: no sore throat, + see HPI Cardiovascular: no CP/no SOB/no palpitations/no leg swelling Respiratory: no cough/no SOB/no wheezing Gastrointestinal: no N/no V/no D/no C/no acid reflux Musculoskeletal: no muscle aches/no joint aches Skin: no rashes, no hair loss Neurological: no tremors/no numbness/no tingling/no dizziness  I reviewed pt's medications, allergies, PMH, social hx, family hx, and changes were documented in the history of present illness. Otherwise,  unchanged from my initial visit note.  Past Medical History:  Diagnosis Date  . Asthma   . HPV in female   . Migraine   . Polycystic ovary syndrome   . Thyroid disease    Past Surgical History:  Procedure Laterality Date  . cyro     History   Social History  . Marital Status: Married    Spouse Name: N/A    Number of Children: 0   Occupational History  .    Social History Main Topics  . Smoking status: Former Games developer  . Smokeless tobacco: Not on file  . Alcohol Use: Yes  . Drug Use: No  . Sexual Activity: Yes    Birth Control/ Protection: Pill   Current Outpatient Medications on File Prior to Visit  Medication Sig Dispense Refill  . levothyroxine (SYNTHROID) 125 MCG tablet Take 1 tablet by mouth  daily 90 tablet 3  . norethindrone (ORTHO MICRONOR) 0.35 MG tablet Take 1 tablet (0.35 mg total) by mouth daily. 1 Package 5  . traZODone (DESYREL) 150 MG tablet Take 1 tablet (150 mg total) by mouth at bedtime. 90 tablet 0   No current facility-administered medications on file prior to visit.   Allergies  Allergen Reactions  . Allegra [Fexofenadine Hcl]     Swelling occurs  . Sulfa Antibiotics   . Aspirin Rash    Baby asprin; as a child, rash from dyes    Family History  Problem Relation Age of Onset  . Hyperlipidemia Mother   . Hypertension Mother   . Diabetes Mother   . Hypertension Sister   . Cancer Father        lung tumor/ lung removed  . Breast cancer Paternal Aunt   . Breast cancer Paternal Grandmother    PE: BP 120/82   Pulse 77   Ht 5\' 9"  (1.753 m)   Wt 183 lb 12.8 oz (83.4 kg)   SpO2 95%   BMI 27.14 kg/m  Wt Readings from Last 3 Encounters:  04/10/20 183 lb 12.8 oz (83.4 kg)  02/05/20 183 lb (83 kg)  12/04/19 182 lb (82.6 kg)   Constitutional: normal weight, in NAD Eyes: PERRLA, EOMI, no exophthalmos ENT: moist mucous membranes, no thyromegaly, no cervical lymphadenopathy Cardiovascular: RRR, No MRG Respiratory: CTA B Gastrointestinal:  abdomen soft, NT, ND, BS+ Musculoskeletal: no deformities, strength intact in all 4 Skin: moist, warm, no rashes Neurological: no tremor with outstretched hands, DTR normal in all 4  ASSESSMENT: 1. Postablative Hypothyroidism - after RAI tx for Graves ds.  2.  History of Graves' disease  3. PCOS  4.  Prediabetes  PLAN:  1. Patient with longstanding hypothyroidism,  previously noncompliant with levothyroxine, now with better compliance. - latest thyroid labs reviewed with pt >> TSH was elevated: Lab Results  Component Value Date   TSH 6.20 (H) 02/05/2020   - she is now on 125 mcg daily  - pt feels good on this dose. - we  discussed about taking the thyroid hormone every day, with water, >30 minutes before breakfast, separated by >4 hours from acid reflux medications, calcium, iron, multivitamins. Pt. is taking it correctly, but again missed few doses of LT4 as she ran out. - will check thyroid tests today: TSH and fT4 - If labs are abnormal, she will need to return for repeat TFTs in 1.5 months  2.  History of Graves' disease -Resolved after RAI treatment -She denies neck compression symptoms -No signs of Graves' ophthalmopathy: + blurry vision, double vision, eye pain, chemosis -Latest TSI's were undetectable.  We will not repeat them today  3. PCOS -S she has a history of heavy menstrual cycles and is on an oral contraceptive.  We restarted her OCPs but bleeding was worse.  She saw OB/GYN and started on NuvaRing, but she came off.  At that time, she started to participate in a research trial with an OCP and she had improved DUB.  The study finished before our last visit.  At last visit, she needed a refill on her OCPs, however, due to her previous history of increased bleeding, I recommended that she contact the OB/GYN for this.  4.  Prediabetes -Her HbA1c levels are in the prediabetes range -I tried to refer her to nutrition but she found out that these appointments are not  covered by her insurance -However, before last visit, she started to change her diet. -HbA1c at last visit was 6.2%, improved -will check HbA1c again today  Component     Latest Ref Rng & Units 04/10/2020  TSH     0.35 - 4.50 uIU/mL 6.06 (H)  T4,Free(Direct)     0.60 - 1.60 ng/dL 0.63  Hemoglobin K1S     4.6 - 6.5 % 6.2  HbA1c stable.  TSH is still high, most likely due to missed thyroid doses. I will advise her to take the medication consistently and will repeat the tests in 1.5 months.  Carlus Pavlov, MD PhD [facility]

## 2020-04-10 NOTE — Patient Instructions (Signed)
Please stop at the lab.  Please continue Levothyroxine 125 mcg daily.  Take the thyroid hormone every day, with water, at least 30 minutes before breakfast, separated by at least 4 hours from: - acid reflux medications - calcium - iron - multivitamins  Please come back for a follow-up appointment in 6 months.  

## 2020-04-23 LAB — HM DIABETES EYE EXAM

## 2020-04-28 ENCOUNTER — Encounter: Payer: Self-pay | Admitting: Internal Medicine

## 2020-05-16 ENCOUNTER — Ambulatory Visit: Payer: 59 | Admitting: Internal Medicine

## 2020-05-20 ENCOUNTER — Encounter: Payer: Self-pay | Admitting: Adult Health

## 2020-05-30 ENCOUNTER — Encounter: Payer: Self-pay | Admitting: Family Medicine

## 2020-05-30 ENCOUNTER — Telehealth: Payer: Self-pay | Admitting: Adult Health

## 2020-05-30 NOTE — Telephone Encounter (Signed)
See prior note

## 2020-05-30 NOTE — Telephone Encounter (Signed)
Pt call and stated she want Fleet Contras to give her a call at [phone].

## 2020-05-30 NOTE — Telephone Encounter (Signed)
Patient has started a new job and currently does not have insurance.   I am experiencing symptoms of a BV and need a prescription sent in on my behalf please. If you have any questions, please don't hesitate to have a nurse contact me. Thank you!  Please advise. Patient is aware that Kandee Keen is away from the office.

## 2020-06-03 ENCOUNTER — Other Ambulatory Visit: Payer: Self-pay | Admitting: Adult Health

## 2020-06-03 MED ORDER — METRONIDAZOLE 500 MG PO TABS: 500.0000 mg | ORAL_TABLET | Freq: Two times a day (BID) | ORAL | 0 refills | Status: AC

## 2020-06-03 NOTE — Telephone Encounter (Signed)
Spoke with patient.  "yes please".

## 2020-06-03 NOTE — Telephone Encounter (Signed)
Does she still need something?

## 2020-06-22 ENCOUNTER — Other Ambulatory Visit: Payer: Self-pay | Admitting: Adult Health

## 2020-06-22 ENCOUNTER — Other Ambulatory Visit: Payer: Self-pay | Admitting: Internal Medicine

## 2020-06-22 DIAGNOSIS — Z30011 Encounter for initial prescription of contraceptive pills: Secondary | ICD-10-CM

## 2020-07-10 ENCOUNTER — Ambulatory Visit: Payer: No Typology Code available for payment source | Admitting: Adult Health

## 2020-07-10 ENCOUNTER — Encounter: Payer: Self-pay | Admitting: Adult Health

## 2020-07-10 ENCOUNTER — Other Ambulatory Visit: Payer: Self-pay

## 2020-07-10 VITALS — BP 104/72 | Temp 98.9°F | Wt 188.0 lb

## 2020-07-10 DIAGNOSIS — N898 Other specified noninflammatory disorders of vagina: Secondary | ICD-10-CM | POA: Diagnosis not present

## 2020-07-10 NOTE — Progress Notes (Signed)
Subjective:    Patient ID: Danielle [name], female    DOB: 01/15/1976, 45 y.o.   MRN: [id number]  HPI 45 year old female who  has a past medical history of Asthma, HPV in female, Migraine, Polycystic ovary syndrome, and Thyroid disease.  She presents to the office today for vaginal odor.  She does have a history of recurrent bacterial vaginosis.  She was last treated with Flagyl for 7 days on 05/30/2020.  She feels as though her symptoms have returned.  She was advised in the past to follow-up with GYN due to the recurrent nature but has failed to do so.  Today she reports that shortly after she finished the antibiotics she had a " different odor" that she cannot exactly explain. This odor is intermittent and is not present daily. She denies vaginal discharge or new sexual partners.      Review of Systems See HPI   Past Medical History:  Diagnosis Date  . Asthma   . HPV in female   . Migraine   . Polycystic ovary syndrome   . Thyroid disease     Social History   Socioeconomic History  . Marital status: Single    Spouse name: Not on file  . Number of children: Not on file  . Years of education: Not on file  . Highest education level: Not on file  Occupational History  . Not on file  Tobacco Use  . Smoking status: Current Some Day Smoker    Last attempt to quit: 06/24/2014    Years since quitting: 6.0  . Smokeless tobacco: Never Used  Substance and Sexual Activity  . Alcohol use: Yes    Alcohol/week: 0.0 standard drinks    Comment: ocassional  . Drug use: No  . Sexual activity: Yes    Birth control/protection: Pill  Other Topics Concern  . Not on file  Social History Narrative  . Not on file   Social Determinants of Health   Financial Resource Strain: Not on file  Food Insecurity: Not on file  Transportation Needs: Not on file  Physical Activity: Not on file  Stress: Not on file  Social Connections: Not on file  Intimate Partner Violence: Not on file     Past Surgical History:  Procedure Laterality Date  . cyro      Family History  Problem Relation Age of Onset  . Hyperlipidemia Mother   . Hypertension Mother   . Diabetes Mother   . Hypertension Sister   . Cancer Father        lung tumor/ lung removed  . Breast cancer Paternal Aunt   . Breast cancer Paternal Grandmother     Allergies  Allergen Reactions  . Allegra [Fexofenadine Hcl]     Swelling occurs  . Sulfa Antibiotics   . Aspirin Rash    Baby asprin; as a child, rash from dyes     Current Outpatient Medications on File Prior to Visit  Medication Sig Dispense Refill  . EUTHYROX 125 MCG tablet Take 1 tablet by mouth once daily 30 tablet 3  . norethindrone (MICRONOR) 0.35 MG tablet Take 1 tablet by mouth once daily 84 tablet 0  . traZODone (DESYREL) 150 MG tablet Take 1 tablet (150 mg total) by mouth at bedtime. 90 tablet 0   No current facility-administered medications on file prior to visit.    There were no vitals taken for this visit.      Objective:   Physical Exam  Vitals and nursing note reviewed.  Constitutional:      Appearance: Normal appearance.  Cardiovascular:     Rate and Rhythm: Normal rate and regular rhythm.     Pulses: Normal pulses.     Heart sounds: Normal heart sounds.  Pulmonary:     Effort: Pulmonary effort is normal.     Breath sounds: Normal breath sounds.  Musculoskeletal:        General: Normal range of motion.  Skin:    General: Skin is warm and dry.     Capillary Refill: Capillary refill takes less than 2 seconds.  Neurological:     General: No focal deficit present.     Mental Status: She is alert and oriented to person, place, and time.  Psychiatric:        Mood and Affect: Mood normal.        Behavior: Behavior normal.        Thought Content: Thought content normal.        Judgment: Judgment normal.       Assessment & Plan:  1. Vaginal odor - asymptomatic today  - Will refer to GYN for further management d/t  recurrent issues with BV.  - Will do PAP there  Shirline Frees, NP

## 2020-08-05 ENCOUNTER — Other Ambulatory Visit: Payer: Self-pay | Admitting: Adult Health

## 2020-08-05 DIAGNOSIS — F321 Major depressive disorder, single episode, moderate: Secondary | ICD-10-CM

## 2020-08-06 NOTE — Telephone Encounter (Signed)
Sent to the pharmacy by e-scribe. 

## 2020-08-15 ENCOUNTER — Other Ambulatory Visit: Payer: Self-pay

## 2020-08-15 ENCOUNTER — Ambulatory Visit: Payer: No Typology Code available for payment source | Admitting: Family Medicine

## 2020-08-15 ENCOUNTER — Other Ambulatory Visit: Payer: No Typology Code available for payment source

## 2020-08-15 ENCOUNTER — Ambulatory Visit (INDEPENDENT_AMBULATORY_CARE_PROVIDER_SITE_OTHER): Payer: No Typology Code available for payment source

## 2020-08-15 VITALS — BP 108/70 | HR 75 | Temp 97.9°F | Resp 20 | Ht 69.0 in | Wt 191.4 lb

## 2020-08-15 DIAGNOSIS — M79644 Pain in right finger(s): Secondary | ICD-10-CM | POA: Diagnosis not present

## 2020-08-15 DIAGNOSIS — M25521 Pain in right elbow: Secondary | ICD-10-CM

## 2020-08-15 NOTE — Progress Notes (Signed)
Established Patient Office Visit  Subjective:  Patient ID: Danielle [name], female    DOB: 28-Jul-1975  Age: 45 y.o. MRN: [id number]  CC:  Chief Complaint  Patient presents with  . Fall    Onset: 08/15/2020 .. ankle , back , neck and thumb pain    HPI Danielle [name] presents for injuries following fall at work around 3:30 AM today. 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 She got up around 3:30 AM to go the bathroom and states this floor was slick and she fell forward. There was a nursing station there and she thinks she may have actually fallen against the nursing station. There was some question of loss of consciousness but no definite head injury.   She is aware predominantly now of right elbow pain and right thumb pain. She noticed little bit of bruising and she had some of the polish knocked off of her right thumb. She has some pain with bending the distal aspect of the right thumb. She has full range of motion right elbow but has some pain over the olecranon process of the right elbow.  Past Medical History:  Diagnosis Date  . Asthma   . HPV in female   . Migraine   . Polycystic ovary syndrome   . Thyroid disease     Past Surgical History:  Procedure Laterality Date  . cyro      Family History  Problem Relation Age of Onset  . Hyperlipidemia Mother   . Hypertension Mother   . Diabetes Mother   . Hypertension Sister   . Cancer Father        lung tumor/ lung removed  . Breast cancer Paternal Aunt   . Breast cancer Paternal Grandmother     Social History   Socioeconomic History  . Marital status: Single    Spouse name: Not on file  . Number of children: Not on file  . Years of education: Not on file  . Highest education level: Not on file  Occupational History  . Not on file  Tobacco Use  . Smoking status: Current Some Day Smoker    Last attempt to quit: 06/24/2014    Years since quitting: 6.1  . Smokeless tobacco: Never  Used  Substance and Sexual Activity  . Alcohol use: Yes    Alcohol/week: 0.0 standard drinks    Comment: ocassional  . Drug use: No  . Sexual activity: Yes    Birth control/protection: Pill  Other Topics Concern  . Not on file  Social History Narrative  . Not on file   Social Determinants of Health   Financial Resource Strain: Not on file  Food Insecurity: Not on file  Transportation Needs: Not on file  Physical Activity: Not on file  Stress: Not on file  Social Connections: Not on file  Intimate Partner Violence: Not on file    Outpatient Medications Prior to Visit  Medication Sig Dispense Refill  . EUTHYROX 125 MCG tablet Take 1 tablet by mouth once daily 30 tablet 3  . norethindrone (MICRONOR) 0.35 MG tablet Take 1 tablet by mouth once daily 84 tablet 0  . traZODone (DESYREL) 150 MG tablet TAKE 1 TABLET BY MOUTH AT BEDTIME 90 tablet 1   No facility-administered medications prior to visit.    Allergies  Allergen Reactions  . Allegra [Fexofenadine Hcl]     Swelling occurs  . Sulfa Antibiotics   . Aspirin Rash  Baby asprin; as a child, rash from dyes     ROS Review of Systems  Gastrointestinal: Negative for nausea and vomiting.  Neurological: Negative for seizures, speech difficulty, weakness and headaches.  Psychiatric/Behavioral: Negative for confusion.      Objective:    Physical Exam Vitals reviewed.  Constitutional:      Appearance: Normal appearance.  HENT:     Head: Normocephalic and atraumatic.  Cardiovascular:     Rate and Rhythm: Normal rate and regular rhythm.  Musculoskeletal:     Comments: Right elbow- full ROM.   No olecranon edema.  Slightly tender over olecranon process.   No visible bruising.  No radial head tenderness.   Right thumb- mildly tender over IP joint.  Proximal nail polish is scraped off but no obvious nail trauma.  Full ROM.  Neurological:     Mental Status: She is alert.     BP 108/70   Pulse 75   Temp 97.9 F  (36.6 C) (Oral)   Resp 20   Ht 5\' 9"  (1.753 m)   Wt 191 lb 6.4 oz (86.8 kg)   SpO2 99%   BMI 28.26 kg/m  Wt Readings from Last 3 Encounters:  08/15/20 191 lb 6.4 oz (86.8 kg)  07/10/20 188 lb (85.3 kg)  04/10/20 183 lb 12.8 oz (83.4 kg)     Health Maintenance Due  Topic Date Due  . Hepatitis C Screening  Never done  . COVID-19 Vaccine (1) Never done  . TETANUS/TDAP  Never done  . PAP SMEAR-Modifier  07/29/2020    There are no preventive care reminders to display for this patient.  Lab Results  Component Value Date   TSH 6.06 (H) 04/10/2020   Lab Results  Component Value Date   WBC 5.1 02/05/2020   HGB 10.7 (L) 02/05/2020   HCT 34.2 (L) 02/05/2020   MCV 87.0 02/05/2020   PLT 296 02/05/2020   Lab Results  Component Value Date   NA 137 02/05/2020   K 3.5 02/05/2020   CO2 20 02/05/2020   GLUCOSE 92 02/05/2020   BUN 10 02/05/2020   CREATININE 0.81 02/05/2020   BILITOT 0.4 02/05/2020   ALKPHOS 47 11/12/2019   AST 12 02/05/2020   ALT 13 02/05/2020   PROT 7.0 02/05/2020   ALBUMIN 3.9 11/12/2019   CALCIUM 8.9 02/05/2020   GFR 106.86 11/12/2019   Lab Results  Component Value Date   CHOL 207 (H) 02/05/2020   Lab Results  Component Value Date   HDL 59 02/05/2020   Lab Results  Component Value Date   LDLCALC 128 (H) 02/05/2020   Lab Results  Component Value Date   TRIG 94 02/05/2020   Lab Results  Component Value Date   CHOLHDL 3.5 02/05/2020   Lab Results  Component Value Date   HGBA1C 6.2 04/10/2020      Assessment & Plan:   Problem List Items Addressed This Visit   None   Visit Diagnoses    Pain of right thumb    -  Primary   Relevant Orders   DG Finger Thumb Right   Right elbow pain       Relevant Orders   DG Elbow Complete Right    Obtain X-rays as above.   If negative, continue with icing 15-20 minutes 3-4 times daily for next couple of days.   No orders of the defined types were placed in this encounter.   Follow-up: No  follow-ups on file.  Carolann Littler, MD

## 2020-08-18 NOTE — Progress Notes (Signed)
Mychart message sent: Right elbow and right thumb show no fracture.  Suspect bone contusion.   Still may have some bone soreness for several weeks.

## 2020-08-27 ENCOUNTER — Ambulatory Visit: Payer: No Typology Code available for payment source | Admitting: Orthopaedic Surgery

## 2020-09-02 ENCOUNTER — Encounter: Payer: Self-pay | Admitting: Orthopaedic Surgery

## 2020-09-02 ENCOUNTER — Other Ambulatory Visit: Payer: Self-pay

## 2020-09-02 ENCOUNTER — Ambulatory Visit (INDEPENDENT_AMBULATORY_CARE_PROVIDER_SITE_OTHER): Payer: PRIVATE HEALTH INSURANCE | Admitting: Orthopaedic Surgery

## 2020-09-02 DIAGNOSIS — M79644 Pain in right finger(s): Secondary | ICD-10-CM

## 2020-09-02 MED ORDER — NAPROXEN 500 MG PO TABS: 500.0000 mg | ORAL_TABLET | Freq: Two times a day (BID) | ORAL | 3 refills | Status: DC
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
## 2020-09-08 ENCOUNTER — Other Ambulatory Visit: Payer: Self-pay

## 2020-09-08 ENCOUNTER — Ambulatory Visit (INDEPENDENT_AMBULATORY_CARE_PROVIDER_SITE_OTHER): Payer: No Typology Code available for payment source | Admitting: Internal Medicine

## 2020-09-08 ENCOUNTER — Encounter: Payer: Self-pay | Admitting: Internal Medicine

## 2020-09-08 VITALS — BP 120/78 | HR 97 | Ht 69.0 in | Wt 192.2 lb

## 2020-09-08 DIAGNOSIS — Z8639 Personal history of other endocrine, nutritional and metabolic disease: Secondary | ICD-10-CM | POA: Diagnosis not present

## 2020-09-08 DIAGNOSIS — R7303 Prediabetes: Secondary | ICD-10-CM | POA: Diagnosis not present

## 2020-09-08 DIAGNOSIS — E282 Polycystic ovarian syndrome: Secondary | ICD-10-CM | POA: Diagnosis not present

## 2020-09-08 DIAGNOSIS — E890 Postprocedural hypothyroidism: Secondary | ICD-10-CM

## 2020-09-08 LAB — POCT GLYCOSYLATED HEMOGLOBIN (HGB A1C): Hemoglobin A1C: 5.9 % — AB (ref 4.0–5.6)

## 2020-09-08 MED ORDER — SPIRONOLACTONE 50 MG PO TABS: 50.0000 mg | ORAL_TABLET | Freq: Two times a day (BID) | ORAL | 3 refills | Status: DC

## 2020-09-08 NOTE — Progress Notes (Signed)
Patient ID: Danielle [name], female   DOB: May 11, 1976, 45 y.o.   MRN: [id number]  This visit occurred during the SARS-CoV-2 public health emergency.  Safety protocols were in place, including screening questions prior to the visit, additional usage of staff PPE, and extensive cleaning of exam room while observing appropriate contact time as indicated for disinfecting solutions.   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 Last visit 5 months ago.  Interim history: At last visit she was very busy trying to continue school and also having a full-time job. She had an episode of syncope, last 04/2020, preceded by changes in vision ("spotty vision"), and heat sensation. Prev.  presyncope in 08/2019. No HAs.  She saw optometry-no abnormalities. Eats more rice crispies and pop tarts. Gained weight.  Hypothyroidism: She has a history of medication noncompliance with levothyroxine. In 04/2018, she was missing levothyroxine doses and her TSH was high (at that time she had increased stress as she separated from her husband).  She improved compliance afterwards but continues to miss doses.  Pt is on levothyroxine 125 mcg daily, taken: -still misses doses! - in am - fasting - at least 30 min from b'fast - no Ca, Fe, PPIs, + occasional multivitamins in am - not on Biotin  Reviewed her TFTs: Lab Results  Component Value Date   TSH 6.06 (H) 04/10/2020   TSH 6.20 (H) 02/05/2020   TSH 2.89 11/12/2019   TSH 2.26 05/17/2019   TSH 15.43 (H) 05/16/2018   TSH 2.70 05/13/2017   TSH 3.99 11/11/2016   TSH 4.67 (H) 07/13/2016   TSH 1.40 03/05/2016   TSH 8.27 (H) 11/10/2015   FREET4 0.88 04/10/2020   FREET4 1.18 11/12/2019   FREET4 1.04 05/17/2019   FREET4 0.68 05/16/2018   FREET4 1.07 05/13/2017   FREET4 0.87 11/11/2016   FREET4 0.92 07/13/2016   FREET4 1.34 03/05/2016   FREET4  0.80 11/10/2015   FREET4 0.54 (L) 08/19/2015  05/06/2014: (Dr. Dareen Piano, ObGyn): TSH 12.58  At last check, TSI's were undetectable: Lab Results  Component Value Date   TSI <89 05/16/2018    Pt denies: - feeling nodules in neck - hoarseness - dysphagia - choking - SOB with lying down  She has + FH of thyroid disorders in: mother. No FH of thyroid cancer. No h/o radiation tx to head or neck.  No seaweed or kelp. No recent contrast studies. No herbal supplements. No Biotin use. No recent steroids use.   PCOS: -She had heavy bleeding, after which we started OCPs in 2017, but continued to have increased bleeding so she was switched to NuvaRing, however, bleeding continued so she stopped it.  She was then in a research trial with a form of oral contraceptive.  Her bleeding improved.  The study finished in 08/2019.  -At last visit, she wanted to switch back to oral contraceptives.  Due to previous history of increased vaginal bleeding on these, I directed her back to OB/GYN >> she restarted OCPs approximately 2 months ago.  ObGyn: Dr. Leone Payor.  Prediabetes:  Reviewed HbA1c levels:: Lab Results  Component Value Date   HGBA1C 6.2 04/10/2020   HGBA1C 6.2 (A) 11/12/2019   HGBA1C 6.4 05/17/2019   HGBA1C 6.2 05/16/2018   HGBA1C 5.9 08/15/2012   Prior insurance did not cover nutrition visits.  No CKD: Lab Results  Component Value Date   BUN  10 02/05/2020   Lab Results  Component Value Date   CREATININE 0.81 02/05/2020   + HL: Lab Results  Component Value Date   CHOL 207 (H) 02/05/2020   HDL 59 02/05/2020   LDLCALC 128 (H) 02/05/2020   TRIG 94 02/05/2020   CHOLHDL 3.5 02/05/2020  She is not on a statin.  Lab Results  Component Value Date   CREATININE 0.81 02/05/2020   BUN 10 02/05/2020   NA 137 02/05/2020   K 3.5 02/05/2020   CL 105 02/05/2020   CO2 20 02/05/2020   ROS: Constitutional: + weight gain/no weight loss, no fatigue, no subjective hyperthermia, no  subjective hypothermia Eyes: + Episodes of decreased vision, no xerophthalmia ENT: no sore throat, + see HPI Cardiovascular: no CP/no SOB/no palpitations/no leg swelling Respiratory: no cough/no SOB/no wheezing Gastrointestinal: no N/no V/no D/no C/no acid reflux Musculoskeletal: no muscle aches/no joint aches Skin: no rashes, no hair loss Neurological: no tremors/no numbness/no tingling/no dizziness  I reviewed pt's medications, allergies, PMH, social hx, family hx, and changes were documented in the history of present illness. Otherwise, unchanged from my initial visit note.  Past Medical History:  Diagnosis Date  . Asthma   . HPV in female   . Migraine   . Polycystic ovary syndrome   . Thyroid disease    Past Surgical History:  Procedure Laterality Date  . cyro     History   Social History  . Marital Status: Married    Spouse Name: N/A    Number of Children: 0   Occupational History  .    Social History Main Topics  . Smoking status: Former Games developer  . Smokeless tobacco: Not on file  . Alcohol Use: Yes  . Drug Use: No  . Sexual Activity: Yes    Birth Control/ Protection: Pill   Current Outpatient Medications on File Prior to Visit  Medication Sig Dispense Refill  . EUTHYROX 125 MCG tablet Take 1 tablet by mouth once daily 30 tablet 3  . naproxen (NAPROSYN) 500 MG tablet Take 1 tablet (500 mg total) by mouth 2 (two) times daily with a meal. 30 tablet 3  . norethindrone (MICRONOR) 0.35 MG tablet Take 1 tablet by mouth once daily 84 tablet 0  . traZODone (DESYREL) 150 MG tablet TAKE 1 TABLET BY MOUTH AT BEDTIME 90 tablet 1   No current facility-administered medications on file prior to visit.   Allergies  Allergen Reactions  . Allegra [Fexofenadine Hcl]     Swelling occurs  . Sulfa Antibiotics   . Aspirin Rash    Baby asprin; as a child, rash from dyes    Family History  Problem Relation Age of Onset  . Hyperlipidemia Mother   . Hypertension Mother   .  Diabetes Mother   . Hypertension Sister   . Cancer Father        lung tumor/ lung removed  . Breast cancer Paternal Aunt   . Breast cancer Paternal Grandmother    PE: BP 120/78 (BP Location: Right Arm, Patient Position: Sitting, Cuff Size: Normal)   Pulse 97   Ht 5\' 9"  (1.753 m)   Wt 192 lb 3.2 oz (87.2 kg)   SpO2 99%   BMI 28.38 kg/m  Wt Readings from Last 3 Encounters:  09/08/20 192 lb 3.2 oz (87.2 kg)  08/15/20 191 lb 6.4 oz (86.8 kg)  07/10/20 188 lb (85.3 kg)   Constitutional: overweight, in NAD Eyes: PERRLA, EOMI, no exophthalmos ENT: moist mucous  membranes, no thyromegaly, no cervical lymphadenopathy Cardiovascular: RRR, No MRG Respiratory: CTA B Gastrointestinal: abdomen soft, NT, ND, BS+ Musculoskeletal: no deformities, strength intact in all 4 Skin: moist, warm, no rashes Neurological: no tremor with outstretched hands, DTR normal in all 4  ASSESSMENT: 1. Postablative Hypothyroidism - after RAI tx for Graves ds.  2.  History of Graves' disease  3. PCOS  4.  Prediabetes  PLAN:  1. Patient with longstanding hypothyroidism, with history of incomplete compliance with levothyroxine - latest thyroid labs reviewed with pt >> elevated at last visit, most likely due to missed levothyroxine doses: Lab Results  Component Value Date   TSH 6.06 (H) 04/10/2020   - she continues on LT4 125 mcg daily - pt feels good on this dose. - we discussed about taking the thyroid hormone every day, with water, >30 minutes before breakfast, separated by >4 hours from acid reflux medications, calcium, iron, multivitamins. Pt. is taking it correctly, but again, she missed doses last week... We again discussed about the importance of not missing doses - will check thyroid tests in 1.5 mo: TSH and fT4 - If labs are abnormal, she will need to return for repeat TFTs in 1.5 months  2.  History of Graves' disease -Resolved after RAI treatment -Latest TSI antibodies are undetectable. -No  neck compression symptoms -She denies double vision, eye pain, chemosis.  No indication of Graves' ophthalmopathy. -Current periodic vision changes are unlikely to be related to Graves' disease.  They appeared to be part of aura before syncopal events.  With the last event she lost consciousness completely and lost control of her bladder.  We discussed that she may have had a seizure.  She needs to see neurology >> advised to discuss with PCP about a referral.  3. PCOS -She has a history of heavy menstrual cycles and wason oral contraceptives, then NuvaRing by OB/GYN.  Afterwards, she wanted to switch back to OCPs but due to previous history of increased bleeding, I recommended that she contacted OB/GYN for this. -Now on norethindrone -no SEs.  She does not have headaches, but does have vision changes as mentioned above, which started before starting OCPs, so likely not related -She is interested in restarting spironolactone, which she took in the past but was stopped when she started to participate in a research trial.  She was taking seeing to help with improving hair and weight.  At this visit, we will stop thinking it would start a lower dose spironolactone which we will increase to target dose in 5 days.  After 5 more days, she will return for a kidney function and potassium level.  Also, for a nurse visit for blood pressure.  4.  Prediabetes -HbA1c is in the prediabetic range, stable at last visit, 6.2% -I referred her to nutrition in the past but the appointments are not covered by her insurance -She started to change her diet at the beginning of last year -she gained 9 lbs since last OV as she is eating sweets >> advised her to stop eating pop tarts and rice crispies. -Recheck HbA1c today: 5.9% (better)  Orders Placed This Encounter  Procedures  . TSH  . T4, free  . Basic metabolic panel  . POCT glycosylated hemoglobin (Hb A1C)   Carlus Pavlov, MD PhD [facility] Endocrinology

## 2020-09-08 NOTE — Patient Instructions (Addendum)
Please come back for thyroid labs in 1.5 months.  Please continue: - Levothyroxine 125 mcg daily  Take the thyroid hormone every day, with water, at least 30 minutes before breakfast, separated by at least 4 hours from: - acid reflux medications - calcium - iron - multivitamins  If you restart vitamins >> move them later in the day.  Stop zinc.  Restart Spironolactone 25 mg 2x a day for the next 5 days, then increase to 50 mg 2x a day. After this, in 5 more days, schedule a nurse visit for BP check and a lab appt for potassium.  Please come back for a follow-up appointment in 6 months.

## 2020-09-12 ENCOUNTER — Telehealth: Payer: Self-pay | Admitting: Orthopaedic Surgery

## 2020-09-12 ENCOUNTER — Encounter: Payer: Self-pay | Admitting: Orthopaedic Surgery

## 2020-09-12 NOTE — Telephone Encounter (Signed)
Patient called. She would like a letter to extend being out of work until seen on her next appointment. Her e-mail is [email or web address] or you can put it in her mychart. Her call back number is [phone]. She is scheduled to go in tonight at 1am.

## 2020-09-15 NOTE — Telephone Encounter (Signed)
Okay to do this. 

## 2020-09-15 NOTE — Telephone Encounter (Signed)
Please write note for her to be out of work.  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  Thanks.

## 2020-09-15 NOTE — Telephone Encounter (Signed)
yes

## 2020-09-16 NOTE — Telephone Encounter (Signed)
Note made.  

## 2020-09-17 ENCOUNTER — Ambulatory Visit (INDEPENDENT_AMBULATORY_CARE_PROVIDER_SITE_OTHER): Payer: PRIVATE HEALTH INSURANCE | Admitting: Orthopaedic Surgery

## 2020-09-17 ENCOUNTER — Telehealth: Payer: Self-pay | Admitting: Orthopaedic Surgery

## 2020-09-17 ENCOUNTER — Other Ambulatory Visit: Payer: Self-pay

## 2020-09-17 DIAGNOSIS — M79644 Pain in right finger(s): Secondary | ICD-10-CM | POA: Diagnosis not present

## 2020-09-17 MED ORDER — PREDNISONE 10 MG (21) PO TBPK: ORAL_TABLET | ORAL | 0 refills | Status: DC

## 2020-09-17 NOTE — Progress Notes (Signed)
Office Visit Note   Patient: Danielle [name]           Date of Birth: 1976/05/03           MRN: [id number] Visit Date: 09/17/2020              Requested by: Shirline Frees, NP [address] [place],  [place] [address] PCP: Shirline Frees, NP   Assessment & Plan: Visit Diagnoses:  1. Pain of right thumb     Plan: Impression is right thumb radial collateral ligament sprain.  I think it might benefit her to try prednisone Dosepak to decrease the inflammation.  She will continue with the same work restrictions.  I would like to recheck her in 4 weeks.  Follow-Up Instructions: Return in about 4 weeks (around 10/15/2020).   Orders:  No orders of the defined types were placed in this encounter.  Meds ordered this encounter  Medications  . predniSONE (STERAPRED UNI-PAK 21 TAB) 10 MG (21) TBPK tablet    Sig: Take as directed    Dispense:  21 tablet    Refill:  0      Procedures: No procedures performed   Clinical Data: No additional findings.   Subjective: Chief Complaint  Patient presents with  . Right Thumb - Pain    Tyronda returns today for follow-up of right thumb injury.  Overall she is doing a little bit better.  Feels some soreness and takes naproxen Flexeril.  She did get her work note on Monday.  Date of injury was 08/15/2020.  However she had a couple more injuries after the initial injury.   Review of Systems  Constitutional: Negative.   HENT: Negative.   Eyes: Negative.   Respiratory: Negative.   Cardiovascular: Negative.   Endocrine: Negative.   Musculoskeletal: Negative.   Neurological: Negative.   Hematological: Negative.   Psychiatric/Behavioral: Negative.   All other systems reviewed and are negative.    Objective: Vital Signs: There were no vitals taken for this visit.  Physical Exam Vitals and nursing note reviewed.  Constitutional:      Appearance: She is well-developed.  Pulmonary:     Effort: Pulmonary effort is normal.   Skin:    General: Skin is warm.     Capillary Refill: Capillary refill takes less than 2 seconds.  Neurological:     Mental Status: She is alert and oriented to person, place, and time.  Psychiatric:        Behavior: Behavior normal.        Thought Content: Thought content normal.        Judgment: Judgment normal.     Ortho Exam Right thumb shows mild swelling.  There is a small nailbed hematoma.  Nail is intact.  No neurovascular compromise.  She is tender along the radial collateral ligament.  Collateral ligaments are stable to testing with some reproducible pain to stress of the radial collateral ligament.  Specialty Comments:  No specialty comments available.  Imaging: No results found.   PMFS History: Patient Active Problem List   Diagnosis Date Noted  . Pain of right thumb 09/02/2020  . Viral conjunctivitis of both eyes 07/04/2018  . URI (upper respiratory infection) 07/04/2018  . Insomnia 04/25/2017  . Snoring 04/25/2017  . PCOS (polycystic ovarian syndrome) 11/10/2015  . 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 Right leg pain 10/08/2013  . Depression 12/04/2012  . H/O Graves' disease 05/08/2012   Past Medical History:  Diagnosis Date  . Asthma   .  HPV in female   . Migraine   . Polycystic ovary syndrome   . Thyroid disease     Family History  Problem Relation Age of Onset  . Hyperlipidemia Mother   . Hypertension Mother   . Diabetes Mother   . Hypertension Sister   . Cancer Father        lung tumor/ lung removed  . Breast cancer Paternal Aunt   . Breast cancer Paternal Grandmother     Past Surgical History:  Procedure Laterality Date  . cyro     Social History   Occupational History  . Not on file  Tobacco Use  . Smoking status: Current Some Day Smoker    Last attempt to quit: 06/24/2014    Years since quitting: 6.2  . Smokeless tobacco: Never Used  Substance and Sexual Activity  . Alcohol use: Yes    Alcohol/week: 0.0 standard drinks     Comment: ocassional  . Drug use: No  . Sexual activity: Yes    Birth control/protection: Pill

## 2020-09-17 NOTE — Telephone Encounter (Signed)
Patient called requesting a work note. Patient states she left detailed message on mychart. 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 Also asking for a copy to be sent to her MyChart for her records. Please call patient about this matter at [phone].

## 2020-09-17 NOTE — Telephone Encounter (Signed)
Mychart sent to xu

## 2020-09-17 NOTE — Telephone Encounter (Signed)
Marisue Ivan, do you mind sending an updated work note that states what she put in her message please.  Thank you

## 2020-09-19 ENCOUNTER — Telehealth: Payer: Self-pay | Admitting: Orthopaedic Surgery

## 2020-09-19 NOTE — Telephone Encounter (Signed)
Disability forms received from pts employer. Sent to [employer].

## 2020-09-22 ENCOUNTER — Telehealth: Payer: Self-pay

## 2020-09-22 NOTE — Telephone Encounter (Signed)
Patient called concerning a note/letter being sent to her employer.  Stated that the note needs to have dates stating  how long she will be out of work.    CB# [phone].  Please advise.  Thank you.

## 2020-09-22 NOTE — Telephone Encounter (Signed)
Signing off, for clinic assistant

## 2020-09-23 NOTE — Telephone Encounter (Signed)
I sent mychart msg.

## 2020-09-29 ENCOUNTER — Telehealth: Payer: Self-pay | Admitting: Orthopaedic Surgery

## 2020-09-29 NOTE — Telephone Encounter (Signed)
Received vm from pt checking on forms. IC,lmvm advised need to contact [employer]. Forms received 3/25. [employer] needs $25 form fee and authorization.

## 2020-10-01 ENCOUNTER — Telehealth: Payer: Self-pay | Admitting: Orthopaedic Surgery

## 2020-10-01 NOTE — Telephone Encounter (Signed)
Received $25.00 cash and medical records release form from patient /   Forwarding to [employer] today 

## 2020-10-03 ENCOUNTER — Telehealth: Payer: Self-pay | Admitting: Orthopaedic Surgery

## 2020-10-03 NOTE — Telephone Encounter (Signed)
Patient submitted medical release form, short term disability, and $25.00 cash payment to [employer]. Accepted 10/03/20

## 2020-10-10 ENCOUNTER — Ambulatory Visit: Payer: 59 | Admitting: Internal Medicine

## 2020-10-14 ENCOUNTER — Ambulatory Visit: Payer: No Typology Code available for payment source | Admitting: Orthopaedic Surgery

## 2020-10-15 ENCOUNTER — Other Ambulatory Visit: Payer: Self-pay

## 2020-10-15 ENCOUNTER — Ambulatory Visit (INDEPENDENT_AMBULATORY_CARE_PROVIDER_SITE_OTHER): Payer: PRIVATE HEALTH INSURANCE | Admitting: Orthopaedic Surgery

## 2020-10-15 DIAGNOSIS — M79644 Pain in right finger(s): Secondary | ICD-10-CM

## 2020-10-15 NOTE — Progress Notes (Signed)
Office Visit Note   Patient: Danielle [name]           Date of Birth: 1976/05/21           MRN: [id number] Visit Date: 10/15/2020              Requested by: Shirline Frees, NP [address] [place],  [place] [address] PCP: Shirline Frees, NP   Assessment & Plan: Visit Diagnoses:  1. Pain of right thumb     Plan: Impression is right thumb radial collateral ligament sprain with continued pain.  At this point, we will go ahead and obtain an MRI of the right thumb to assess for structural abnormalities.  She will follow-up with [place] once that has been completed.  We will continue her out of work note until her follow-up appointment after the MRI.  Follow-Up Instructions: Return for after MRI.   Orders:  No orders of the defined types were placed in this encounter.  No orders of the defined types were placed in this encounter.     Procedures: No procedures performed   Clinical Data: No additional findings.   Subjective: Chief Complaint  Patient presents with  . Right Hand - Follow-up    Right thumb radial collateral ligament sprain     HPI patient is a pleasant 45 year old female who comes in today following right thumb radial collateral ligament sprain/partial tear.  This occurred while at work after slipping on a wet floor and falling onto her right hand.  Currently, this is not being filed under [employer].  Her pain has minimally improved even with a steroid taper from a month ago and daily use of ibuprofen as needed.  She has been wearing a thumb spica splint most of the time especially while in public.  She still notes some swelling to the thenar eminence.  She has not returned to work as light duty entails using her hands.  Review of Systems as detailed in HPI.  All others reviewed and are negative.   Objective: Vital Signs: There were no vitals taken for this visit.  Physical Exam well-developed well-nourished female no acute distress.   Alert oriented x3.  Ortho Exam right thumb exam shows moderate pain along the radial collateral ligament.  She does have pain with stressing the RCL.  Limitation with opposition of the thumb secondary to pain.  She still has a hematoma to the nailbed which is tender.  The nail is intact.  Specialty Comments:  No specialty comments available.  Imaging: No new imaging   PMFS History: Patient Active Problem List   Diagnosis Date Noted  . Pain of right thumb 09/02/2020  . Viral conjunctivitis of both eyes 07/04/2018  . URI (upper respiratory infection) 07/04/2018  . Insomnia 04/25/2017  . Snoring 04/25/2017  . PCOS (polycystic ovarian syndrome) 11/10/2015  . 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 Right leg pain 10/08/2013  . Depression 12/04/2012  . H/O Graves' disease 05/08/2012   Past Medical History:  Diagnosis Date  . Asthma   . HPV in female   . Migraine   . Polycystic ovary syndrome   . Thyroid disease     Family History  Problem Relation Age of Onset  . Hyperlipidemia Mother   . Hypertension Mother   . Diabetes Mother   . Hypertension Sister   . Cancer Father        lung tumor/ lung removed  . Breast cancer Paternal Aunt   . Breast cancer Paternal  Grandmother     Past Surgical History:  Procedure Laterality Date  . cyro     Social History   Occupational History  . Not on file  Tobacco Use  . Smoking status: Current Some Day Smoker    Last attempt to quit: 06/24/2014    Years since quitting: 6.3  . Smokeless tobacco: Never Used  Substance and Sexual Activity  . Alcohol use: Yes    Alcohol/week: 0.0 standard drinks    Comment: ocassional  . Drug use: No  . Sexual activity: Yes    Birth control/protection: Pill

## 2020-10-20 ENCOUNTER — Encounter: Payer: Self-pay | Admitting: Adult Health

## 2020-10-23 ENCOUNTER — Other Ambulatory Visit: Payer: Self-pay | Admitting: Internal Medicine

## 2020-10-23 ENCOUNTER — Encounter: Payer: Self-pay | Admitting: Adult Health

## 2020-10-23 ENCOUNTER — Ambulatory Visit (INDEPENDENT_AMBULATORY_CARE_PROVIDER_SITE_OTHER): Payer: PRIVATE HEALTH INSURANCE | Admitting: Adult Health

## 2020-10-23 ENCOUNTER — Other Ambulatory Visit: Payer: Self-pay

## 2020-10-23 VITALS — BP 122/70 | HR 92 | Temp 98.6°F | Wt 190.6 lb

## 2020-10-23 DIAGNOSIS — Z7189 Other specified counseling: Secondary | ICD-10-CM

## 2020-10-23 NOTE — Progress Notes (Signed)
Subjective:    Patient ID: Danielle [name], female    DOB: 24-Nov-1975, 45 y.o.   MRN: [id number]  HPI 45 year old female who  has a past medical history of Asthma, HPV in female, Migraine, Polycystic ovary syndrome, and Thyroid disease.  She presents to the office today to discuss the various Covid 19 vaccinations. She has been holding out on getting the COVID vaccination but not has to do so to get a new job.    Review of Systems See HPI   Past Medical History:  Diagnosis Date  . Asthma   . HPV in female   . Migraine   . Polycystic ovary syndrome   . Thyroid disease     Social History   Socioeconomic History  . Marital status: Single    Spouse name: Not on file  . Number of children: Not on file  . Years of education: Not on file  . Highest education level: Not on file  Occupational History  . Not on file  Tobacco Use  . Smoking status: Current Some Day Smoker    Last attempt to quit: 06/24/2014    Years since quitting: 6.3  . Smokeless tobacco: Never Used  Substance and Sexual Activity  . Alcohol use: Yes    Alcohol/week: 0.0 standard drinks    Comment: ocassional  . Drug use: No  . Sexual activity: Yes    Birth control/protection: Pill  Other Topics Concern  . Not on file  Social History Narrative  . Not on file   Social Determinants of Health   Financial Resource Strain: Not on file  Food Insecurity: Not on file  Transportation Needs: Not on file  Physical Activity: Not on file  Stress: Not on file  Social Connections: Not on file  Intimate Partner Violence: Not on file    Past Surgical History:  Procedure Laterality Date  . cyro      Family History  Problem Relation Age of Onset  . Hyperlipidemia Mother   . Hypertension Mother   . Diabetes Mother   . Hypertension Sister   . Cancer Father        lung tumor/ lung removed  . Breast cancer Paternal Aunt   . Breast cancer Paternal Grandmother     Allergies  Allergen Reactions  .  Allegra [Fexofenadine Hcl]     Swelling occurs  . Sulfa Antibiotics   . Aspirin Rash    Baby asprin; as a child, rash from dyes     Current Outpatient Medications on File Prior to Visit  Medication Sig Dispense Refill  . cyclobenzaprine (FLEXERIL) 10 MG tablet Take 10 mg by mouth 2 (two) times daily as needed.    Janann August 125 MCG tablet Take 1 tablet by mouth once daily 30 tablet 3  . naproxen (NAPROSYN) 500 MG tablet Take 1 tablet (500 mg total) by mouth 2 (two) times daily with a meal. 30 tablet 3  . norethindrone (MICRONOR) 0.35 MG tablet Take 1 tablet by mouth once daily 84 tablet 0  . predniSONE (STERAPRED UNI-PAK 21 TAB) 10 MG (21) TBPK tablet Take as directed 21 tablet 0  . spironolactone (ALDACTONE) 50 MG tablet Take 1 tablet (50 mg total) by mouth 2 (two) times daily. 180 tablet 3  . traZODone (DESYREL) 150 MG tablet TAKE 1 TABLET BY MOUTH AT BEDTIME 90 tablet 1   No current facility-administered medications on file prior to visit.    BP 122/70 (BP Location: Left  Arm, Patient Position: Sitting, Cuff Size: Normal)   Pulse 92   Temp 98.6 F (37 C) (Oral)   Wt 190 lb 9.6 oz (86.5 kg)   SpO2 98%   BMI 28.15 kg/m       Objective:   Physical Exam Vitals and nursing note reviewed.  Constitutional:      Appearance: Normal appearance.  Neurological:     General: No focal deficit present.     Mental Status: She is alert and oriented to person, place, and time.  Psychiatric:        Mood and Affect: Mood normal.        Behavior: Behavior normal.        Thought Content: Thought content normal.        Judgment: Judgment normal.       Assessment & Plan:  1. Community health education -We discussed the various COVID-19 vaccinations.  Discussed side effects and risks versus benefits.  Ultimately advised her on either the [place] or [employer] vaccination series.  He should go to a local pharmacy to get this vaccination.  Shirline Frees, NP

## 2020-11-07 ENCOUNTER — Telehealth: Payer: Self-pay | Admitting: *Deleted

## 2020-11-07 NOTE — Telephone Encounter (Signed)
Tried calling pt to get insurance information, vm box is full. Will try again later.

## 2020-11-11 ENCOUNTER — Other Ambulatory Visit: Payer: PRIVATE HEALTH INSURANCE

## 2020-11-12 ENCOUNTER — Telehealth: Payer: Self-pay | Admitting: Orthopaedic Surgery

## 2020-11-12 NOTE — Telephone Encounter (Signed)
Called patient got recording mailbox is full and could not leave message.  Patient need MRI review appointment with Dr. Roda Shutters    MRI scheduled 11/21/2020

## 2020-11-13 ENCOUNTER — Telehealth: Payer: Self-pay | Admitting: Orthopaedic Surgery

## 2020-11-13 NOTE — Telephone Encounter (Signed)
Called patient got recording mailbox is full/could not leave message  2nd call

## 2020-11-21 ENCOUNTER — Other Ambulatory Visit: Payer: Self-pay

## 2020-11-21 ENCOUNTER — Ambulatory Visit
Admission: RE | Admit: 2020-11-21 | Discharge: 2020-11-21 | Disposition: A | Discharge status: Home / Self Care | Payer: PRIVATE HEALTH INSURANCE | Source: Ambulatory Visit | Attending: Orthopaedic Surgery | Admitting: Orthopaedic Surgery

## 2020-11-21 DIAGNOSIS — M79644 Pain in right finger(s): Secondary | ICD-10-CM

## 2020-11-21 MED ORDER — GADOBENATE DIMEGLUMINE 529 MG/ML IV SOLN
20.0000 mL | Freq: Once | INTRAVENOUS | Status: AC | PRN
  Administered 2020-11-21: 20 mL via INTRAVENOUS

## 2020-11-27 ENCOUNTER — Ambulatory Visit (INDEPENDENT_AMBULATORY_CARE_PROVIDER_SITE_OTHER): Payer: PRIVATE HEALTH INSURANCE | Admitting: Orthopaedic Surgery

## 2020-11-27 ENCOUNTER — Encounter: Payer: Self-pay | Admitting: Orthopaedic Surgery

## 2020-11-27 DIAGNOSIS — M79644 Pain in right finger(s): Secondary | ICD-10-CM | POA: Diagnosis not present

## 2020-11-27 NOTE — Progress Notes (Signed)
Office Visit Note   Patient: Danielle [name]           Date of Birth: May 10, 1976           MRN: [id number] Visit Date: 11/27/2020              Requested by: Shirline Frees, NP [address] [place],  [place] [address] PCP: Shirline Frees, NP   Assessment & Plan: Visit Diagnoses:  1. Pain of right thumb     Plan: MRI of the right thumb is negative for acute injuries.  These findings were reviewed with Danielle [name] today.  These findings give [place] reassurance that there are no structural problems therefore I think it is time to put her in hand therapy for the next 6 weeks.  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  Follow-up in 6 weeks for recheck.  Follow-Up Instructions: Return in about 6 weeks (around 01/08/2021).   Orders:  No orders of the defined types were placed in this encounter.  No orders of the defined types were placed in this encounter.     Procedures: No procedures performed   Clinical Data: No additional findings.   Subjective: Chief Complaint  Patient presents with  . Right Thumb - Pain    Danielle [name] returns today for MRI review of the right thumb.  She states that overall the symptoms are better and not occurring every day.  She feels more of a discomfort.  The pain is at worst 6 out of 10.  She remains out of work.   Review of Systems  Constitutional: Negative.   HENT: Negative.   Eyes: Negative.   Respiratory: Negative.   Cardiovascular: Negative.   Endocrine: Negative.   Musculoskeletal: Negative.   Neurological: Negative.   Hematological: Negative.   Psychiatric/Behavioral: Negative.   All other systems reviewed and are negative.    Objective: Vital Signs: There were no vitals taken for this visit.  Physical Exam Vitals and nursing note reviewed.  Constitutional:      Appearance: She is well-developed.  Pulmonary:     Effort:  Pulmonary effort is normal.  Skin:    General: Skin is warm.     Capillary Refill: Capillary refill takes less than 2 seconds.  Neurological:     Mental Status: She is alert and oriented to person, place, and time.  Psychiatric:        Behavior: Behavior normal.        Thought Content: Thought content normal.        Judgment: Judgment normal.     Ortho Exam Right thumb shows mild swelling.  Mild tenderness throughout the metacarpal and the metacarpal phalangeal joint.  No motor or sensory deficits.   Specialty Comments:  No specialty comments available.  Imaging: No results found.   PMFS History: Patient Active Problem List   Diagnosis Date Noted  . Pain of right thumb 09/02/2020  . Viral conjunctivitis of both eyes 07/04/2018  . URI (upper respiratory infection) 07/04/2018  . Insomnia 04/25/2017  . Snoring 04/25/2017  . PCOS (polycystic ovarian syndrome) 11/10/2015  . 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 Right leg pain 10/08/2013  . Depression 12/04/2012  . H/O Graves' disease 05/08/2012   Past Medical History:  Diagnosis Date  . Asthma   . HPV in female   . Migraine   . Polycystic ovary syndrome   .  Thyroid disease     Family History  Problem Relation Age of Onset  . Hyperlipidemia Mother   . Hypertension Mother   . Diabetes Mother   . Hypertension Sister   . Cancer Father        lung tumor/ lung removed  . Breast cancer Paternal Aunt   . Breast cancer Paternal Grandmother     Past Surgical History:  Procedure Laterality Date  . cyro     Social History   Occupational History  . Not on file  Tobacco Use  . Smoking status: Current Some Day Smoker    Last attempt to quit: 06/24/2014    Years since quitting: 6.4  . Smokeless tobacco: Never Used  Substance and Sexual Activity  . Alcohol use: Yes    Alcohol/week: 0.0 standard drinks    Comment: ocassional  . Drug use: No  . Sexual activity: Yes    Birth control/protection: Pill

## 2020-12-03 ENCOUNTER — Telehealth: Payer: Self-pay

## 2020-12-03 NOTE — Telephone Encounter (Signed)
Pt came into the office stating that she received a note at her last visit but it didn't have a return to work date.  Her job Is requesting a new note with the return to work date on it.

## 2020-12-03 NOTE — Telephone Encounter (Signed)
Per last work note.   Danielle [name] was seen in my clinic on 11/27/2020. 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

## 2020-12-03 NOTE — Telephone Encounter (Signed)
Sent her mychart message.  

## 2020-12-04 NOTE — Telephone Encounter (Signed)
Whatever day she says is ok as long as it is reasonable

## 2020-12-04 NOTE — Telephone Encounter (Signed)
What date would you like to return to work?

## 2020-12-13 ENCOUNTER — Other Ambulatory Visit: Payer: Self-pay | Admitting: Internal Medicine

## 2020-12-13 ENCOUNTER — Other Ambulatory Visit: Payer: Self-pay | Admitting: Adult Health

## 2020-12-13 DIAGNOSIS — Z720 Tobacco use: Secondary | ICD-10-CM

## 2020-12-25 ENCOUNTER — Other Ambulatory Visit: Payer: Self-pay | Admitting: Adult Health

## 2020-12-25 DIAGNOSIS — Z30011 Encounter for initial prescription of contraceptive pills: Secondary | ICD-10-CM

## 2021-02-24 ENCOUNTER — Other Ambulatory Visit (HOSPITAL_BASED_OUTPATIENT_CLINIC_OR_DEPARTMENT_OTHER): Payer: Self-pay

## 2021-03-12 ENCOUNTER — Ambulatory Visit: Payer: No Typology Code available for payment source | Admitting: Internal Medicine

## 2021-04-13 ENCOUNTER — Other Ambulatory Visit: Payer: Self-pay | Admitting: Internal Medicine

## 2021-05-29 ENCOUNTER — Other Ambulatory Visit: Payer: Self-pay | Admitting: Internal Medicine

## 2021-05-29 NOTE — Telephone Encounter (Signed)
NO FURTHER REFILLS without an appt

## 2021-06-05 ENCOUNTER — Telehealth: Payer: Self-pay | Admitting: Adult Health

## 2021-06-05 DIAGNOSIS — B009 Herpesviral infection, unspecified: Secondary | ICD-10-CM

## 2021-06-05 MED ORDER — VALACYCLOVIR HCL 1 G PO TABS: 1000.0000 mg | ORAL_TABLET | Freq: Every day | ORAL | 0 refills | Status: AC

## 2021-06-05 NOTE — Telephone Encounter (Signed)
Rx sent to pharmacy. Pt advised that she can use Good[facility] to help pay for her medication. Pt verbalized understanding. No further actions needed!

## 2021-06-05 NOTE — Telephone Encounter (Signed)
Ok for Rx? Last refilled 02/2020 with 3 refills

## 2021-06-05 NOTE — Telephone Encounter (Signed)
Patient called because she is needing a refill on valACYclovir (VALTREX) 1000 MG tablet. Patient states she no longer has insurance so if there is a cheaper option then to please send that one. I did let patient know that we won't know until we send it to the Pharmacy. Please call when prescription is sent.    Please send to  [facility] [address], [place] - [address] Phone:  [phone]  Fax:  [phone]        Good callback number is [phone]    Please advise

## 2021-07-03 ENCOUNTER — Telehealth: Payer: PRIVATE HEALTH INSURANCE | Admitting: Emergency Medicine

## 2021-07-03 DIAGNOSIS — N760 Acute vaginitis: Secondary | ICD-10-CM | POA: Diagnosis not present

## 2021-07-03 MED ORDER — FLUCONAZOLE 150 MG PO TABS: 150.0000 mg | ORAL_TABLET | Freq: Once | ORAL | 0 refills | Status: AC

## 2021-07-03 MED ORDER — METRONIDAZOLE 500 MG PO TABS: 500.0000 mg | ORAL_TABLET | Freq: Two times a day (BID) | ORAL | 0 refills | Status: DC

## 2021-07-03 NOTE — Progress Notes (Signed)
E-Visit for Vaginal Symptoms  We are sorry that you are not feeling well. Here is how we plan to help! Based on what you shared with me it looks like you: May have a vaginosis due to bacteria  Vaginosis is an inflammation of the vagina that can result in discharge, itching and pain. The cause is usually a change in the normal balance of vaginal bacteria or an infection. Vaginosis can also result from reduced estrogen levels after menopause.  The most common causes of vaginosis are:   Bacterial vaginosis which results from an overgrowth of one on several organisms that are normally present in your vagina.   Yeast infections which are caused by a naturally occurring fungus called candida.   Vaginal atrophy (atrophic vaginosis) which results from the thinning of the vagina from reduced estrogen levels after menopause.   Trichomoniasis which is caused by a parasite and is commonly transmitted by sexual intercourse.  Factors that increase your risk of developing vaginosis include: Medications, such as antibiotics and steroids Uncontrolled diabetes Use of hygiene products such as bubble bath, vaginal spray or vaginal deodorant Douching Wearing damp or tight-fitting clothing Using an intrauterine device (IUD) for birth control Hormonal changes, such as those associated with pregnancy, birth control pills or menopause Sexual activity Having a sexually transmitted infection  Your treatment plan is Metronidazole or Flagyl 500mg  twice a day for 7 days.  I have electronically sent this prescription into the pharmacy that you have chosen.  I've also sent 1 tablet of Diflucan to be taken after you complete the antibiotic that should treat any yeast infection.  Be sure to take all of the medication as directed. Stop taking any medication if you develop a rash, tongue swelling or shortness of breath. Mothers who are breast feeding should consider pumping and discarding their breast milk while on  these antibiotics. However, there is no consensus that infant exposure at these doses would be harmful.  Remember that medication creams can weaken latex condoms. Marland Kitchen   HOME CARE:  Good hygiene may prevent some types of vaginosis from recurring and may relieve some symptoms:  Avoid baths, hot tubs and whirlpool spas. Rinse soap from your outer genital area after a shower, and dry the area well to prevent irritation. Don't use scented or harsh soaps, such as those with deodorant or antibacterial action. Avoid irritants. These include scented tampons and pads. Wipe from front to back after using the toilet. Doing so avoids spreading fecal bacteria to your vagina.  Other things that may help prevent vaginosis include:  Don't douche. Your vagina doesn't require cleansing other than normal bathing. Repetitive douching disrupts the normal organisms that reside in the vagina and can actually increase your risk of vaginal infection. Douching won't clear up a vaginal infection. Use a latex condom. Both female and female latex condoms may help you avoid infections spread by sexual contact. Wear cotton underwear. Also wear pantyhose with a cotton crotch. If you feel comfortable without it, skip wearing underwear to bed. Yeast thrives in [employer] Your symptoms should improve in the next day or two.  GET HELP RIGHT AWAY IF:  You have pain in your lower abdomen ( pelvic area or over your ovaries) You develop nausea or vomiting You develop a fever Your discharge changes or worsens You have persistent pain with intercourse You develop shortness of breath, a rapid pulse, or you faint.  These symptoms could be signs of problems or infections that need to be evaluated  by a medical provider now.  MAKE SURE YOU   Understand these instructions. Will watch your condition. Will get help right away if you are not doing well or get worse.  Thank you for choosing an e-visit.  Your e-visit answers  were reviewed by a board certified advanced clinical practitioner to complete your personal care plan. Depending upon the condition, your plan could have included both over the counter or prescription medications.  Please review your pharmacy choice. Make sure the pharmacy is open so you can pick up prescription now. If there is a problem, you may contact your provider through [employer] and have the prescription routed to another pharmacy.  Your safety is important to [place]. If you have drug allergies check your prescription carefully.   For the next 24 hours you can use MyChart to ask questions about today's visit, request a non-urgent call back, or ask for a work or school excuse. You will get an email in the next two days asking about your experience. I hope that your e-visit has been valuable and will speed your recovery.  Approximately 5 minutes was used in reviewing the patient's chart, questionnaire, prescribing medications, and documentation.

## 2021-08-19 ENCOUNTER — Telehealth: Payer: PRIVATE HEALTH INSURANCE | Admitting: Family

## 2021-08-19 ENCOUNTER — Other Ambulatory Visit: Payer: Self-pay | Admitting: Internal Medicine

## 2021-08-19 DIAGNOSIS — N898 Other specified noninflammatory disorders of vagina: Secondary | ICD-10-CM

## 2021-08-19 DIAGNOSIS — B3731 Acute candidiasis of vulva and vagina: Secondary | ICD-10-CM

## 2021-08-19 MED ORDER — FLUCONAZOLE 150 MG PO TABS: 150.0000 mg | ORAL_TABLET | ORAL | 0 refills | Status: DC | PRN

## 2021-08-19 NOTE — Progress Notes (Signed)

## 2021-08-28 ENCOUNTER — Telehealth: Payer: PRIVATE HEALTH INSURANCE | Admitting: Physician Assistant

## 2021-08-28 DIAGNOSIS — J069 Acute upper respiratory infection, unspecified: Secondary | ICD-10-CM

## 2021-08-28 MED ORDER — FLUTICASONE PROPIONATE 50 MCG/ACT NA SUSP: 2.0000 | Freq: Every day | NASAL | 0 refills | Status: DC

## 2021-08-28 MED ORDER — BENZONATATE 100 MG PO CAPS: 100.0000 mg | ORAL_CAPSULE | Freq: Three times a day (TID) | ORAL | 0 refills | Status: DC | PRN

## 2021-08-28 NOTE — Progress Notes (Signed)

## 2021-08-28 NOTE — Progress Notes (Signed)
I have spent 5 minutes in review of e-visit questionnaire, review and updating patient chart, medical decision making and response to patient.   Shaniquia Brafford Cody Latrena Benegas, PA-C    

## 2021-09-28 ENCOUNTER — Ambulatory Visit (INDEPENDENT_AMBULATORY_CARE_PROVIDER_SITE_OTHER): Payer: 59 | Admitting: Internal Medicine

## 2021-09-28 ENCOUNTER — Encounter: Payer: Self-pay | Admitting: Internal Medicine

## 2021-09-28 VITALS — BP 128/82 | HR 94 | Ht 69.0 in | Wt 194.8 lb

## 2021-09-28 DIAGNOSIS — Z8639 Personal history of other endocrine, nutritional and metabolic disease: Secondary | ICD-10-CM | POA: Diagnosis not present

## 2021-09-28 DIAGNOSIS — E282 Polycystic ovarian syndrome: Secondary | ICD-10-CM

## 2021-09-28 DIAGNOSIS — E890 Postprocedural hypothyroidism: Secondary | ICD-10-CM | POA: Diagnosis not present

## 2021-09-28 DIAGNOSIS — R7303 Prediabetes: Secondary | ICD-10-CM

## 2021-09-28 LAB — MICROALBUMIN / CREATININE URINE RATIO
Creatinine,U: 162.1 mg/dL
Microalb Creat Ratio: 0.4 mg/g (ref 0.0–30.0)
Microalb, Ur: <0.7 mg/dL (ref 0.0–1.9)

## 2021-09-28 LAB — T4, FREE: Free T4: 0.73 ng/dL (ref 0.60–1.60)

## 2021-09-28 LAB — LIPID PANEL
Cholesterol: 242 mg/dL — ABNORMAL HIGH (ref 0–200)
HDL: 63.8 mg/dL (ref >39.00)
LDL Cholesterol: 159 mg/dL — ABNORMAL HIGH (ref 0–99)
NonHDL: 177.98
Total CHOL/HDL Ratio: 4
Triglycerides: 95 mg/dL (ref 0.0–149.0)
VLDL: 19 mg/dL (ref 0.0–40.0)

## 2021-09-28 LAB — TSH: TSH: 12.4 u[IU]/mL — ABNORMAL HIGH (ref 0.35–5.50)

## 2021-09-28 LAB — HEMOGLOBIN A1C: Hgb A1c MFr Bld: 6.9 % — ABNORMAL HIGH (ref 4.6–6.5)

## 2021-09-28 NOTE — Patient Instructions (Addendum)
Please stop at the lab.  Please continue Levothyroxine 125 mcg daily.  Take the thyroid hormone every day, with water, at least 30 minutes before breakfast, separated by at least 4 hours from: - acid reflux medications - calcium - iron - multivitamins  Please come back for a follow-up appointment in 6 months.  

## 2021-09-28 NOTE — Progress Notes (Signed)
Patient ID: Danielle [name], female   DOB: 02-Apr-1976, 46 y.o.   MRN: [id number] ? ?This visit occurred during the SARS-CoV-2 public health emergency.  Safety protocols were in place, including screening questions prior to the visit, additional usage of staff PPE, and extensive cleaning of exam room while observing appropriate contact time as indicated for disinfecting solutions.  ? ?HPI  ?Danielle [name] is a 46 y.o.-year-old female, returning for f/u for postablative hypothyroidism (dx in 03/2012 after RAI tx for Graves Ds) and PCOS. Last visit 1 year ago. ? ?Interim history: ?She had an episode of syncope, last 04/2020, preceded by changes in vision ("spotty vision"), and heat sensation. Prev.  presyncope in 08/2019. No HAs.  She saw optometry-no abnormalities.  Since last visit, she did not have time to see neurology, as I recommended at last visit.  She continues to have occasional "spotty" vision, but not associated with the rest of the symptoms mentioned above.  This is not with change in position, and it can happen when driving for example. ?She has allergies >> congestion. ?She mentions weight gain, but she only gained a net 2 pounds since last visit per our scale. ? ?Hypothyroidism: ?She has a history of medication noncompliance with levothyroxine. ?In 04/2018, she was missing levothyroxine doses and her TSH was high (at that time she had increased stress as she separated from her husband).  She improved compliance afterwards but continues to miss doses. ?She stopped Trazodone >> now herbal teas, magnesium. ? ?Pt is on levothyroxine 125 mcg daily, taken: ?- still missing it 1x a week! ?- in am ?- fasting ?- at least 30 min from b'fast ?- no Ca, Fe, PPIs, + occasional multivitamins at different times of the day ?- + on Biotin ([employer]) - last dose 2 weeks ago ? ?Reviewed her TFTs: ?Lab Results  ?Component Value Date  ? TSH 6.06 (H) 04/10/2020  ? TSH 6.20 (H) 02/05/2020  ? TSH 2.89  11/12/2019  ? TSH 2.26 05/17/2019  ? TSH 15.43 (H) 05/16/2018  ? TSH 2.70 05/13/2017  ? TSH 3.99 11/11/2016  ? TSH 4.67 (H) 07/13/2016  ? TSH 1.40 03/05/2016  ? TSH 8.27 (H) 11/10/2015  ? FREET4 0.88 04/10/2020  ? FREET4 1.18 11/12/2019  ? FREET4 1.04 05/17/2019  ? FREET4 0.68 05/16/2018  ? FREET4 1.07 05/13/2017  ? FREET4 0.87 11/11/2016  ? FREET4 0.92 07/13/2016  ? FREET4 1.34 03/05/2016  ? FREET4 0.80 11/10/2015  ? FREET4 0.54 (L) 08/19/2015  ?05/06/2014: (Dr. Dareen PianoAnderson, ObGyn): TSH 12.58 ? ?At last check, TSI's were undetectable: ?Lab Results  ?Component Value Date  ? TSI <89 05/16/2018  ?  ?Pt denies: ?- feeling nodules in neck ?- hoarseness ?- dysphagia ?- choking ?- SOB with lying down ? ?She has + FH of thyroid disorders in: mother. No FH of thyroid cancer. No h/o radiation tx to head or neck. ? ?No herbal supplements.+ Biotin use. No recent steroids use.  ? ?PCOS: ?-She had heavy bleeding, after which we started OCPs in 2017, but continued to have increased bleeding so she was switched to NuvaRing, however, bleeding continued so she stopped it.  She was then in a research trial with a form of oral contraceptive.  Her bleeding improved.  The study finished in 08/2019. ? ?-In 2021, she wanted to switch back to oral contraceptives.  Due to previous history of increased vaginal bleeding on these, I directed her back to OB/GYN >> she restarted OCPs before our  last visit -norethindrone.  However, since last visit, she came off this. ?ObGyn: Dr. Leone Payor. ? ?-In 08/2020, she wanted to start back spironolactone.  I sent a prescription for this but she did not restart.  As of now, she is unsure whether she wants to start back on this ? ?Prediabetes: ? ?Reviewed HbA1c levels:: ?Lab Results  ?Component Value Date  ? HGBA1C 5.9 (A) 09/08/2020  ? HGBA1C 6.2 04/10/2020  ? HGBA1C 6.2 (A) 11/12/2019  ? HGBA1C 6.4 05/17/2019  ? HGBA1C 6.2 05/16/2018  ? HGBA1C 5.9 08/15/2012  ? ?Prior insurance did not cover nutrition  visits. ? ?No CKD: ?Lab Results  ?Component Value Date  ? BUN 10 02/05/2020  ? ?Lab Results  ?Component Value Date  ? CREATININE 0.81 02/05/2020  ? ?+ HL: ?Lab Results  ?Component Value Date  ? CHOL 207 (H) 02/05/2020  ? HDL 59 02/05/2020  ? LDLCALC 128 (H) 02/05/2020  ? TRIG 94 02/05/2020  ? CHOLHDL 3.5 02/05/2020  ?She is not on a statin. ? ?ROS: ? + see HPI ? ?I reviewed pt's medications, allergies, PMH, social hx, family hx, and changes were documented in the history of present illness. Otherwise, unchanged from my initial visit note. ? ?Past Medical History:  ?Diagnosis Date  ? Asthma   ? HPV in female   ? Migraine   ? Polycystic ovary syndrome   ? Thyroid disease   ? ?Past Surgical History:  ?Procedure Laterality Date  ? cyro    ? ?History  ? ?Social History  ? Marital Status: Married  ?  Spouse Name: N/A  ?  Number of Children: 0  ? ?Occupational History  ?   ? ?Social History Main Topics  ? Smoking status: Former Smoker  ? Smokeless tobacco: Not on file  ? Alcohol Use: Yes  ? Drug Use: No  ? Sexual Activity: Yes  ?  Birth Control/ Protection: Pill  ? ?Current Outpatient Medications on File Prior to Visit  ?Medication Sig Dispense Refill  ? benzonatate (TESSALON) 100 MG capsule Take 1 capsule (100 mg total) by mouth 3 (three) times daily as needed for cough. 30 capsule 0  ? cyclobenzaprine (FLEXERIL) 10 MG tablet Take 10 mg by mouth 2 (two) times daily as needed.    ? fluconazole (DIFLUCAN) 150 MG tablet Take 1 tablet (150 mg total) by mouth every three (3) days as needed. 2 tablet 0  ? fluticasone (FLONASE) 50 MCG/ACT nasal spray Place 2 sprays into both nostrils daily. 16 g 0  ? levothyroxine (SYNTHROID) 125 MCG tablet Take 1 tablet by mouth once daily 30 tablet 0  ? metroNIDAZOLE (FLAGYL) 500 MG tablet Take 1 tablet (500 mg total) by mouth 2 (two) times daily. 14 tablet 0  ? naproxen (NAPROSYN) 500 MG tablet Take 1 tablet (500 mg total) by mouth 2 (two) times daily with a meal. 30 tablet 3  ? norethindrone  (MICRONOR) 0.35 MG tablet Take 1 tablet by mouth once daily 84 tablet 0  ? predniSONE (STERAPRED UNI-PAK 21 TAB) 10 MG (21) TBPK tablet Take as directed 21 tablet 0  ? spironolactone (ALDACTONE) 50 MG tablet Take 1 tablet (50 mg total) by mouth 2 (two) times daily. 180 tablet 3  ? traZODone (DESYREL) 150 MG tablet TAKE 1 TABLET BY MOUTH AT BEDTIME 90 tablet 1  ? ?No current facility-administered medications on file prior to visit.  ? ?Allergies  ?Allergen Reactions  ? Allegra [Fexofenadine Hcl]   ?  Swelling occurs  ?  Sulfa Antibiotics   ? Aspirin Rash  ?  Baby asprin; as a child, rash from dyes   ? ?Family History  ?Problem Relation Age of Onset  ? Hyperlipidemia Mother   ? Hypertension Mother   ? Diabetes Mother   ? Hypertension Sister   ? Cancer Father   ?     lung tumor/ lung removed  ? Breast cancer Paternal Aunt   ? Breast cancer Paternal Grandmother   ? ?PE: ?BP 128/82 (BP Location: Right Arm, Patient Position: Sitting, Cuff Size: Normal)   Pulse 94   Ht 5\' 9"  (1.753 m)   Wt 194 lb 12.8 oz (88.4 kg)   SpO2 98%   BMI 28.77 kg/m?  ?Wt Readings from Last 3 Encounters:  ?09/28/21 194 lb 12.8 oz (88.4 kg)  ?10/23/20 190 lb 9.6 oz (86.5 kg)  ?09/08/20 192 lb 3.2 oz (87.2 kg)  ? ?Constitutional: overweight, in NAD ?Eyes: PERRLA, EOMI, no exophthalmos ?ENT: moist mucous membranes, no thyromegaly, no cervical lymphadenopathy ?Cardiovascular: tachycardia, RR, No MRG ?Respiratory: CTA B ?Musculoskeletal: no deformities, strength intact in all 4 ?Skin: moist, warm, no rashes ?Neurological: no tremor with outstretched hands, DTR normal in all 4 ? ?ASSESSMENT: ?1. Postablative Hypothyroidism ?- after RAI tx for Graves ds. ? ?2.  History of Graves' disease ? ?3. PCOS ? ?4.  Prediabetes ? ?PLAN:  ?1. Patient with longstanding hypothyroidism, with history of incomplete compliance with levothyroxine. ?- latest thyroid labs reviewed with pt. >> TSH was elevated: ?Lab Results  ?Component Value Date  ? TSH 6.06 (H)  04/10/2020  ?- she continues on LT4 125 mcg daily ?- pt does mention some weight gain (she only gained 2 pounds since last visit per our scale), also irritability ?- we discussed about taking the thyroid hormone every d

## 2021-09-29 ENCOUNTER — Ambulatory Visit (INDEPENDENT_AMBULATORY_CARE_PROVIDER_SITE_OTHER): Payer: 59

## 2021-09-29 ENCOUNTER — Encounter: Payer: Self-pay | Admitting: Adult Health

## 2021-09-29 ENCOUNTER — Ambulatory Visit (INDEPENDENT_AMBULATORY_CARE_PROVIDER_SITE_OTHER): Payer: 59 | Admitting: Adult Health

## 2021-09-29 VITALS — BP 100/78 | HR 90 | Temp 98.5°F | Ht 69.0 in | Wt 198.0 lb

## 2021-09-29 DIAGNOSIS — R058 Other specified cough: Secondary | ICD-10-CM | POA: Diagnosis not present

## 2021-09-29 DIAGNOSIS — M25571 Pain in right ankle and joints of right foot: Secondary | ICD-10-CM

## 2021-09-29 LAB — COMPLETE METABOLIC PANEL WITH GFR
AG Ratio: 1.3 (calc) (ref 1.0–2.5)
ALT: 15 U/L (ref 6–29)
AST: 13 U/L (ref 10–35)
Albumin: 4.2 g/dL (ref 3.6–5.1)
Alkaline phosphatase (APISO): 49 U/L (ref 31–125)
BUN: 15 mg/dL (ref 7–25)
CO2: 22 mmol/L (ref 20–32)
Calcium: 9.5 mg/dL (ref 8.6–10.2)
Chloride: 106 mmol/L (ref 98–110)
Creat: 0.79 mg/dL (ref 0.50–0.99)
Globulin: 3.2 g/dL (calc) (ref 1.9–3.7)
Glucose, Bld: 114 mg/dL — ABNORMAL HIGH (ref 65–99)
Potassium: 4.4 mmol/L (ref 3.5–5.3)
Sodium: 137 mmol/L (ref 135–146)
Total Bilirubin: 0.4 mg/dL (ref 0.2–1.2)
Total Protein: 7.4 g/dL (ref 6.1–8.1)
eGFR: 94 mL/min/{1.73_m2} (ref >=60)

## 2021-09-29 MED ORDER — PREDNISONE 10 MG PO TABS: ORAL_TABLET | ORAL | 0 refills | Status: DC

## 2021-09-29 MED ORDER — LEVOTHYROXINE SODIUM 137 MCG PO TABS: 125.0000 ug | ORAL_TABLET | Freq: Every day | ORAL | 3 refills | Status: DC

## 2021-09-29 MED ORDER — GUAIFENESIN-CODEINE 100-10 MG/5ML PO SOLN: 10.0000 mL | Freq: Three times a day (TID) | ORAL | 0 refills | Status: DC | PRN

## 2021-09-29 NOTE — Progress Notes (Signed)
? ?Subjective:  ? ? Patient ID: Danielle [name], female    DOB: Nov 18, 1975, 46 y.o.   MRN: [id number] ? ?HPI ?46 year old female who  has a past medical history of Asthma, HPV in female, Migraine, Polycystic ovary syndrome, and Thyroid disease. ? ?She presents to the office today for multiple issues.  ? ?For the last month she has developed a dry cough that has been intermittent but daily. She denies fevers, chills, sinus pain or pressure. She does report wheezing at night. No shortness of breath. She was seen about a month ago virtually and prescribed tessalon pearls which she did not help. She has also been using various OTC medications without improvement  ?Right ankle pain and swelling - reports that about six weeks ago she rolled her right ankle. The first week she was unable to walk on it. As time has gone on the pain and swelling has improved but is still present  ? ? ?Review of Systems ?See HPI  ? ?Past Medical History:  ?Diagnosis Date  ? Asthma   ? HPV in female   ? Migraine   ? Polycystic ovary syndrome   ? Thyroid disease   ? ? ?Social History  ? ?Socioeconomic History  ? Marital status: Single  ?  Spouse name: Not on file  ? Number of children: Not on file  ? Years of education: Not on file  ? Highest education level: Not on file  ?Occupational History  ? Not on file  ?Tobacco Use  ? Smoking status: Some Days  ?  Types: Cigarettes  ?  Last attempt to quit: 06/24/2014  ?  Years since quitting: 7.2  ? Smokeless tobacco: Never  ?Substance and Sexual Activity  ? Alcohol use: Yes  ?  Alcohol/week: 0.0 standard drinks  ?  Comment: ocassional  ? Drug use: No  ? Sexual activity: Yes  ?  Birth control/protection: Pill  ?Other Topics Concern  ? Not on file  ?Social History Narrative  ? Not on file  ? ?Social Determinants of Health  ? ?Financial Resource Strain: Not on file  ?Food Insecurity: Not on file  ?Transportation Needs: Not on file  ?Physical Activity: Not on file  ?Stress: Not on file  ?Social  Connections: Not on file  ?Intimate Partner Violence: Not on file  ? ? ?Past Surgical History:  ?Procedure Laterality Date  ? cyro    ? ? ?Family History  ?Problem Relation Age of Onset  ? Hyperlipidemia Mother   ? Hypertension Mother   ? Diabetes Mother   ? Hypertension Sister   ? Cancer Father   ?     lung tumor/ lung removed  ? Breast cancer Paternal Aunt   ? Breast cancer Paternal Grandmother   ? ? ?Allergies  ?Allergen Reactions  ? Allegra [Fexofenadine Hcl]   ?  Swelling occurs  ? Sulfa Antibiotics   ? Aspirin Rash  ?  Baby asprin; as a child, rash from dyes   ? ? ?Current Outpatient Medications on File Prior to Visit  ?Medication Sig Dispense Refill  ? cyclobenzaprine (FLEXERIL) 10 MG tablet Take 10 mg by mouth 2 (two) times daily as needed.    ? fluticasone (FLONASE) 50 MCG/ACT nasal spray Place 2 sprays into both nostrils daily. 16 g 0  ? levothyroxine (SYNTHROID) 125 MCG tablet Take 1 tablet by mouth once daily 30 tablet 0  ? metroNIDAZOLE (FLAGYL) 500 MG tablet Take 1 tablet (500 mg total) by mouth 2 (  two) times daily. 14 tablet 0  ? naproxen (NAPROSYN) 500 MG tablet Take 1 tablet (500 mg total) by mouth 2 (two) times daily with a meal. 30 tablet 3  ? spironolactone (ALDACTONE) 50 MG tablet Take 1 tablet (50 mg total) by mouth 2 (two) times daily. 180 tablet 3  ? ?No current facility-administered medications on file prior to visit.  ? ? ?BP 100/78   Pulse 90   Temp 98.5 ?F (36.9 ?C) (Oral)   Ht 5\' 9"  (1.753 m)   Wt 198 lb (89.8 kg)   SpO2 97%   BMI 29.24 kg/m?  ? ? ?   ?Objective:  ? Physical Exam ?Vitals and nursing note reviewed.  ?Constitutional:   ?   Appearance: Normal appearance.  ?Cardiovascular:  ?   Rate and Rhythm: Normal rate and regular rhythm.  ?   Pulses: Normal pulses.  ?   Heart sounds: Normal heart sounds.  ?Pulmonary:  ?   Effort: Pulmonary effort is normal.  ?   Breath sounds: Normal breath sounds.  ?Musculoskeletal:     ?   General: Swelling and tenderness present. No deformity.  Normal range of motion.  ?Skin: ?   General: Skin is warm and dry.  ?   Capillary Refill: Capillary refill takes less than 2 seconds.  ?Neurological:  ?   General: No focal deficit present.  ?   Mental Status: She is alert and oriented to person, place, and time.  ?Psychiatric:     ?   Mood and Affect: Mood normal.     ?   Behavior: Behavior normal.     ?   Thought Content: Thought content normal.     ?   Judgment: Judgment normal.  ? ?   ?Assessment & Plan:  ?1. Acute right ankle pain ?- likely liagment/tendon sprain. She has full ROM of right ankle and foot.  ?- DG Ankle Complete Right; Future ? ?2. Post-viral cough syndrome ? ?- guaiFENesin-codeine 100-10 MG/5ML syrup; Take 10 mLs by mouth 3 (three) times daily as needed for cough.  Dispense: 120 mL; Refill: 0 ?- predniSONE (DELTASONE) 10 MG tablet; 40 mg x 3 days, 20 mg x 3 days, 10 mg x 3 days  Dispense: 21 tablet; Refill: 0 ? ? , NP ? ?

## 2021-10-27 ENCOUNTER — Encounter: Payer: Self-pay | Admitting: Internal Medicine

## 2021-11-11 ENCOUNTER — Ambulatory Visit (HOSPITAL_COMMUNITY)
Admission: EM | Admit: 2021-11-11 | Discharge: 2021-11-11 | Disposition: A | Discharge status: Home / Self Care | Payer: Self-pay | Attending: Family Medicine | Admitting: Family Medicine

## 2021-11-11 ENCOUNTER — Ambulatory Visit (INDEPENDENT_AMBULATORY_CARE_PROVIDER_SITE_OTHER): Payer: Self-pay

## 2021-11-11 ENCOUNTER — Telehealth: Payer: Self-pay | Admitting: Adult Health

## 2021-11-11 ENCOUNTER — Encounter (HOSPITAL_COMMUNITY): Payer: Self-pay

## 2021-11-11 DIAGNOSIS — R0602 Shortness of breath: Secondary | ICD-10-CM

## 2021-11-11 DIAGNOSIS — R051 Acute cough: Secondary | ICD-10-CM

## 2021-11-11 DIAGNOSIS — R062 Wheezing: Secondary | ICD-10-CM

## 2021-11-11 DIAGNOSIS — R0989 Other specified symptoms and signs involving the circulatory and respiratory systems: Secondary | ICD-10-CM

## 2021-11-11 DIAGNOSIS — J4521 Mild intermittent asthma with (acute) exacerbation: Secondary | ICD-10-CM

## 2021-11-11 MED ORDER — ALBUTEROL SULFATE HFA 108 (90 BASE) MCG/ACT IN AERS: 1.0000 | INHALATION_SPRAY | Freq: Four times a day (QID) | RESPIRATORY_TRACT | 1 refills | Status: AC | PRN

## 2021-11-11 MED ORDER — PROMETHAZINE-DM 6.25-15 MG/5ML PO SYRP: 5.0000 mL | ORAL_SOLUTION | Freq: Four times a day (QID) | ORAL | 0 refills | Status: DC | PRN

## 2021-11-11 MED ORDER — IPRATROPIUM-ALBUTEROL 0.5-2.5 (3) MG/3ML IN SOLN
RESPIRATORY_TRACT | Status: AC
  Filled 2021-11-11: qty 3

## 2021-11-11 MED ORDER — AZITHROMYCIN 250 MG PO TABS: 250.0000 mg | ORAL_TABLET | Freq: Every day | ORAL | 0 refills | Status: DC

## 2021-11-11 MED ORDER — PREDNISONE 20 MG PO TABS: 40.0000 mg | ORAL_TABLET | Freq: Every day | ORAL | 0 refills | Status: DC

## 2021-11-11 MED ORDER — IPRATROPIUM-ALBUTEROL 0.5-2.5 (3) MG/3ML IN SOLN
3.0000 mL | Freq: Once | RESPIRATORY_TRACT | Status: AC
  Administered 2021-11-11: 3 mL via RESPIRATORY_TRACT

## 2021-11-11 NOTE — ED Triage Notes (Addendum)
Pt present coughing with congestion and SOB. Symptoms started two week ago. Pt tried otc medication with no relief. Pt took an at home covid test today and results was negative.  ?

## 2021-11-11 NOTE — Telephone Encounter (Signed)
Spoke to pt and she stated that she has "uncontrollable coughing having sob and wheezing sx started yesterday. Pt had contact with a positive person.Pt stated that she took an at home Covid test today and was neg. Pt advised to mask up and retest she may have tested too early. Advised pt to call if she needs an appt. Or any advise. ?

## 2021-11-11 NOTE — Telephone Encounter (Signed)
911 outcome from triage ?Patient is SOB with every breath, wheezing also. Was in contact with someone who had covid but person should have been out of contagiousness window by then. Refused to call. ?

## 2021-11-11 NOTE — ED Provider Notes (Addendum)
?[facility] ? ? ?[id number] ?11/11/21 Arrival Time: 1201 ? ?ASSESSMENT & PLAN: ? ?1. Mild intermittent asthma with acute exacerbation   ?2. Acute cough   ?3. Wheezing   ? ?I have personally viewed the imaging studies ordered this visit. ?No PNA on CXR. ? ?No resp distress. ?Reports improvement after Duoneb. ? ?Meds ordered this encounter  ?Medications  ? ipratropium-albuterol (DUONEB) 0.5-2.5 (3) MG/3ML nebulizer solution 3 mL  ? predniSONE (DELTASONE) 20 MG tablet  ?  Sig: Take 2 tablets (40 mg total) by mouth daily.  ?  Dispense:  10 tablet  ?  Refill:  0  ? albuterol (VENTOLIN HFA) 108 (90 Base) MCG/ACT inhaler  ?  Sig: Inhale 1-2 puffs into the lungs every 6 (six) hours as needed for wheezing or shortness of breath.  ?  Dispense:  1 each  ?  Refill:  1  ? promethazine-dextromethorphan (PROMETHAZINE-DM) 6.25-15 MG/5ML syrup  ?  Sig: Take 5 mLs by mouth 4 (four) times daily as needed for cough.  ?  Dispense:  118 mL  ?  Refill:  0  ? azithromycin (ZITHROMAX) 250 MG tablet  ?  Sig: Take 1 tablet (250 mg total) by mouth daily. Take first 2 tablets together, then 1 every day until finished.  ?  Dispense:  6 tablet  ?  Refill:  0  ? ?Will hold off on filling Zithromax. To fill if she is not improving over next 48-72 hours. ? ?Asthma precautions given. ?OTC symptom care as needed. ? ?Agrees to ED eval should symptoms worsen. ? ?Reviewed expectations re: course of current medical issues. Questions answered. ?Outlined signs and symptoms indicating need for more acute intervention. ?Patient verbalized understanding. ?After Visit Summary given. ? ?SUBJECTIVE: ?History from: patient. ? ?Danielle [name] is a 46 y.o. female who presents with complaint of wheezing and coughing; x 2 weeks. SOB at times. Afebrile. Has been feeling fatigued. Smokes cigarettes and cigars. Using alb inhaler; some relief. ? ?Social History  ? ?Tobacco Use  ?Smoking Status Some Days  ? Types: Cigarettes  ? Last attempt to quit:  06/24/2014  ? Years since quitting: 7.3  ?Smokeless Tobacco Never  ? ? ?OBJECTIVE: ? ?Vitals:  ? 11/11/21 1337  ?BP: 140/84  ?Pulse: (!) 119  ?Resp: 18  ?Temp: 99.9 ?F (37.7 ?C)  ?TempSrc: Oral  ?SpO2: 96%  ?  ?Tachycardia noted. ? ?General appearance: alert; NAD ?HEENT: [place]; AT; with nasal congestion ?Neck: supple without LAD ?Cv: RRR without murmer ?Lungs: unlabored respirations, moderate bilateral expiratory wheezing; cough: is present; no significant respiratory distress ?Skin: warm and dry ?Psychological: alert and cooperative; normal mood and affect ? ?Imaging: ?DG Chest 2 View ? ?Result Date: 11/11/2021 ?CLINICAL DATA:  Persistent cough, with shortness of breath and congestion. EXAM: CHEST - 2 VIEW COMPARISON:  No comparison prior imaging available at time dictation. FINDINGS: The heart size and mediastinal contours are within normal limits. Mild diffuse bronchial wall thickening. No focal airspace consolidation. No pleural effusion or pneumothorax. The visualized skeletal structures are unremarkable. IMPRESSION: Mild diffuse bronchial wall thickening may reflect bronchitis. No focal airspace consolidation. Electronically Signed   By: Maudry Mayhew M.D.   On: 11/11/2021 13:59  ? ? ?Allergies  ?Allergen Reactions  ? Allegra [Fexofenadine Hcl]   ?  Swelling occurs  ? Sulfa Antibiotics   ? Aspirin Rash  ?  Baby asprin; as a child, rash from dyes   ? ? ?Past Medical History:  ?Diagnosis Date  ? Asthma   ?  HPV in female   ? Migraine   ? Polycystic ovary syndrome   ? Thyroid disease   ? ?Family History  ?Problem Relation Age of Onset  ? Hyperlipidemia Mother   ? Hypertension Mother   ? Diabetes Mother   ? Hypertension Sister   ? Cancer Father   ?     lung tumor/ lung removed  ? Breast cancer Paternal Aunt   ? Breast cancer Paternal Grandmother   ? ?Social History  ? ?Socioeconomic History  ? Marital status: Single  ?  Spouse name: Not on file  ? Number of children: Not on file  ? Years of education: Not on file  ?  Highest education level: Not on file  ?Occupational History  ? Not on file  ?Tobacco Use  ? Smoking status: Some Days  ?  Types: Cigarettes  ?  Last attempt to quit: 06/24/2014  ?  Years since quitting: 7.3  ? Smokeless tobacco: Never  ?Substance and Sexual Activity  ? Alcohol use: Yes  ?  Alcohol/week: 0.0 standard drinks  ?  Comment: ocassional  ? Drug use: No  ? Sexual activity: Yes  ?  Birth control/protection: Pill  ?Other Topics Concern  ? Not on file  ?Social History Narrative  ? Not on file  ? ?Social Determinants of Health  ? ?Financial Resource Strain: Not on file  ?Food Insecurity: Not on file  ?Transportation Needs: Not on file  ?Physical Activity: Not on file  ?Stress: Not on file  ?Social Connections: Not on file  ?Intimate Partner Violence: Not on file  ? ? ? ? ? ? ? ? ? ?  ?Mardella Layman, MD ?11/11/21 1431 ? ?  ?Mardella Layman, MD ?11/11/21 1539 ? ?  ?Mardella Layman, MD ?11/11/21 1540 ? ?  ?Mardella Layman, MD ?11/11/21 1540 ? ?

## 2021-12-30 ENCOUNTER — Encounter: Payer: Self-pay | Admitting: Adult Health

## 2021-12-31 ENCOUNTER — Encounter: Payer: Self-pay | Admitting: Adult Health

## 2022-01-05 ENCOUNTER — Encounter: Payer: Self-pay | Admitting: Adult Health

## 2022-01-05 ENCOUNTER — Ambulatory Visit: Payer: 59 | Admitting: Adult Health

## 2022-01-05 VITALS — BP 100/72 | HR 84 | Temp 98.6°F | Ht 69.0 in | Wt 192.0 lb

## 2022-01-05 DIAGNOSIS — F329 Major depressive disorder, single episode, unspecified: Secondary | ICD-10-CM

## 2022-01-05 DIAGNOSIS — F1010 Alcohol abuse, uncomplicated: Secondary | ICD-10-CM | POA: Diagnosis not present

## 2022-01-05 DIAGNOSIS — E890 Postprocedural hypothyroidism: Secondary | ICD-10-CM

## 2022-01-05 DIAGNOSIS — Z720 Tobacco use: Secondary | ICD-10-CM | POA: Diagnosis not present

## 2022-01-05 LAB — TSH: TSH: 2.18 u[IU]/mL (ref 0.35–5.50)

## 2022-01-05 LAB — T4, FREE: Free T4: 1.13 ng/dL (ref 0.60–1.60)

## 2022-01-05 MED ORDER — BUPROPION HCL ER (SR) 150 MG PO TB12: 150.0000 mg | ORAL_TABLET | Freq: Two times a day (BID) | ORAL | 0 refills | Status: DC

## 2022-01-05 NOTE — Progress Notes (Signed)
Subjective:    Patient ID: Danielle [name], female    DOB: 01/31/1976, 46 y.o.   MRN: [id number]  HPI  46 year old female who  has a past medical history of Asthma, HPV in female, Migraine, Polycystic ovary syndrome, and Thyroid disease.  She presents to the office today with multiple complaints.   Tobacco Use - she would like to quit smoking. In the past she has used Wellbutrin and found this beneficial. She would like to go back on this medication. Smoking about 5-6 cigarettes per ay.   Depression - Feels like depression has gotten worse. She contributes this to increased in alcohol consumption, work, and life stressors.   Review of Systems See HPI   Past Medical History:  Diagnosis Date   Asthma    HPV in female    Migraine    Polycystic ovary syndrome    Thyroid disease     Social History   Socioeconomic History   Marital status: Single    Spouse name: Not on file   Number of children: Not on file   Years of education: Not on file   Highest education level: Not on file  Occupational History   Not on file  Tobacco Use   Smoking status: Some Days    Types: Cigarettes    Last attempt to quit: 06/24/2014    Years since quitting: 7.5   Smokeless tobacco: Never  Substance and Sexual Activity   Alcohol use: Yes    Alcohol/week: 0.0 standard drinks of alcohol    Comment: ocassional   Drug use: No   Sexual activity: Yes    Birth control/protection: Pill  Other Topics Concern   Not on file  Social History Narrative   Not on file   Social Determinants of Health   Financial Resource Strain: Not on file  Food Insecurity: Not on file  Transportation Needs: Not on file  Physical Activity: Not on file  Stress: Not on file  Social Connections: Not on file  Intimate Partner Violence: Not on file    Past Surgical History:  Procedure Laterality Date   cyro      Family History  Problem Relation Age of Onset   Hyperlipidemia Mother    Hypertension Mother     Diabetes Mother    Hypertension Sister    Cancer Father        lung tumor/ lung removed   Breast cancer Paternal Aunt    Breast cancer Paternal Grandmother     Allergies  Allergen Reactions   Allegra [Fexofenadine Hcl]     Swelling occurs   Sulfa Antibiotics    Aspirin Rash    Baby asprin; as a child, rash from dyes     Current Outpatient Medications on File Prior to Visit  Medication Sig Dispense Refill   albuterol (VENTOLIN HFA) 108 (90 Base) MCG/ACT inhaler Inhale 1-2 puffs into the lungs every 6 (six) hours as needed for wheezing or shortness of breath. 1 each 1   cyclobenzaprine (FLEXERIL) 10 MG tablet Take 10 mg by mouth 2 (two) times daily as needed.     fluticasone (FLONASE) 50 MCG/ACT nasal spray Place 2 sprays into both nostrils daily. 16 g 0   levothyroxine (SYNTHROID) 137 MCG tablet Take 1 tablet (137 mcg total) by mouth daily. 45 tablet 3   metroNIDAZOLE (FLAGYL) 500 MG tablet Take 1 tablet (500 mg total) by mouth 2 (two) times daily. 14 tablet 0   naproxen (NAPROSYN) 500 MG  tablet Take 1 tablet (500 mg total) by mouth 2 (two) times daily with a meal. 30 tablet 3   predniSONE (DELTASONE) 20 MG tablet Take 2 tablets (40 mg total) by mouth daily. 10 tablet 0   promethazine-dextromethorphan (PROMETHAZINE-DM) 6.25-15 MG/5ML syrup Take 5 mLs by mouth 4 (four) times daily as needed for cough. 118 mL 0   spironolactone (ALDACTONE) 50 MG tablet Take 1 tablet (50 mg total) by mouth 2 (two) times daily. 180 tablet 3   valACYclovir (VALTREX) 500 MG tablet TAKE 1 CAPLET BY MOUTH TWICE DAILY AS NEEDED     No current facility-administered medications on file prior to visit.    BP 100/72   Pulse 84   Temp 98.6 F (37 C) (Oral)   Ht 5\' 9"  (1.753 m)   Wt 192 lb (87.1 kg)   SpO2 98%   BMI 28.35 kg/m       Objective:   Physical Exam Vitals and nursing note reviewed.  Constitutional:      Appearance: Normal appearance.  Cardiovascular:     Rate and Rhythm: Normal rate  and regular rhythm.     Pulses: Normal pulses.     Heart sounds: Normal heart sounds.  Pulmonary:     Effort: Pulmonary effort is normal.     Breath sounds: Normal breath sounds.  Skin:    General: Skin is warm and dry.     Capillary Refill: Capillary refill takes less than 2 seconds.  Neurological:     General: No focal deficit present.     Mental Status: She is alert and oriented to person, place, and time.  Psychiatric:        Mood and Affect: Mood normal.        Behavior: Behavior normal.        Thought Content: Thought content normal.        Judgment: Judgment normal.       Assessment & Plan:  1. Tobacco use  - buPROPion (WELLBUTRIN SR) 150 MG 12 hr tablet; Take 1 tablet (150 mg total) by mouth 2 (two) times daily.  Dispense: 180 tablet; Refill: 0  2. Reactive depression -  Flowsheet Row Office Visit from 01/05/2022 in [facility] Total Score 19      - buPROPion (WELLBUTRIN SR) 150 MG 12 hr tablet; Take 1 tablet (150 mg total) by mouth 2 (two) times daily.  Dispense: 180 tablet; Refill: 0 - Follow up in one month or sooner if needed  3. Alcohol abuse - Encouraged to cut back on alcohol consumption. Hopefully the Wellbutrin will help with her need to rink   Time of total for care on the day of the encounter 30 minutes. This includes time spent in both face-to-face and non-face-to-face activities including preparing for the visit, reviewing the chart, time spent with patient discussing alcohol abuse, tobacco use, and depression.

## 2022-01-05 NOTE — Patient Instructions (Addendum)
It was great seeing you today   I have sent in wellbutrin   Lets follow up in 1 month for your physical exam

## 2022-01-13 ENCOUNTER — Encounter: Payer: Self-pay | Admitting: Adult Health

## 2022-01-14 ENCOUNTER — Encounter: Payer: Self-pay | Admitting: Internal Medicine

## 2022-01-14 DIAGNOSIS — E890 Postprocedural hypothyroidism: Secondary | ICD-10-CM

## 2022-01-14 NOTE — Telephone Encounter (Signed)
Noted  

## 2022-01-15 ENCOUNTER — Encounter: Payer: Self-pay | Admitting: Adult Health

## 2022-01-15 MED ORDER — SPIRONOLACTONE 50 MG PO TABS: 50.0000 mg | ORAL_TABLET | Freq: Two times a day (BID) | ORAL | 3 refills | Status: DC

## 2022-01-20 ENCOUNTER — Other Ambulatory Visit: Payer: Self-pay | Admitting: Adult Health

## 2022-01-20 DIAGNOSIS — F329 Major depressive disorder, single episode, unspecified: Secondary | ICD-10-CM

## 2022-01-20 DIAGNOSIS — Z720 Tobacco use: Secondary | ICD-10-CM

## 2022-02-02 ENCOUNTER — Telehealth: Payer: Self-pay

## 2022-02-02 NOTE — Telephone Encounter (Signed)
Pt called into the office and stated she has been on buPropion 150 mg twice a day for 3 months now and has had horrible sleep, she would like to know if she can take 300 mg once a day vs 2 times a day

## 2022-02-02 NOTE — Telephone Encounter (Signed)
Patient notified of update  and verbalized understanding. 

## 2022-02-05 ENCOUNTER — Ambulatory Visit (INDEPENDENT_AMBULATORY_CARE_PROVIDER_SITE_OTHER): Payer: 59 | Admitting: Adult Health

## 2022-02-05 ENCOUNTER — Encounter: Payer: Self-pay | Admitting: Adult Health

## 2022-02-05 VITALS — BP 110/60 | HR 88 | Temp 98.8°F | Ht 69.75 in | Wt 190.0 lb

## 2022-02-05 DIAGNOSIS — G4733 Obstructive sleep apnea (adult) (pediatric): Secondary | ICD-10-CM

## 2022-02-05 DIAGNOSIS — Z720 Tobacco use: Secondary | ICD-10-CM | POA: Diagnosis not present

## 2022-02-05 DIAGNOSIS — F329 Major depressive disorder, single episode, unspecified: Secondary | ICD-10-CM

## 2022-02-05 DIAGNOSIS — Z1159 Encounter for screening for other viral diseases: Secondary | ICD-10-CM

## 2022-02-05 DIAGNOSIS — Z0001 Encounter for general adult medical examination with abnormal findings: Secondary | ICD-10-CM | POA: Diagnosis not present

## 2022-02-05 DIAGNOSIS — E282 Polycystic ovarian syndrome: Secondary | ICD-10-CM

## 2022-02-05 DIAGNOSIS — E890 Postprocedural hypothyroidism: Secondary | ICD-10-CM

## 2022-02-05 DIAGNOSIS — Z1211 Encounter for screening for malignant neoplasm of colon: Secondary | ICD-10-CM

## 2022-02-05 LAB — COMPREHENSIVE METABOLIC PANEL
ALT: 22 U/L (ref 0–35)
AST: 16 U/L (ref 0–37)
Albumin: 4.5 g/dL (ref 3.5–5.2)
Alkaline Phosphatase: 40 U/L (ref 39–117)
BUN: 12 mg/dL (ref 6–23)
CO2: 22 mEq/L (ref 19–32)
Calcium: 9.8 mg/dL (ref 8.4–10.5)
Chloride: 103 mEq/L (ref 96–112)
Creatinine, Ser: 1.1 mg/dL (ref 0.40–1.20)
GFR: 60.71 mL/min (ref >60.00)
Glucose, Bld: 138 mg/dL — ABNORMAL HIGH (ref 70–99)
Potassium: 4.4 mEq/L (ref 3.5–5.1)
Sodium: 135 mEq/L (ref 135–145)
Total Bilirubin: 0.5 mg/dL (ref 0.2–1.2)
Total Protein: 7.8 g/dL (ref 6.0–8.3)

## 2022-02-05 LAB — LIPID PANEL
Cholesterol: 284 mg/dL — ABNORMAL HIGH (ref 0–200)
HDL: 47 mg/dL (ref >39.00)
LDL Cholesterol: 211 mg/dL — ABNORMAL HIGH (ref 0–99)
NonHDL: 237.47
Total CHOL/HDL Ratio: 6
Triglycerides: 134 mg/dL (ref 0.0–149.0)
VLDL: 26.8 mg/dL (ref 0.0–40.0)

## 2022-02-05 LAB — CBC WITH DIFFERENTIAL/PLATELET
Basophils Absolute: 0 10*3/uL (ref 0.0–0.1)
Basophils Relative: 0.6 % (ref 0.0–3.0)
Eosinophils Absolute: 0.1 10*3/uL (ref 0.0–0.7)
Eosinophils Relative: 2.8 % (ref 0.0–5.0)
HCT: 39.5 % (ref 36.0–46.0)
Hemoglobin: 12.9 g/dL (ref 12.0–15.0)
Lymphocytes Relative: 36.1 % (ref 12.0–46.0)
Lymphs Abs: 1.8 10*3/uL (ref 0.7–4.0)
MCHC: 32.7 g/dL (ref 30.0–36.0)
MCV: 89.7 fl (ref 78.0–100.0)
Monocytes Absolute: 0.6 10*3/uL (ref 0.1–1.0)
Monocytes Relative: 11.4 % (ref 3.0–12.0)
Neutro Abs: 2.4 10*3/uL (ref 1.4–7.7)
Neutrophils Relative %: 49.1 % (ref 43.0–77.0)
Platelets: 240 10*3/uL (ref 150.0–400.0)
RBC: 4.41 Mil/uL (ref 3.87–5.11)
RDW: 16.6 % — ABNORMAL HIGH (ref 11.5–15.5)
WBC: 4.9 10*3/uL (ref 4.0–10.5)

## 2022-02-05 LAB — HEMOGLOBIN A1C: Hgb A1c MFr Bld: 7 % — ABNORMAL HIGH (ref 4.6–6.5)

## 2022-02-05 LAB — TSH: TSH: 9.16 u[IU]/mL — ABNORMAL HIGH (ref 0.35–5.50)

## 2022-02-05 NOTE — Progress Notes (Signed)
Subjective:    Patient ID: Danielle [name], female    DOB: 05-11-1976, 46 y.o.   MRN: [id number]  HPI Patient presents for yearly preventative medicine examination. She is a pleasant 46 year old female who  has a past medical history of Asthma, HPV in female, Migraine, Polycystic ovary syndrome, and Thyroid disease.  Hypothyroidism-managed by endocrinology.  Currently prescribed Synthroid 137 mcg daily.  Lab Results  Component Value Date   TSH 2.18 01/05/2022    PCOS -currently prescribed spironolactone 25 mg daily.  Depression -was restarted on her in the 150 mg about a month ago after worsening depression that she contributed to alcohol consumption, work, and life stressors.. She reports that since starting wellbutrin she feels as though her depression has improved greatly.  Tobacco Use -currently managed with Wellbutrin 150 mg twice daily.  In the past she has found Wellbutrin to be helpful in helping her quit smoking.  When she was seen about a month ago she was smoking 5 to 6 cigarettes/day. Since starting wellbutrin she has only had 5-6 cigarettes in the month  Sleep disturbance - She reports that she has trouble sleeping, she snores, wakes up feeling fatigued and feels like she could take a nap in the middle of the afternoon. She did have a sleep study in 2018 showed mild to moderate OSA with 15 events per hour.  It was recommended that she had a CPAP, but she never went through with this.  She would like a referral back to have another sleep study done.  All immunizations and health maintenance protocols were reviewed with the patient and needed orders were placed.  Appropriate screening laboratory values were ordered for the patient including screening of hyperlipidemia, renal function and hepatic function.  Medication reconciliation,  past medical history, social history, problem list and allergies were reviewed in detail with the patient  Goals were established with  regard to weight loss, exercise, and  diet in compliance with medications  Wt Readings from Last 3 Encounters:  02/05/22 190 lb (86.2 kg)  01/05/22 192 lb (87.1 kg)  09/29/21 198 lb (89.8 kg)    Review of Systems  Constitutional: Negative.   HENT: Negative.    Eyes: Negative.   Respiratory: Negative.    Cardiovascular: Negative.   Gastrointestinal: Negative.   Endocrine: Negative.   Genitourinary: Negative.   Musculoskeletal: Negative.   Skin: Negative.   Allergic/Immunologic: Negative.   Neurological: Negative.   Hematological: Negative.   Psychiatric/Behavioral: Negative.     Past Medical History:  Diagnosis Date   Asthma    HPV in female    Migraine    Polycystic ovary syndrome    Thyroid disease     Social History   Socioeconomic History   Marital status: Single    Spouse name: Not on file   Number of children: Not on file   Years of education: Not on file   Highest education level: Not on file  Occupational History   Not on file  Tobacco Use   Smoking status: Some Days    Types: Cigarettes    Last attempt to quit: 06/24/2014    Years since quitting: 7.6   Smokeless tobacco: Never  Substance and Sexual Activity   Alcohol use: Yes    Alcohol/week: 0.0 standard drinks of alcohol    Comment: ocassional   Drug use: No   Sexual activity: Yes    Birth control/protection: Pill  Other Topics Concern   Not on file  Social History Narrative   Not on file   Social Determinants of Health   Financial Resource Strain: Not on file  Food Insecurity: Not on file  Transportation Needs: Not on file  Physical Activity: Not on file  Stress: Not on file  Social Connections: Not on file  Intimate Partner Violence: Not on file    Past Surgical History:  Procedure Laterality Date   cyro      Family History  Problem Relation Age of Onset   Hyperlipidemia Mother    Hypertension Mother    Diabetes Mother    Hypertension Sister    Cancer Father        lung  tumor/ lung removed   Breast cancer Paternal Aunt    Breast cancer Paternal Grandmother     Allergies  Allergen Reactions   Allegra [Fexofenadine Hcl]     Swelling occurs   Sulfa Antibiotics    Aspirin Rash    Baby asprin; as a child, rash from dyes     Current Outpatient Medications on File Prior to Visit  Medication Sig Dispense Refill   albuterol (VENTOLIN HFA) 108 (90 Base) MCG/ACT inhaler Inhale 1-2 puffs into the lungs every 6 (six) hours as needed for wheezing or shortness of breath. 1 each 1   buPROPion (WELLBUTRIN SR) 150 MG 12 hr tablet Take 1 tablet by mouth twice daily 180 tablet 0   cyclobenzaprine (FLEXERIL) 10 MG tablet Take 10 mg by mouth 2 (two) times daily as needed.     fluticasone (FLONASE) 50 MCG/ACT nasal spray Place 2 sprays into both nostrils daily. 16 g 0   levothyroxine (SYNTHROID) 137 MCG tablet Take 1 tablet (137 mcg total) by mouth daily. 45 tablet 3   naproxen (NAPROSYN) 500 MG tablet Take 1 tablet (500 mg total) by mouth 2 (two) times daily with a meal. 30 tablet 3   norethindrone (AYGESTIN) 5 MG tablet TAKE 3 TABLETS BY MOUTH DAILY FOR 1 WEEK THEN TAKE 2 TABLETS BY MOUTH DAILY FOR 1 WEEK THEN TAKE ONE TABLET BY MOUTH DAILY FOR 1 WEEK     spironolactone (ALDACTONE) 50 MG tablet Take 1 tablet (50 mg total) by mouth 2 (two) times daily. 180 tablet 3   valACYclovir (VALTREX) 500 MG tablet TAKE 1 CAPLET BY MOUTH TWICE DAILY AS NEEDED     No current facility-administered medications on file prior to visit.    BP 110/60   Pulse 88   Temp 98.8 F (37.1 C) (Oral)   Ht 5' 9.75" (1.772 m)   Wt 190 lb (86.2 kg)   SpO2 98%   BMI 27.46 kg/m       Objective:   Physical Exam Vitals and nursing note reviewed.  Constitutional:      General: She is not in acute distress.    Appearance: Normal appearance. She is well-developed. She is not ill-appearing.  HENT:     Head: Normocephalic and atraumatic.     Right Ear: Tympanic membrane, ear canal and external  ear normal. There is no impacted cerumen.     Left Ear: Tympanic membrane, ear canal and external ear normal. There is no impacted cerumen.     Nose: Nose normal. No congestion or rhinorrhea.     Mouth/Throat:     Mouth: Mucous membranes are moist.     Pharynx: Oropharynx is clear. No oropharyngeal exudate or posterior oropharyngeal erythema.  Eyes:     General:        Right eye: No  discharge.        Left eye: No discharge.     Extraocular Movements: Extraocular movements intact.     Conjunctiva/sclera: Conjunctivae normal.     Pupils: Pupils are equal, round, and reactive to light.  Neck:     Thyroid: No thyromegaly.     Vascular: No carotid bruit.     Trachea: No tracheal deviation.  Cardiovascular:     Rate and Rhythm: Normal rate and regular rhythm.     Pulses: Normal pulses.     Heart sounds: Normal heart sounds. No murmur heard.    No friction rub. No gallop.  Pulmonary:     Effort: Pulmonary effort is normal. No respiratory distress.     Breath sounds: Normal breath sounds. No stridor. No wheezing, rhonchi or rales.  Chest:     Chest wall: No tenderness.  Abdominal:     General: Abdomen is flat. Bowel sounds are normal. There is no distension.     Palpations: Abdomen is soft. There is no mass.     Tenderness: There is no abdominal tenderness. There is no right CVA tenderness, left CVA tenderness, guarding or rebound.     Hernia: No hernia is present.  Musculoskeletal:        General: No swelling, tenderness, deformity or signs of injury. Normal range of motion.     Cervical back: Normal range of motion and neck supple.     Right lower leg: No edema.     Left lower leg: No edema.  Lymphadenopathy:     Cervical: No cervical adenopathy.  Skin:    General: Skin is warm and dry.     Coloration: Skin is not jaundiced or pale.     Findings: No bruising, erythema, lesion or rash.  Neurological:     General: No focal deficit present.     Mental Status: She is alert and  oriented to person, place, and time.     Cranial Nerves: No cranial nerve deficit.     Sensory: No sensory deficit.     Motor: No weakness.     Coordination: Coordination normal.     Gait: Gait normal.     Deep Tendon Reflexes: Reflexes normal.  Psychiatric:        Mood and Affect: Mood normal.        Behavior: Behavior normal.        Thought Content: Thought content normal.        Judgment: Judgment normal.       Assessment & Plan:  1. Encounter for general adult medical examination with abnormal findings  - CBC with Differential/Platelet; Future - Comprehensive metabolic panel; Future - Hemoglobin A1c; Future - Lipid panel; Future - TSH; Future  2. Tobacco use - She has cut back. Was encouraged to quit completely  - CBC with Differential/Platelet; Future - Comprehensive metabolic panel; Future - Hemoglobin A1c; Future - Lipid panel; Future - TSH; Future  3. Reactive depression - Continue with wellbutrin  4. PCOS (polycystic ovarian syndrome) - Continue with spironology   5. Postablative hypothyroidism - Per endocrinology  - CBC with Differential/Platelet; Future - Comprehensive metabolic panel; Future - Hemoglobin A1c; Future - Lipid panel; Future - TSH; Future  6. Need for hepatitis C screening test  - Hep C Antibody; Future  7. Colon cancer screening  - Ambulatory referral to Gastroenterology  8. OSA (obstructive sleep apnea)  - Ambulatory referral to Pulmonology  Shirline Frees, NP

## 2022-02-05 NOTE — Patient Instructions (Signed)
It was great seeing you today   We will follow up with you regarding your lab work   Please let me know if you need anything   [facility]  Address: [address], [place], [place] [address] Phone: [phone]  Someone will call you to schedule your colonoscopy and sleep study

## 2022-02-08 ENCOUNTER — Encounter: Payer: Self-pay | Admitting: Adult Health

## 2022-02-08 ENCOUNTER — Encounter: Payer: Self-pay | Admitting: Internal Medicine

## 2022-02-08 DIAGNOSIS — E1169 Type 2 diabetes mellitus with other specified complication: Secondary | ICD-10-CM

## 2022-02-08 DIAGNOSIS — F321 Major depressive disorder, single episode, moderate: Secondary | ICD-10-CM

## 2022-02-08 LAB — HEPATITIS C ANTIBODY: Hepatitis C Ab: NONREACTIVE

## 2022-02-09 MED ORDER — LEVOTHYROXINE SODIUM 137 MCG PO TABS: 125.0000 ug | ORAL_TABLET | ORAL | 3 refills | Status: DC

## 2022-02-10 ENCOUNTER — Other Ambulatory Visit: Payer: Self-pay | Admitting: Adult Health

## 2022-02-10 MED ORDER — METFORMIN HCL 500 MG PO TABS: 500.0000 mg | ORAL_TABLET | Freq: Two times a day (BID) | ORAL | 1 refills | Status: DC

## 2022-02-10 NOTE — Addendum Note (Signed)
Addended by: Waymon Amato R on: 02/10/2022 03:39 PM   Modules accepted: Orders

## 2022-02-10 NOTE — Addendum Note (Signed)
Addended by: Nancy Fetter on: 02/10/2022 04:03 PM   Modules accepted: Orders

## 2022-02-10 NOTE — Addendum Note (Signed)
Addended by: Waymon Amato R on: 02/10/2022 01:29 PM   Modules accepted: Orders

## 2022-02-11 NOTE — Telephone Encounter (Signed)
FYI

## 2022-02-12 ENCOUNTER — Telehealth (INDEPENDENT_AMBULATORY_CARE_PROVIDER_SITE_OTHER): Payer: 59 | Admitting: Adult Health

## 2022-02-12 ENCOUNTER — Encounter: Payer: Self-pay | Admitting: Adult Health

## 2022-02-12 VITALS — Ht 69.75 in | Wt 190.0 lb

## 2022-02-12 DIAGNOSIS — E1169 Type 2 diabetes mellitus with other specified complication: Secondary | ICD-10-CM

## 2022-02-12 DIAGNOSIS — E785 Hyperlipidemia, unspecified: Secondary | ICD-10-CM | POA: Diagnosis not present

## 2022-02-12 MED ORDER — FREESTYLE LIBRE 3 SENSOR MISC: 1.0000 | 2 refills | Status: DC

## 2022-02-12 NOTE — Progress Notes (Addendum)
Virtual Visit via Video Note  I connected with Danielle [name]  on 02/12/22 at  7:45 AM EDT by a video enabled telemedicine application and verified that I am speaking with the correct person using two identifiers.  Location patient: home Location provider:work or home office Persons participating in the virtual visit: patient, provider  I discussed the limitations of evaluation and management by telemedicine and the availability of in person appointments. The patient expressed understanding and agreed to proceed.   HPI: 46 year old female who is a newly diagnosed diabetic was started on metformin well has atorvastatin for her hyperlipidemia.  She read through side effects of her medications and is worried about starting these new medications.  She is wanting to know if she changes her lifestyle start living healthier if she will be able to come off her diabetes and cholesterol medications.  She is interested in the [facility] system to help manage her diabetes    ROS: See pertinent positives and negatives per HPI.  Past Medical History:  Diagnosis Date   Asthma    HPV in female    Migraine    Polycystic ovary syndrome    Thyroid disease     Past Surgical History:  Procedure Laterality Date   cyro      Family History  Problem Relation Age of Onset   Hyperlipidemia Mother    Hypertension Mother    Diabetes Mother    Hypertension Sister    Cancer Father        lung tumor/ lung removed   Breast cancer Paternal Aunt    Breast cancer Paternal Grandmother        Current Outpatient Medications:    albuterol (VENTOLIN HFA) 108 (90 Base) MCG/ACT inhaler, Inhale 1-2 puffs into the lungs every 6 (six) hours as needed for wheezing or shortness of breath., Disp: 1 each, Rfl: 1   buPROPion (WELLBUTRIN SR) 150 MG 12 hr tablet, Take 1 tablet by mouth twice daily, Disp: 180 tablet, Rfl: 0   cyclobenzaprine (FLEXERIL) 10 MG tablet, Take 10 mg by mouth 2 (two) times daily as needed., Disp: ,  Rfl:    fluticasone (FLONASE) 50 MCG/ACT nasal spray, Place 2 sprays into both nostrils daily., Disp: 16 g, Rfl: 0   levothyroxine (SYNTHROID) 137 MCG tablet, Take 1 tablet (137 mcg total) by mouth as directed. Two tabs on Sundays and 1 tab rest of the week, Disp: 96 tablet, Rfl: 3   metFORMIN (GLUCOPHAGE) 500 MG tablet, Take 1 tablet (500 mg total) by mouth 2 (two) times daily with a meal., Disp: 180 tablet, Rfl: 1   naproxen (NAPROSYN) 500 MG tablet, Take 1 tablet (500 mg total) by mouth 2 (two) times daily with a meal., Disp: 30 tablet, Rfl: 3   norethindrone (AYGESTIN) 5 MG tablet, TAKE 3 TABLETS BY MOUTH DAILY FOR 1 WEEK THEN TAKE 2 TABLETS BY MOUTH DAILY FOR 1 WEEK THEN TAKE ONE TABLET BY MOUTH DAILY FOR 1 WEEK, Disp: , Rfl:    spironolactone (ALDACTONE) 50 MG tablet, Take 1 tablet (50 mg total) by mouth 2 (two) times daily., Disp: 180 tablet, Rfl: 3   valACYclovir (VALTREX) 500 MG tablet, TAKE 1 CAPLET BY MOUTH TWICE DAILY AS NEEDED, Disp: , Rfl:   EXAM:  VITALS per patient if applicable:  GENERAL: alert, oriented, appears well and in no acute distress  HEENT: atraumatic, conjunttiva clear, no obvious abnormalities on inspection of external nose and ears  NECK: normal movements of the head and neck  LUNGS: on inspection no signs of respiratory distress, breathing rate appears normal, no obvious gross SOB, gasping or wheezing  CV: no obvious cyanosis  MS: moves all visible extremities without noticeable abnormality  PSYCH/NEURO: pleasant and cooperative, no obvious depression or anxiety, speech and thought processing grossly intact  ASSESSMENT AND PLAN:  Discussed the following assessment and plan:  1. Type 2 diabetes mellitus with other specified complication, without long-term current use of insulin (HCC) -We discussed the medications in detail and reviewed more common side effects.  I did advise her that if she changes her lifestyle that she may be able to come off metformin  at some point in time but it would be recommended to continue with her cholesterol-lowering medication to help prevent future cardiovascular complications.  She will start her medications and inform me if she has any side effects. - Libre 3 sent in   2. Hyperlipidemia, unspecified hyperlipidemia type       I discussed the assessment and treatment plan with the patient. The patient was provided an opportunity to ask questions and all were answered. The patient agreed with the plan and demonstrated an understanding of the instructions.   The patient was advised to call back or seek an in-person evaluation if the symptoms worsen or if the condition fails to improve as anticipated.   Shirline Frees, NP

## 2022-02-12 NOTE — Addendum Note (Signed)
Addended by: Nancy Fetter on: 02/12/2022 09:55 AM   Modules accepted: Orders

## 2022-02-19 ENCOUNTER — Encounter: Payer: Self-pay | Admitting: Gastroenterology

## 2022-02-25 ENCOUNTER — Encounter: Payer: Self-pay | Admitting: Adult Health

## 2022-02-25 ENCOUNTER — Encounter: Payer: Self-pay | Admitting: Internal Medicine

## 2022-02-26 NOTE — Telephone Encounter (Signed)
Please advise 

## 2022-03-02 ENCOUNTER — Emergency Department (HOSPITAL_BASED_OUTPATIENT_CLINIC_OR_DEPARTMENT_OTHER)
Admission: EM | Admit: 2022-03-02 | Discharge: 2022-03-02 | Disposition: A | Discharge status: Home / Self Care | Payer: 59 | Attending: Emergency Medicine | Admitting: Emergency Medicine

## 2022-03-02 ENCOUNTER — Other Ambulatory Visit: Payer: Self-pay

## 2022-03-02 ENCOUNTER — Encounter (HOSPITAL_BASED_OUTPATIENT_CLINIC_OR_DEPARTMENT_OTHER): Payer: Self-pay | Admitting: Pediatrics

## 2022-03-02 DIAGNOSIS — N921 Excessive and frequent menstruation with irregular cycle: Secondary | ICD-10-CM | POA: Diagnosis not present

## 2022-03-02 DIAGNOSIS — N939 Abnormal uterine and vaginal bleeding, unspecified: Secondary | ICD-10-CM | POA: Diagnosis present

## 2022-03-02 LAB — CBC WITH DIFFERENTIAL/PLATELET
Abs Immature Granulocytes: 0.01 10*3/uL (ref 0.00–0.07)
Basophils Absolute: 0 10*3/uL (ref 0.0–0.1)
Basophils Relative: 1 %
Eosinophils Absolute: 0.2 10*3/uL (ref 0.0–0.5)
Eosinophils Relative: 5 %
HCT: 33.5 % — ABNORMAL LOW (ref 36.0–46.0)
Hemoglobin: 11.2 g/dL — ABNORMAL LOW (ref 12.0–15.0)
Immature Granulocytes: 0 %
Lymphocytes Relative: 37 %
Lymphs Abs: 1.6 10*3/uL (ref 0.7–4.0)
MCH: 29.2 pg (ref 26.0–34.0)
MCHC: 33.4 g/dL (ref 30.0–36.0)
MCV: 87.2 fL (ref 80.0–100.0)
Monocytes Absolute: 0.4 10*3/uL (ref 0.1–1.0)
Monocytes Relative: 10 %
Neutro Abs: 2.1 10*3/uL (ref 1.7–7.7)
Neutrophils Relative %: 47 %
Platelets: 311 10*3/uL (ref 150–400)
RBC: 3.84 MIL/uL — ABNORMAL LOW (ref 3.87–5.11)
RDW: 14.8 % (ref 11.5–15.5)
WBC: 4.3 10*3/uL (ref 4.0–10.5)
nRBC: 0 % (ref 0.0–0.2)

## 2022-03-02 LAB — BASIC METABOLIC PANEL
Anion gap: 8 (ref 5–15)
BUN: 13 mg/dL (ref 6–20)
CO2: 17 mmol/L — ABNORMAL LOW (ref 22–32)
Calcium: 8.9 mg/dL (ref 8.9–10.3)
Chloride: 110 mmol/L (ref 98–111)
Creatinine, Ser: 0.94 mg/dL (ref 0.44–1.00)
GFR, Estimated: >60 mL/min (ref >60)
Glucose, Bld: 177 mg/dL — ABNORMAL HIGH (ref 70–99)
Potassium: 3.6 mmol/L (ref 3.5–5.1)
Sodium: 135 mmol/L (ref 135–145)

## 2022-03-02 LAB — PREGNANCY, URINE: Preg Test, Ur: NEGATIVE

## 2022-03-02 MED ORDER — MEGESTROL ACETATE 20 MG PO TABS: 40.0000 mg | ORAL_TABLET | Freq: Every day | ORAL | 0 refills | Status: AC

## 2022-03-02 NOTE — ED Notes (Signed)
Discharge recommendations and medications reviewed with pt. States understanding. Ambulatory upon discharge

## 2022-03-02 NOTE — ED Provider Notes (Signed)
[facility] EMERGENCY DEPARTMENT Provider Note   CSN: [id number] Arrival date & time: 03/02/22  0957     History {Add pertinent medical, surgical, social history, OB history to HPI:1} Chief Complaint  Patient presents with   Vaginal Bleeding    Danielle [name] is a 46 y.o. female.  HPI     Was having menses in April, was placed on norethindrone, then medication was confusing way it was written, off schedule, then started heavy bleeding on and off insce end of July, was told to go to the hospital 3 weeks ago, has been every day except 2 days since the end of July.  Using 12 hour overnight pads in daytime, will go through 4-5 of those, changing every 3-4 hours. Cramping pain comes and goes.  No urinary symptoms, no nausea and vomiting.  Not nauseas now but did have weeks ago.  Fatigue.  A little lightheaded. No syncope. Diarrhe alast 1.5 days, having constipation prior to that.  Started low estrogen OCP yesterday. Norethindrone    Dr. Moishe Spice, had stopped it and then got back on it, has been taking it as prescribed since about 3 weeks ago or 8/9 [name] Medications Prior to Admission medications   Medication Sig Start Date End Date Taking? Authorizing Provider  albuterol (VENTOLIN HFA) 108 (90 Base) MCG/ACT inhaler Inhale 1-2 puffs into the lungs every 6 (six) hours as needed for wheezing or shortness of breath. 11/11/21   Mardella Layman, MD  buPROPion [facility] SR) 150 MG 12 hr tablet Take 1 tablet by mouth twice daily 01/21/22   Nafziger, Kandee Keen, NP  Continuous Blood Gluc Sensor (FREESTYLE LIBRE 3 SENSOR) MISC 1 Device by Does not apply route every 14 (fourteen) days. Place 1 sensor on the skin every 14 days. Use to check glucose continuously 02/12/22   Nafziger, Kandee Keen, NP  cyclobenzaprine (FLEXERIL) 10 MG tablet Take 10 mg by mouth 2 (two) times daily as needed. 08/15/20   [provider]  fluticasone (FLONASE) 50 MCG/ACT nasal spray Place 2 sprays into both  nostrils daily. 08/28/21   Waldon Merl, PA-C  levothyroxine (SYNTHROID) 137 MCG tablet Take 1 tablet (137 mcg total) by mouth as directed. Two tabs on Sundays and 1 tab rest of the week 02/09/22   Carlus Pavlov, MD  metFORMIN (GLUCOPHAGE) 500 MG tablet Take 1 tablet (500 mg total) by mouth 2 (two) times daily with a meal. 02/10/22   Nafziger, Kandee Keen, NP  naproxen (NAPROSYN) 500 MG tablet Take 1 tablet (500 mg total) by mouth 2 (two) times daily with a meal. 09/02/20   Tarry Kos, MD  norethindrone (AYGESTIN) 5 MG tablet TAKE 3 TABLETS BY MOUTH DAILY FOR 1 WEEK THEN TAKE 2 TABLETS BY MOUTH DAILY FOR 1 WEEK THEN TAKE ONE TABLET BY MOUTH DAILY FOR 1 WEEK 01/21/22   [provider]  spironolactone (ALDACTONE) 50 MG tablet Take 1 tablet (50 mg total) by mouth 2 (two) times daily. 01/15/22   Carlus Pavlov, MD  valACYclovir (VALTREX) 500 MG tablet TAKE 1 CAPLET BY MOUTH TWICE DAILY AS NEEDED    [provider]      Allergies    Allegra [fexofenadine hcl], Sulfa antibiotics, and Aspirin    Review of Systems   Review of Systems  Physical Exam Updated Vital Signs BP 130/81 (BP Location: Left Arm)   Pulse 90   Temp 98 F (36.7 C) (Oral)   Resp 19   Ht 5' 9.5" (1.765 m)  Wt 86.2 kg   SpO2 100%   BMI 27.66 kg/m  Physical Exam  ED Results / Procedures / Treatments   Labs (all labs ordered are listed, but only abnormal results are displayed) Labs Reviewed  CBC WITH DIFFERENTIAL/PLATELET - Abnormal; Notable for the following components:      Result Value   RBC 3.84 (*)    Hemoglobin 11.2 (*)    HCT 33.5 (*)    All other components within normal limits  BASIC METABOLIC PANEL - Abnormal; Notable for the following components:   CO2 17 (*)    Glucose, Bld 177 (*)    All other components within normal limits  PREGNANCY, URINE    EKG None  Radiology No results found.  Procedures Procedures  {Document cardiac monitor, telemetry assessment procedure when  appropriate:1}  Medications Ordered in ED Medications - No data to display  ED Course/ Medical Decision Making/ A&P                           Medical Decision Making Amount and/or Complexity of Data Reviewed Labs: ordered.   ***  {Document critical care time when appropriate:1} {Document review of labs and clinical decision tools ie heart score, Chads2Vasc2 etc:1}  {Document your independent review of radiology images, and any outside records:1} {Document your discussion with family members, caretakers, and with consultants:1} {Document social determinants of health affecting pt's care:1} {Document your decision making why or why not admission, treatments were needed:1} Final Clinical Impression(s) / ED Diagnoses Final diagnoses:  None    Rx / DC Orders ED Discharge Orders     None

## 2022-03-02 NOTE — ED Triage Notes (Signed)
C/O heavy vaginal bleeding; ongoing issue since July; reported has had some  prescriptions from [facility] for norethindrone and initially had some issues with dosing; but was able to re-started and contacted the prescribing MD. Reports hx of PCOS and fibroids.

## 2022-03-03 ENCOUNTER — Encounter: Payer: Self-pay | Admitting: Adult Health

## 2022-03-03 ENCOUNTER — Ambulatory Visit: Payer: 59 | Admitting: Adult Health

## 2022-03-03 VITALS — BP 138/70 | HR 87 | Temp 98.4°F | Wt 184.0 lb

## 2022-03-03 DIAGNOSIS — F329 Major depressive disorder, single episode, unspecified: Secondary | ICD-10-CM | POA: Diagnosis not present

## 2022-03-03 DIAGNOSIS — E1169 Type 2 diabetes mellitus with other specified complication: Secondary | ICD-10-CM | POA: Diagnosis not present

## 2022-03-03 DIAGNOSIS — K5901 Slow transit constipation: Secondary | ICD-10-CM

## 2022-03-03 DIAGNOSIS — G4700 Insomnia, unspecified: Secondary | ICD-10-CM | POA: Diagnosis not present

## 2022-03-03 DIAGNOSIS — Z789 Other specified health status: Secondary | ICD-10-CM | POA: Diagnosis not present

## 2022-03-03 MED ORDER — BUPROPION HCL ER (XL) 150 MG PO TB24: 150.0000 mg | ORAL_TABLET | Freq: Every day | ORAL | 0 refills | Status: DC

## 2022-03-03 NOTE — Progress Notes (Signed)
Subjective:    Patient ID: Danielle [name], female    DOB: [birth year], 46 y.o.   MRN: [id number]  HPI 46 year old female who  has a past medical history of Asthma, HPV in female, Migraine, Polycystic ovary syndrome, and Thyroid disease.  She presents to the office today for multiple issues.  He reports that since starting Wellbutrin about 3 weeks ago she has been suffering from insomnia.  Currently prescribed Wellbutrin 150 mg twice daily.  She feels as though she has a hard time falling asleep and staying asleep.  She does feel as though her depression and mood disorder are better since starting Wellbutrin  She also reports constipation since she has started metformin, Wellbutrin, and decreased her alcohol consumption.  She may have 1 hard stool weekly or twice weekly.  She has recently started increasing her fiber supplement with Benefiber and drinking more water.  She is also working out 3-4 times a week at the gym.    Review of Systems See HPI   Past Medical History:  Diagnosis Date   Asthma    HPV in female    Migraine    Polycystic ovary syndrome    Thyroid disease     Social History   Socioeconomic History   Marital status: Single    Spouse name: Not on file   Number of children: Not on file   Years of education: Not on file   Highest education level: Not on file  Occupational History   Not on file  Tobacco Use   Smoking status: Some Days    Types: Cigarettes    Last attempt to quit: 06/24/2014    Years since quitting: 7.6   Smokeless tobacco: Never  Substance and Sexual Activity   Alcohol use: Yes    Alcohol/week: 0.0 standard drinks of alcohol    Comment: ocassional   Drug use: No   Sexual activity: Yes    Birth control/protection: Pill  Other Topics Concern   Not on file  Social History Narrative   Not on file   Social Determinants of Health   Financial Resource Strain: Not on file  Food Insecurity: Not on file  Transportation Needs: Not on  file  Physical Activity: Not on file  Stress: Not on file  Social Connections: Not on file  Intimate Partner Violence: Not on file    Past Surgical History:  Procedure Laterality Date   cyro      Family History  Problem Relation Age of Onset   Hyperlipidemia Mother    Hypertension Mother    Diabetes Mother    Hypertension Sister    Cancer Father        lung tumor/ lung removed   Breast cancer Paternal Aunt    Breast cancer Paternal Grandmother     Allergies  Allergen Reactions   Allegra [Fexofenadine Hcl]     Swelling occurs   Sulfa Antibiotics    Aspirin Rash    Baby asprin; as a child, rash from dyes     Current Outpatient Medications on File Prior to Visit  Medication Sig Dispense Refill   albuterol (VENTOLIN HFA) 108 (90 Base) MCG/ACT inhaler Inhale 1-2 puffs into the lungs every 6 (six) hours as needed for wheezing or shortness of breath. 1 each 1   buPROPion (WELLBUTRIN SR) 150 MG 12 hr tablet Take 1 tablet by mouth twice daily 180 tablet 0   fluticasone (FLONASE) 50 MCG/ACT nasal spray Place 2 sprays into both  nostrils daily. 16 g 0   levothyroxine (SYNTHROID) 137 MCG tablet Take 1 tablet (137 mcg total) by mouth as directed. Two tabs on Sundays and 1 tab rest of the week 96 tablet 3   megestrol (MEGACE) 20 MG tablet Take 2 tablets (40 mg total) by mouth daily for 5 days. 10 tablet 0   metFORMIN (GLUCOPHAGE) 500 MG tablet Take 1 tablet (500 mg total) by mouth 2 (two) times daily with a meal. 180 tablet 1   naproxen (NAPROSYN) 500 MG tablet Take 1 tablet (500 mg total) by mouth 2 (two) times daily with a meal. 30 tablet 3   spironolactone (ALDACTONE) 50 MG tablet Take 1 tablet (50 mg total) by mouth 2 (two) times daily. 180 tablet 3   valACYclovir (VALTREX) 500 MG tablet TAKE 1 CAPLET BY MOUTH TWICE DAILY AS NEEDED     Continuous Blood Gluc Sensor (FREESTYLE LIBRE 3 SENSOR) MISC 1 Device by Does not apply route every 14 (fourteen) days. Place 1 sensor on the skin  every 14 days. Use to check glucose continuously (Patient not taking: Reported on 03/03/2022) 2 each 2   cyclobenzaprine (FLEXERIL) 10 MG tablet Take 10 mg by mouth 2 (two) times daily as needed. (Patient not taking: Reported on 03/03/2022)     ELINEST 0.3-30 MG-MCG tablet Take 1 tablet by mouth daily. (Patient not taking: Reported on 03/03/2022)     No current facility-administered medications on file prior to visit.    BP 138/70 (BP Location: Right Arm, Patient Position: Sitting, Cuff Size: Normal)   Pulse 87   Temp 98.4 F (36.9 C) (Oral)   Wt 184 lb (83.5 kg)   SpO2 99%   BMI 26.78 kg/m       Objective:   Physical Exam Vitals and nursing note reviewed.  Constitutional:      Appearance: Normal appearance.  Cardiovascular:     Rate and Rhythm: Normal rate and regular rhythm.     Pulses: Normal pulses.     Heart sounds: Normal heart sounds.  Pulmonary:     Effort: Pulmonary effort is normal.     Breath sounds: Normal breath sounds.  Abdominal:     General: Abdomen is flat. Bowel sounds are normal. There is no distension.     Palpations: Abdomen is soft.     Tenderness: There is no abdominal tenderness.  Skin:    General: Skin is warm and dry.  Neurological:     General: No focal deficit present.     Mental Status: She is alert and oriented to person, place, and time.  Psychiatric:        Mood and Affect: Mood normal.        Behavior: Behavior normal.        Thought Content: Thought content normal.        Assessment & Plan:  1. Type 2 diabetes mellitus with other specified complication, without long-term current use of insulin (HCC) - Will have her decrease metformin to daily dosing since she is working on lifestyle modifcations   2. Reactive depression  - buPROPion (WELLBUTRIN XL) 150 MG 24 hr tablet; Take 1 tablet (150 mg total) by mouth daily.  Dispense: 90 tablet; Refill: 0  3. Alcohol use - Continue to cut back   4. Insomnia, unspecified type - Will trial  switching her to XL wellbutrin  - buPROPion (WELLBUTRIN XL) 150 MG 24 hr tablet; Take 1 tablet (150 mg total) by mouth daily.  Dispense: 90 tablet;  Refill: 0  5. Slow transit constipation - Should improve as she continues to cut back back on alcohol consumption and increasing fiber - Follow up as needed  Shirline Frees, NP

## 2022-03-03 NOTE — Patient Instructions (Signed)
It was great seeing you today   I am going to change your Wellbutrin to XR - take this daily   You can also only take your metformin 500 mg daily.

## 2022-03-04 ENCOUNTER — Encounter: Payer: Self-pay | Admitting: Internal Medicine

## 2022-03-05 ENCOUNTER — Ambulatory Visit (AMBULATORY_SURGERY_CENTER): Payer: Self-pay

## 2022-03-05 VITALS — Ht 69.75 in | Wt 183.0 lb

## 2022-03-05 DIAGNOSIS — Z1211 Encounter for screening for malignant neoplasm of colon: Secondary | ICD-10-CM

## 2022-03-05 MED ORDER — NA SULFATE-K SULFATE-MG SULF 17.5-3.13-1.6 GM/177ML PO SOLN: 1.0000 | Freq: Once | ORAL | 0 refills | Status: AC

## 2022-03-05 NOTE — Progress Notes (Signed)
No egg or soy allergy known to patient  No issues known to pt with past sedation with any surgeries or procedures Patient denies ever being told they had issues or difficulty with intubation  No FH of Malignant Hyperthermia Pt is not on diet pills Pt is not on home 02  Pt is not on blood thinners  Pt reports issues with constipation - patient reports she takes Benefiber on occasion ("when I remember to take it- I take it";)- reports she has increased po fluids, veggies/fruits, exercise; use Miralax daily for increase in bowel movements; No A fib or A flutter Have any cardiac testing pending--NO Pt instructed to use [email or web address] or GoodRx for a price reduction on prep  Insurance verified during PV appt=[employer]

## 2022-03-12 ENCOUNTER — Encounter: Payer: Self-pay | Admitting: Gastroenterology

## 2022-03-15 ENCOUNTER — Encounter: Payer: 59 | Attending: Adult Health | Admitting: Dietician

## 2022-03-15 ENCOUNTER — Encounter: Payer: Self-pay | Admitting: Dietician

## 2022-03-15 DIAGNOSIS — E119 Type 2 diabetes mellitus without complications: Secondary | ICD-10-CM | POA: Insufficient documentation

## 2022-03-15 NOTE — Progress Notes (Signed)
Diabetes Self-Management Education  Visit Type: First/Initial  Appt. Start Time: 1405 Appt. End Time: 1510  03/15/2022  [name], identified by name and date of birth, is a 46 y.o. female with a diagnosis of Diabetes: Type 2.   ASSESSMENT  Primary concern: pt wants to learn about how she should be eating for diabetes and the effects of alcohol on diabetes.   History includes: anxiety, asthma, diabetes mellitus, HLD, thyroid disease Labs noted: Chol 284, LDL 211, A1C 7.0 (02/05/22) Medications include: metformin Supplements: ferrous sulfate CGM: freestyle libre 3  Patient lives with her mom. Pt does shopping and cooking. Pt states Mom used to do cooking, but her mom would make high fat meals low in vegetables and so pt has now purchased her own fridge to keep her groceries in and does her own cooking. Pt states she has started to cook at home more than eating out, and states she has reduced her alcohol intake to 1-3 drinks 2x/wk.   Pt meets with a personal trainer 4x/wk for 60 minutes.  During visit pt stated she felt as though she was going low and states she began to feel shaky, loss of concentration and blurry vision. Pt's CGM read 79mg /dL but pt had been dropping from 250mg /dL high. This RD gave pt a juice box and explained why she may be feeling symptoms of a low and pt was stable upon leaving.   Height 5\' 9"  (1.753 m), weight 185 lb 1.6 oz (84 kg). Body mass index is 27.33 kg/m.   Diabetes Self-Management Education - 03/15/22 1411       Visit Information   Visit Type First/Initial      Initial Visit   Diabetes Type Type 2    Date Diagnosed 01/2022    Are you currently following a meal plan? No    Are you taking your medications as prescribed? Yes      Health Coping   How would you rate your overall health? Fair      Psychosocial Assessment   Patient Belief/Attitude about Diabetes Afraid    What is the hardest part about your diabetes right now, causing  you the most concern, or is the most worrisome to you about your diabetes?   Making healty food and beverage choices    Self-care barriers None    Self-management support Doctor's office    Other persons present Patient    Patient Concerns Nutrition/Meal planning    Special Needs None    Preferred Learning Style No preference indicated    Learning Readiness Ready    How often do you need to have someone help you when you read instructions, pamphlets, or other written materials from your doctor or pharmacy? 1 - Never    What is the last grade level you completed in school? graduate degree      Pre-Education Assessment   Patient understands the diabetes disease and treatment process. Needs Instruction    Patient understands incorporating nutritional management into lifestyle. Needs Instruction    Patient undertands incorporating physical activity into lifestyle. Needs Instruction    Patient understands using medications safely. Needs Instruction    Patient understands monitoring blood glucose, interpreting and using results Needs Instruction    Patient understands prevention, detection, and treatment of acute complications. Needs Instruction    Patient understands prevention, detection, and treatment of chronic complications. Needs Instruction    Patient understands how to develop strategies to address psychosocial issues. Needs Instruction    Patient  understands how to develop strategies to promote health/change behavior. Needs Instruction      Complications   Last HgB A1C per patient/outside source 7 %   02/05/22   How often do you check your blood sugar? > 4 times/day    Fasting Blood glucose range (mg/dL) 70-129    Postprandial Blood glucose range (mg/dL) 180-200;130-179    Number of hypoglycemic episodes per month 1    Number of hyperglycemic episodes ( >200mg /dL): Weekly    Have you had a dilated eye exam in the past 12 months? Yes    Have you had a dental exam in the past 12 months?  Yes    Are you checking your feet? Yes    How many days per week are you checking your feet? 1      Dietary Intake   Breakfast cheerios with milk and protein shake and fruit    Snack (morning) nuts OR 2-3 [place] bacon    Lunch salmon OR baked chicken with curry cabbage and sweet potatos and coll[employer]greens    Dinner salmon OR baked chicken with curry cabbage and sweet potatos and coll[employer]greens    Snack (evening) nuts or protein shake    Beverage(s) water, sparkling water, herbal tea      Activity / Exercise   Activity / Exercise Type Moderate (swimming / aerobic walking)    How many days per week do you exercise? 4    How many minutes per day do you exercise? 60    Total minutes per week of exercise 240      Patient Education   Previous Diabetes Education No    Disease Pathophysiology Definition of diabetes, type 1 and 2, and the diagnosis of diabetes;Factors that contribute to the development of diabetes;Explored patient's options for treatment of their diabetes    Healthy Eating Role of diet in the treatment of diabetes and the relationship between the three main macronutrients and blood glucose level;Reviewed blood glucose goals for pre and post meals and how to evaluate the patients' food intake on their blood glucose level.;Plate Method;Meal timing in regards to the patients' current diabetes medication.;Effects of alcohol on blood glucose and safety factors with consumption of alcohol.;Information on hints to eating out and maintain blood glucose control.;Meal options for control of blood glucose level and chronic complications.    Being Active Role of exercise on diabetes management, blood pressure control and cardiac health.;Helped patient identify appropriate exercises in relation to his/her diabetes, diabetes complications and other health issue.    Medications Reviewed patients medication for diabetes, action, purpose, timing of dose and side effects.;Reviewed medication adjustment  guidelines for hyperglycemia and sick days.    Monitoring Identified appropriate SMBG and/or A1C goals.;Daily foot exams;Yearly dilated eye exam;Taught/evaluated CGM (comment)    Acute complications Taught prevention, symptoms, and  treatment of hypoglycemia - the 15 rule.;Discussed and identified patients' prevention, symptoms, and treatment of hyperglycemia.    Chronic complications Relationship between chronic complications and blood glucose control;Assessed and discussed foot care and prevention of foot problems;Lipid levels, blood glucose control and heart disease;Identified and discussed with patient  current chronic complications;Dental care;Retinopathy and reason for yearly dilated eye exams;Nephropathy, what it is, prevention of, the use of ACE, ARB's and early detection of through urine microalbumia.;Reviewed with patient heart disease, higher risk of, and prevention    Diabetes Stress and Support Role of stress on diabetes;Identified and addressed patients feelings and concerns about diabetes;Worked with patient to identify barriers to care and solutions  Lifestyle and Health Coping Lifestyle issues that need to be addressed for better diabetes care      Individualized Goals (developed by patient)   Nutrition General guidelines for healthy choices and portions discussed    Physical Activity Exercise 3-5 times per week;60 minutes per day    Medications take my medication as prescribed    Monitoring  Consistenly use CGM    Problem Solving Eating Pattern    Reducing Risk examine blood glucose patterns;stop smoking;do foot checks daily;treat hypoglycemia with 15 grams of carbs if blood glucose less than 70mg /dL    Health Coping Ask for help with psychological, social, or emotional issues      Post-Education Assessment   Patient understands the diabetes disease and treatment process. Comprehends key points    Patient understands incorporating nutritional management into lifestyle.  Comprehends key points    Patient undertands incorporating physical activity into lifestyle. Demonstrates understanding / competency    Patient understands using medications safely. Demonstrates understanding / competency    Patient understands monitoring blood glucose, interpreting and using results Comprehends key points    Patient understands prevention, detection, and treatment of acute complications. Comprehends key points    Patient understands prevention, detection, and treatment of chronic complications. Comprehends key points    Patient understands how to develop strategies to address psychosocial issues. Comprehends key points    Patient understands how to develop strategies to promote health/change behavior. Comprehends key points      Outcomes   Expected Outcomes Demonstrated interest in learning. Expect positive outcomes    Future DMSE 2 months    Program Status Not Completed             Individualized Plan for Diabetes Self-Management Training:   Learning Objective:  Patient will have a greater understanding of diabetes self-management. Patient education plan is to attend individual and/or group sessions per assessed needs and concerns.   Plan:   Patient Instructions  Aim for 150 minutes of physical activity weekly!  -Continue your gym training!  Aim to make 1/2 of your plate vegetables at least 2x/day.  Balance snacks with a carbohydrate and protein.  Aim to eat every 3-5 hours.   Continue to work on reducing alcohol intake and smoking.   Do something every day that brings you joy!  Expected Outcomes:  Demonstrated interest in learning. Expect positive outcomes  Education material provided: ADA - How to Thrive: A Guide for Your Journey with Diabetes, My Plate, and Snack sheet  If problems or questions, patient to contact team via:  Phone  Future DSME appointment: 2 months

## 2022-03-15 NOTE — Patient Instructions (Signed)
Aim for 150 minutes of physical activity weekly!  -Continue your gym training!  Aim to make 1/2 of your plate vegetables at least 2x/day.  Balance snacks with a carbohydrate and protein.  Aim to eat every 3-5 hours.   Continue to work on reducing alcohol intake and smoking.   Do something every day that brings you joy!

## 2022-03-17 ENCOUNTER — Encounter: Payer: Self-pay | Admitting: Adult Health

## 2022-03-17 NOTE — Telephone Encounter (Signed)
Please advise 

## 2022-03-17 NOTE — Telephone Encounter (Signed)
Pt wants to add that the explanation for vision blacking out is ocular migraines

## 2022-03-18 NOTE — Telephone Encounter (Signed)
Patient called and said if the previous message isnt clear to give her a call.

## 2022-03-19 ENCOUNTER — Telehealth (INDEPENDENT_AMBULATORY_CARE_PROVIDER_SITE_OTHER): Payer: 59 | Admitting: Adult Health

## 2022-03-19 ENCOUNTER — Encounter: Payer: Self-pay | Admitting: Adult Health

## 2022-03-19 ENCOUNTER — Telehealth: Payer: Self-pay | Admitting: Adult Health

## 2022-03-19 VITALS — Ht 69.0 in | Wt 185.0 lb

## 2022-03-19 DIAGNOSIS — E1169 Type 2 diabetes mellitus with other specified complication: Secondary | ICD-10-CM

## 2022-03-19 MED ORDER — METFORMIN HCL ER 500 MG PO TB24: 500.0000 mg | ORAL_TABLET | Freq: Two times a day (BID) | ORAL | 1 refills | Status: DC

## 2022-03-19 NOTE — Telephone Encounter (Signed)
This has been taking care of.

## 2022-03-19 NOTE — Telephone Encounter (Addendum)
Pt called to ask if this visit needs to be an in person visit?  She scheduled herself for a video visit this afternoon.  She is unable to come in due to work schedule, but she needs some forms completed for work.    Please advise.

## 2022-03-19 NOTE — Progress Notes (Signed)
Virtual Visit via Video Note  I connected with Danielle [name] on 03/19/22 at  2:30 PM EDT by a video enabled telemedicine application and verified that I am speaking with the correct person using two identifiers.  Location patient: home Location provider:work or home office Persons participating in the virtual visit: patient, provider  I discussed the limitations of evaluation and management by telemedicine and the availability of in person appointments. The patient expressed understanding and agreed to proceed.   HPI: 46 year old female who  has a past medical history of Anxiety, Asthma, Diabetes mellitus without complication (HCC), HPV in female, Hyperlipidemia, Migraine, No blood products, Polycystic ovary syndrome, Seasonal allergies, and Thyroid disease.  She is a newly diagnosed diabetic who was started on Metformin roughly 1 month ago.  She reports feeling nauseated to the point of almost vomiting soon after taking her metformin most days.  She has also been having spikes in blood sugar upwards of 200-300.  She is still learning what she can and cannot eat and has started working with a nutritionist.  She is also going to the gym 2-3 times a week and noticed that when she does exercise her blood sugars are lower.   ROS: See pertinent positives and negatives per HPI.  Past Medical History:  Diagnosis Date   Anxiety    R/T dx   Asthma    dx 2003=1st asthma attack 10/2021   Diabetes mellitus without complication (HCC)    on meds   HPV in female    Hyperlipidemia    diet controlled/exercise   Migraine    No blood products    Polycystic ovary syndrome    Seasonal allergies    Thyroid disease    on meds    Past Surgical History:  Procedure Laterality Date   EXCISION VAGINAL CYST     removal of genital warts   WISDOM TOOTH EXTRACTION      Family History  Problem Relation Age of Onset   Hyperlipidemia Mother    Hypertension Mother    Diabetes Mother    Cancer Father         lung tumor/ lung removed   Hypertension Sister    Breast cancer Paternal Aunt    Breast cancer Paternal Grandmother    Colon polyps Neg Hx    Colon cancer Neg Hx    Esophageal cancer Neg Hx    Rectal cancer Neg Hx    Stomach cancer Neg Hx        Current Outpatient Medications:    Acetaminophen (TYLENOL 8 HOUR PO), Take 1 tablet by mouth every 4 (four) hours as needed., Disp: , Rfl:    albuterol (VENTOLIN HFA) 108 (90 Base) MCG/ACT inhaler, Inhale 1-2 puffs into the lungs every 6 (six) hours as needed for wheezing or shortness of breath., Disp: 1 each, Rfl: 1   buPROPion (WELLBUTRIN SR) 150 MG 12 hr tablet, Take by mouth., Disp: , Rfl:    Continuous Blood Gluc Sensor (FREESTYLE LIBRE 3 SENSOR) MISC, 1 Device by Does not apply route every 14 (fourteen) days. Place 1 sensor on the skin every 14 days. Use to check glucose continuously, Disp: 2 each, Rfl: 2   ELINEST 0.3-30 MG-MCG tablet, Take 1 tablet by mouth daily., Disp: , Rfl:    Ferrous Sulfate (IRON PO), Take 1 tablet by mouth daily at 6 (six) AM. Up to 2 tabs depending on blood loss level, Disp: , Rfl:    fluticasone (FLONASE) 50 MCG/ACT nasal  spray, Place 2 sprays into both nostrils daily. (Patient taking differently: Place 2 sprays into both nostrils 2 (two) times daily as needed.), Disp: 16 g, Rfl: 0   levothyroxine (SYNTHROID) 137 MCG tablet, Take 1 tablet (137 mcg total) by mouth as directed. Two tabs on Sundays and 1 tab rest of the week, Disp: 96 tablet, Rfl: 3   metFORMIN (GLUCOPHAGE) 500 MG tablet, Take 1 tablet (500 mg total) by mouth 2 (two) times daily with a meal., Disp: 180 tablet, Rfl: 1   spironolactone (ALDACTONE) 50 MG tablet, Take 1 tablet (50 mg total) by mouth 2 (two) times daily., Disp: 180 tablet, Rfl: 3   valACYclovir (VALTREX) 500 MG tablet, TAKE 1 CAPLET BY MOUTH TWICE DAILY AS NEEDED, Disp: , Rfl:   EXAM:  VITALS per patient if applicable:  GENERAL: alert, oriented, appears well and in no acute  distress  HEENT: atraumatic, conjunttiva clear, no obvious abnormalities on inspection of external nose and ears  NECK: normal movements of the head and neck  LUNGS: on inspection no signs of respiratory distress, breathing rate appears normal, no obvious gross SOB, gasping or wheezing  CV: no obvious cyanosis  MS: moves all visible extremities without noticeable abnormality  PSYCH/NEURO: pleasant and cooperative, no obvious depression or anxiety, speech and thought processing grossly intact  ASSESSMENT AND PLAN:  Discussed the following assessment and plan:  1. Type 2 diabetes mellitus with other specified complication, without long-term current use of insulin (HCC) - Will switch her to XR metformin - Advised follow up if symptoms do not improve  - metFORMIN (GLUCOPHAGE-XR) 500 MG 24 hr tablet; Take 1 tablet (500 mg total) by mouth 2 (two) times daily with a meal.  Dispense: 60 tablet; Refill: 1      I discussed the assessment and treatment plan with the patient. The patient was provided an opportunity to ask questions and all were answered. The patient agreed with the plan and demonstrated an understanding of the instructions.   The patient was advised to call back or seek an in-person evaluation if the symptoms worsen or if the condition fails to improve as anticipated.   Dorothyann Peng, NP

## 2022-03-22 ENCOUNTER — Encounter: Payer: Self-pay | Admitting: Adult Health

## 2022-03-23 ENCOUNTER — Encounter: Payer: Self-pay | Admitting: Adult Health

## 2022-03-25 ENCOUNTER — Encounter: Payer: Self-pay | Admitting: Gastroenterology

## 2022-03-29 ENCOUNTER — Ambulatory Visit (AMBULATORY_SURGERY_CENTER): Payer: 59 | Admitting: Gastroenterology

## 2022-03-29 ENCOUNTER — Encounter: Payer: Self-pay | Admitting: Gastroenterology

## 2022-03-29 VITALS — BP 117/80 | HR 78 | Resp 14

## 2022-03-29 DIAGNOSIS — Z1211 Encounter for screening for malignant neoplasm of colon: Secondary | ICD-10-CM

## 2022-03-29 MED ORDER — SODIUM CHLORIDE 0.9 % IV SOLN: 500.0000 mL | INTRAVENOUS | Status: DC

## 2022-03-29 NOTE — Op Note (Signed)
[facility] Patient Name: Danielle [name] Procedure Date: 03/29/2022 10:42 AM MRN: [id number] Endoscopist: Tressia Danas MD, MD Age: 46 Referring MD:  Date of Birth: 08-09-1975 Gender: Female Account #: [id number] Procedure:                Colonoscopy Indications:              Screening for colorectal malignant neoplasm, This                            is the patient's first colonoscopy Medicines:                Monitored Anesthesia Care Procedure:                Pre-Anesthesia Assessment:                           - Prior to the procedure, a History and Physical                            was performed, and patient medications and                            allergies were reviewed. The patient's tolerance of                            previous anesthesia was also reviewed. The risks                            and benefits of the procedure and the sedation                            options and risks were discussed with the patient.                            All questions were answered, and informed consent                            was obtained. Prior Anticoagulants: The patient has                            taken no previous anticoagulant or antiplatelet                            agents. ASA Grade Assessment: II - A patient with                            mild systemic disease. After reviewing the risks                            and benefits, the patient was deemed in                            satisfactory condition to undergo the procedure.  After obtaining informed consent, the colonoscope                            was passed under direct vision. Throughout the                            procedure, the patient's blood pressure, pulse, and                            oxygen saturations were monitored continuously. The                            Olympus CF-HQ190L [id number]) Colonoscope was                            introduced through the  anus and advanced to the 3                            cm into the ileum. A second forward view of the                            right colon was performed. The colonoscopy was                            performed without difficulty. The patient tolerated                            the procedure well. The quality of the bowel                            preparation was good. The terminal ileum, ileocecal                            valve, appendiceal orifice, and rectum were                            photographed. Scope In: 10:48:22 AM Scope Out: 11:05:05 AM Scope Withdrawal Time: 0 hours 10 minutes 57 seconds  Total Procedure Duration: 0 hours 16 minutes 43 seconds  Findings:                 The perianal and digital rectal examinations were                            normal.                           Non-bleeding internal hemorrhoids were found.                           The entire examined colon appeared normal on direct                            and retroflexion views. Complications:            No immediate complications.  Estimated Blood Loss:     Estimated blood loss: none. Impression:               - Non-bleeding internal hemorrhoids.                           - The entire examined colon is normal on direct and                            retroflexion views.                           - No specimens collected. Recommendation:           - Patient has a contact number available for                            emergencies. The signs and symptoms of potential                            delayed complications were discussed with the                            patient. Return to normal activities tomorrow.                            Written discharge instructions were provided to the                            patient.                           - Resume previous diet.                           - Continue present medications.                           - Repeat colonoscopy in 10 years for  surveillance,                            earlier with new symptoms.                           - Emerging evidence supports eating a diet of                            fruits, vegetables, grains, calcium, and yogurt                            while reducing red meat and alcohol may reduce the                            risk of colon cancer.                           - Thank you for allowing me to be  involved in your                            colon cancer prevention. Thornton Park MD, MD 03/29/2022 11:10:39 AM This report has been signed electronically.

## 2022-03-29 NOTE — Progress Notes (Signed)
Referring Provider: Dorothyann Peng, NP Primary Care Physician:  Dorothyann Peng, NP  Indication for Procedure:  Colon cancer Surveillance   IMPRESSION:  Need for colon cancer surveillance Appropriate candidate for monitored anesthesia care  PLAN: Colonoscopy in the [facility] today   HPI: Danielle [name] is a 46 y.o. female presents for surveillance colonoscopy.  Prior endoscopic history:  No baseline GI symptoms.   No known family history of colon cancer or polyps. No family history of uterine/endometrial cancer, pancreatic cancer or gastric/stomach cancer.   Past Medical History:  Diagnosis Date   Anxiety    R/T dx   Asthma    dx 2003=1st asthma attack 10/2021   Diabetes mellitus without complication ([place])    on meds   HPV in female    Hyperlipidemia    diet controlled/exercise   Migraine    No blood products    Polycystic ovary syndrome    Seasonal allergies    Thyroid disease    on meds    Past Surgical History:  Procedure Laterality Date   EXCISION VAGINAL CYST     removal of genital warts   WISDOM TOOTH EXTRACTION      Current Outpatient Medications  Medication Sig Dispense Refill   Acetaminophen (TYLENOL 8 HOUR PO) Take 1 tablet by mouth every 4 (four) hours as needed.     buPROPion (WELLBUTRIN SR) 150 MG 12 hr tablet Take by mouth.     Continuous Blood Gluc Sensor (FREESTYLE LIBRE 3 SENSOR) MISC 1 Device by Does not apply route every 14 (fourteen) days. Place 1 sensor on the skin every 14 days. Use to check glucose continuously 2 each 2   levothyroxine (SYNTHROID) 137 MCG tablet Take 1 tablet (137 mcg total) by mouth as directed. Two tabs on Sundays and 1 tab rest of the week 96 tablet 3   spironolactone (ALDACTONE) 50 MG tablet Take 1 tablet (50 mg total) by mouth 2 (two) times daily. 180 tablet 3   albuterol (VENTOLIN HFA) 108 (90 Base) MCG/ACT inhaler Inhale 1-2 puffs into the lungs every 6 (six) hours as needed for wheezing or shortness of breath.  1 each 1   ELINEST 0.3-30 MG-MCG tablet Take 1 tablet by mouth daily.     Ferrous Sulfate (IRON PO) Take 1 tablet by mouth daily at 6 (six) AM. Up to 2 tabs depending on blood loss level     fluticasone (FLONASE) 50 MCG/ACT nasal spray Place 2 sprays into both nostrils daily. (Patient taking differently: Place 2 sprays into both nostrils 2 (two) times daily as needed.) 16 g 0   metFORMIN (GLUCOPHAGE-XR) 500 MG 24 hr tablet Take 1 tablet (500 mg total) by mouth 2 (two) times daily with a meal. 60 tablet 1   valACYclovir (VALTREX) 500 MG tablet TAKE 1 CAPLET BY MOUTH TWICE DAILY AS NEEDED     Current Facility-Administered Medications  Medication Dose Route Frequency Provider Last Rate Last Admin   0.9 %  sodium chloride infusion  500 mL Intravenous Continuous Thornton Park, MD        Allergies as of 03/29/2022 - Review Complete 03/19/2022  Allergen Reaction Noted   Allegra [fexofenadine hcl]  01/09/2012   Sulfa antibiotics  01/09/2012   Aspirin Rash 05/13/2014    Family History  Problem Relation Age of Onset   Hyperlipidemia Mother    Hypertension Mother    Diabetes Mother    Cancer Father        lung tumor/ lung removed  Hypertension Sister    Breast cancer Paternal Aunt    Breast cancer Paternal Grandmother    Colon polyps Neg Hx    Colon cancer Neg Hx    Esophageal cancer Neg Hx    Rectal cancer Neg Hx    Stomach cancer Neg Hx      Physical Exam: General:   Alert,  well-nourished, pleasant and cooperative in NAD Head:  Normocephalic and atraumatic. Eyes:  Sclera clear, no icterus.   Conjunctiva pink. Mouth:  No deformity or lesions.   Neck:  Supple; no masses or thyromegaly. Lungs:  Clear throughout to auscultation.   No wheezes. Heart:  Regular rate and rhythm; no murmurs. Abdomen:  Soft, non-tender, nondistended, normal bowel sounds, no rebound or guarding.  Msk:  Symmetrical. No boney deformities LAD: No inguinal or umbilical LAD Extremities:  No clubbing or  edema. Neurologic:  Alert and  oriented x4;  grossly nonfocal Skin:  No obvious rash or bruise. Psych:  Alert and cooperative. Normal mood and affect.     Studies/Results: No results found.    Danielle Mccay L. Tarri Glenn, MD, MPH 03/29/2022, 10:34 AM

## 2022-03-29 NOTE — Progress Notes (Signed)
Sedate, gd SR, tolerated procedure well, VSS, report to RN 

## 2022-03-29 NOTE — Patient Instructions (Signed)
Thank you for coming in to see Danielle [name] today!  Resume your regular diet and medications/supplements today. Return to regular daily activities tomorrow. Recommend another screening colonoscopy in 10 years @ age 46, sooner if any issues should arise.  YOU HAD AN ENDOSCOPIC PROCEDURE TODAY AT [facility]:   Refer to the procedure report that was given to you for any specific questions about what was found during the examination.  If the procedure report does not answer your questions, please call your gastroenterologist to clarify.  If you requested that your care partner not be given the details of your procedure findings, then the procedure report has been included in a sealed envelope for you to review at your convenience later.  YOU SHOULD EXPECT: Some feelings of bloating in the abdomen. Passage of more gas than usual.  Walking can help get rid of the air that was put into your GI tract during the procedure and reduce the bloating. If you had a lower endoscopy (such as a colonoscopy or flexible sigmoidoscopy) you may notice spotting of blood in your stool or on the toilet paper. If you underwent a bowel prep for your procedure, you may not have a normal bowel movement for a few days.  Please Note:  You might notice some irritation and congestion in your nose or some drainage.  This is from the oxygen used during your procedure.  There is no need for concern and it should clear up in a day or so.  SYMPTOMS TO REPORT IMMEDIATELY:  Following lower endoscopy (colonoscopy or flexible sigmoidoscopy):  Excessive amounts of blood in the stool  Significant tenderness or worsening of abdominal pains  Swelling of the abdomen that is new, acute  Fever of 100F or higher    For urgent or emergent issues, a gastroenterologist can be reached at any hour by calling [phone]. Do not use MyChart messaging for urgent concerns.    DIET:  We do recommend a small meal at first, but then you  may proceed to your regular diet.  Drink plenty of fluids but you should avoid alcoholic beverages for 24 hours.  ACTIVITY:  You should plan to take it easy for the rest of today and you should NOT DRIVE or use heavy machinery until tomorrow (because of the sedation medicines used during the test).    FOLLOW UP: Our staff will call the number listed on your records the next business day following your procedure.  We will call around 7:15- 8:00 am to check on you and address any questions or concerns that you may have regarding the information given to you following your procedure. If we do not reach you, we will leave a message.     If any biopsies were taken you will be contacted by phone or by letter within the next 1-3 weeks.  Please call Danielle [name] at [phone] if you have not heard about the biopsies in 3 weeks.    SIGNATURES/CONFIDENTIALITY: You and/or your care partner have signed paperwork which will be entered into your electronic medical record.  These signatures attest to the fact that that the information above on your After Visit Summary has been reviewed and is understood.  Full responsibility of the confidentiality of this discharge information lies with you and/or your care-partner.

## 2022-03-29 NOTE — Progress Notes (Signed)
Pt's states no medical or surgical changes since previsit or office visit. 

## 2022-03-30 ENCOUNTER — Telehealth: Payer: Self-pay | Admitting: *Deleted

## 2022-03-30 NOTE — Telephone Encounter (Signed)
Returned the patient's phone call regarding when she should be concerned regarding having a BM post Colon. Reassured her it may be a few days before that gets back to normal. She is still a little sore at her anal area and encouraged her to use Desitin and or A and D ointment.  She verbalized understanding.

## 2022-03-30 NOTE — Telephone Encounter (Signed)
Patient returned the call just a little bit sore would like to know how long will it last. She also took a stool softener please advise.

## 2022-03-30 NOTE — Telephone Encounter (Signed)
Attempted to call patient for their post-procedure follow-up call. No answer. Left voicemail.   

## 2022-04-02 ENCOUNTER — Encounter: Payer: Self-pay | Admitting: Internal Medicine

## 2022-04-02 ENCOUNTER — Ambulatory Visit: Payer: 59 | Admitting: Internal Medicine

## 2022-04-02 VITALS — BP 110/74 | HR 94 | Ht 69.0 in | Wt 180.0 lb

## 2022-04-02 DIAGNOSIS — E1165 Type 2 diabetes mellitus with hyperglycemia: Secondary | ICD-10-CM | POA: Diagnosis not present

## 2022-04-02 DIAGNOSIS — Z8639 Personal history of other endocrine, nutritional and metabolic disease: Secondary | ICD-10-CM

## 2022-04-02 DIAGNOSIS — E282 Polycystic ovarian syndrome: Secondary | ICD-10-CM | POA: Diagnosis not present

## 2022-04-02 DIAGNOSIS — E890 Postprocedural hypothyroidism: Secondary | ICD-10-CM | POA: Diagnosis not present

## 2022-04-02 LAB — T4, FREE: Free T4: 1.3 ng/dL (ref 0.60–1.60)

## 2022-04-02 LAB — TSH: TSH: 0.15 u[IU]/mL — ABNORMAL LOW (ref 0.35–5.50)

## 2022-04-02 NOTE — Progress Notes (Signed)
Patient ID: Danielle [name], female   DOB: 1976-02-05, 46 y.o.   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 Last visit 6 months ago.  Interim history: Since last visit, she was diagnosed with diabetes in 01/2022.  She started metformin.  She had nausea with this so she is now on metformin ER.  She goes to the gym 2 times a week - now with a Systems analyst.  She has a CGM. She started to adjust her diet >> lost 14 lbs! No increased urination, nausea, chest pain.  She had blurry vision - saw ophthalmologist >> needs bifocals. She had increased vaginal bleeding >> improved. She was working nights >> now mornings.  Hypothyroidism: She has a history of medication noncompliance with levothyroxine. In 04/2018, she was missing levothyroxine doses and her TSH was high (at that time she had increased stress as she separated from her husband).  She improved compliance afterwards but continues to miss doses.  Pt is on levothyroxine 137 mcg daily, taken: - in am - fasting - at least 30 min from b'fast - no Ca, PPIs, + occasional multivitamins at different times of the day - off iron now - stopped Biotin ([employer])   Reviewed her TFTs: Lab Results  Component Value Date   TSH 9.16 (H) 02/05/2022   TSH 2.18 01/05/2022   TSH 12.40 (H) 09/28/2021   TSH 6.06 (H) 04/10/2020   TSH 6.20 (H) 02/05/2020   TSH 2.89 11/12/2019   TSH 2.26 05/17/2019   TSH 15.43 (H) 05/16/2018   TSH 2.70 05/13/2017   TSH 3.99 11/11/2016   FREET4 1.13 01/05/2022   FREET4 0.73 09/28/2021   FREET4 0.88 04/10/2020   FREET4 1.18 11/12/2019   FREET4 1.04 05/17/2019   FREET4 0.68 05/16/2018   FREET4 1.07 05/13/2017   FREET4 0.87 11/11/2016   FREET4 0.92 07/13/2016   FREET4 1.34 03/05/2016  05/06/2014: (Dr. Dareen Piano, ObGyn): TSH 12.58  At last check, TSI's were  undetectable: Lab Results  Component Value Date   TSI <89 05/16/2018    Pt denies: - feeling nodules in neck - hoarseness - dysphagia - choking  She has + FH of thyroid disorders in: mother. No FH of thyroid cancer. No h/o radiation tx to head or neck. No herbal supplements. No recent Biotin use. No recent steroids use.   PCOS: -She had heavy bleeding, after which we started OCPs in 2017, but continued to have increased bleeding so she was switched to NuvaRing, however, bleeding continued so she stopped it.  She was then in a research trial with a form of oral contraceptive.  Her bleeding improved.  The study finished in 08/2019.  -In 2021, she wanted to switch back to oral contraceptives.  Due to previous history of increased vaginal bleeding on these, I directed her back to OB/GYN >> she restarted OCPs before our last visit -norethindrone.  However, since last visit, she came off this. ObGyn: Dr. Leone Payor.  -In 08/2020, she wanted to start back spironolactone.  I sent a prescription for this but she did not restart.   Prediabetes:  Reviewed HbA1c levels:: Lab Results  Component Value Date   HGBA1C 7.0 (H) 02/05/2022   HGBA1C 6.9 (H) 09/28/2021   HGBA1C 5.9 (A) 09/08/2020   HGBA1C 6.2 04/10/2020   HGBA1C 6.2 (A) 11/12/2019   HGBA1C 6.4 05/17/2019  HGBA1C 6.2 05/16/2018   HGBA1C 5.9 08/15/2012   Prior insurance did not cover nutrition visits when she had prediabetes.  No CKD: Lab Results  Component Value Date   BUN 13 03/02/2022   Lab Results  Component Value Date   CREATININE 0.94 03/02/2022   + HL: Lab Results  Component Value Date   CHOL 284 (H) 02/05/2022   HDL 47.00 02/05/2022   LDLCALC 211 (H) 02/05/2022   TRIG 134.0 02/05/2022   CHOLHDL 6 02/05/2022  She is not on a statin.  ROS:  + see HPI  I reviewed pt's medications, allergies, PMH, social hx, family hx, and changes were documented in the history of present illness. Otherwise, unchanged from  my initial visit note.  Past Medical History:  Diagnosis Date   Anxiety    R/T dx   Asthma    dx 2003=1st asthma attack 10/2021   Diabetes mellitus without complication ([facility])    on meds   HPV in female    Hyperlipidemia    diet controlled/exercise   Migraine    No blood products    Polycystic ovary syndrome    Seasonal allergies    Thyroid disease    on meds   Past Surgical History:  Procedure Laterality Date   EXCISION VAGINAL CYST     removal of genital warts   WISDOM TOOTH EXTRACTION     History   Social History   Marital Status: Married    Spouse Name: N/A    Number of Children: 0   Occupational History      Social History Main Topics   Smoking status: Former Smoker   Smokeless tobacco: Not on file   Alcohol Use: Yes   Drug Use: No   Sexual Activity: Yes    Birth Control/ Protection: Pill   Current Outpatient Medications on File Prior to Visit  Medication Sig Dispense Refill   Acetaminophen (TYLENOL 8 HOUR PO) Take 1 tablet by mouth every 4 (four) hours as needed.     albuterol (VENTOLIN HFA) 108 (90 Base) MCG/ACT inhaler Inhale 1-2 puffs into the lungs every 6 (six) hours as needed for wheezing or shortness of breath. 1 each 1   buPROPion (WELLBUTRIN SR) 150 MG 12 hr tablet Take by mouth.     Continuous Blood Gluc Sensor (FREESTYLE LIBRE 3 SENSOR) MISC 1 Device by Does not apply route every 14 (fourteen) days. Place 1 sensor on the skin every 14 days. Use to check glucose continuously 2 each 2   ELINEST 0.3-30 MG-MCG tablet Take 1 tablet by mouth daily.     Ferrous Sulfate (IRON PO) Take 1 tablet by mouth daily at 6 (six) AM. Up to 2 tabs depending on blood loss level     fluticasone (FLONASE) 50 MCG/ACT nasal spray Place 2 sprays into both nostrils daily. (Patient taking differently: Place 2 sprays into both nostrils 2 (two) times daily as needed.) 16 g 0   levothyroxine (SYNTHROID) 137 MCG tablet Take 1 tablet (137 mcg total) by mouth as directed. Two tabs  on Sundays and 1 tab rest of the week 96 tablet 3   metFORMIN (GLUCOPHAGE-XR) 500 MG 24 hr tablet Take 1 tablet (500 mg total) by mouth 2 (two) times daily with a meal. 60 tablet 1   spironolactone (ALDACTONE) 50 MG tablet Take 1 tablet (50 mg total) by mouth 2 (two) times daily. 180 tablet 3   valACYclovir (VALTREX) 500 MG tablet TAKE 1 CAPLET BY MOUTH TWICE DAILY  AS NEEDED     No current facility-administered medications on file prior to visit.   Allergies  Allergen Reactions   Allegra [Fexofenadine Hcl]     Swelling occurs   Sulfa Antibiotics    Aspirin Rash    Baby asprin; as a child, rash from dyes    Family History  Problem Relation Age of Onset   Hyperlipidemia Mother    Hypertension Mother    Diabetes Mother    Cancer Father        lung tumor/ lung removed   Hypertension Sister    Breast cancer Paternal Aunt    Breast cancer Paternal Grandmother    Colon polyps Neg Hx    Colon cancer Neg Hx    Esophageal cancer Neg Hx    Rectal cancer Neg Hx    Stomach cancer Neg Hx    PE: BP 110/74 (BP Location: Left Arm, Patient Position: Sitting, Cuff Size: Normal)   Pulse 94   Ht 5\' 9"  (1.753 m)   Wt 180 lb (81.6 kg)   LMP 01/16/2022   SpO2 100%   BMI 26.58 kg/m  Wt Readings from Last 3 Encounters:  04/02/22 180 lb (81.6 kg)  03/19/22 185 lb (83.9 kg)  03/15/22 185 lb 1.6 oz (84 kg)   Constitutional: overweight, in NAD Eyes:  EOMI, no exophthalmos ENT: no neck masses, no cervical lymphadenopathy Cardiovascular: tachycardia, RR, No MRG Respiratory: CTA B Musculoskeletal: no deformities Skin:no rashes Neurological: no tremor with outstretched hands  ASSESSMENT: 1. Postablative Hypothyroidism - after RAI tx for Graves ds.  2.  History of Graves' disease  3. PCOS  4.  DM2  PLAN:  1. Patient with longstanding hypothyroidism, history of incomplete compliance with levothyroxine - latest thyroid labs reviewed with pt. >> after increasing the dose of LT4 at last  visit, TSH normalized, however, at last check, this was again elevated -retrospectively, this was due to taking all her medicines together in the morning along with levothyroxine.  Since then, she separated them. Lab Results  Component Value Date   TSH 9.16 (H) 02/05/2022  - she continues on LT4 137 mcg daily (dose increased after last visit) + 2 tablets on Sundays - pt feels good on this dose. - we discussed about taking the thyroid hormone every day, with water, >30 minutes before breakfast, separated by >4 hours from acid reflux medications, calcium, iron, multivitamins. Pt. is taking it correctly. - will check thyroid tests today: TSH and fT4 - If labs are abnormal, she will need to return for repeat TFTs in 1.5 months  2.  History of Graves' disease -Resolved after RAI treatment -At this TSI's are undetectable -She denies neck compression symptoms -No symptoms or signs of active Graves' ophthalmopathy: No exophthalmos, double vision, eye pain, chemosis -She previously described  periodic vision changes but these were unlikely to have been related to Graves' disease.  They appeared to be part of aura before syncopal events.  With the last event she lost consciousness completely and lost control of her bladder.  We discussed that she may have had a seizure.  She was very dizzy and was not able to have this checked out.  At last visit, I again recommended to see neurology -recommended to discuss with PCP about the referral.  She did not see them yet.  3. PCOS -She has a history of heavy menstrual cycles and was on oral contraceptives, then NuvaRing per OB/GYN.  Afterwards, she wanted to switch back to [facility]  but due to previous history of increased bleeding, I recommended that she contacted OB/GYN for this. -At last visit, she was off norethindrone and was interested in starting back on spironolactone.  I called this in for her but she did not start it.  At last visit, she wanted to hold  off. -She is approaching age of menopause.  No intervention is absolutely necessary now  4.  Diabetes  -new dx since last visit -At last visit, she had prediabetes with HbA1c of 5.9%.  However, since last visit, she was diagnosed with diabetes, with the latest HbA1c obtained 2 months ago being 7.0% -Her PCP started her on metformin but she had nausea so she is now on metformin ER.  She also has a CGM. -She is currently going to the gym 2-3 times a week.  I advised her to continue this and maybe increase exercise to 5 times a week. -We did discuss in the past about improving diet.  She started to improve her diet since last visit and was able to lose 14 pounds! -mngm per PCP  Component     Latest Ref Rng 04/02/2022  TSH     0.35 - 5.50 uIU/mL 0.15 (L)   T4,Free(Direct)     0.60 - 1.60 ng/dL 0.62     TSH is now suppressed now that she is taking the levothyroxine consistently and also possibly due to the weight loss.  We will reduce the dose back to 137 mcg daily and recheck the test in 1.5 months.  Carlus Pavlov, MD PhD [facility]

## 2022-04-02 NOTE — Patient Instructions (Addendum)
Please stop at the lab.  Please continue: - Levothyroxine 137 mcg daily + extra 1 tab on Sundays  Take the thyroid hormone every day, with water, at least 30 minutes before breakfast, separated by at least 4 hours from: - acid reflux medications - calcium - iron - multivitamins  Please come back for a follow-up appointment in 6 months.

## 2022-04-19 ENCOUNTER — Encounter (HOSPITAL_BASED_OUTPATIENT_CLINIC_OR_DEPARTMENT_OTHER): Payer: Self-pay | Admitting: Emergency Medicine

## 2022-04-19 ENCOUNTER — Emergency Department (HOSPITAL_BASED_OUTPATIENT_CLINIC_OR_DEPARTMENT_OTHER)
Admission: EM | Admit: 2022-04-19 | Discharge: 2022-04-19 | Disposition: A | Discharge status: Home / Self Care | Payer: 59 | Attending: Emergency Medicine | Admitting: Emergency Medicine

## 2022-04-19 ENCOUNTER — Other Ambulatory Visit: Payer: Self-pay

## 2022-04-19 DIAGNOSIS — R197 Diarrhea, unspecified: Secondary | ICD-10-CM | POA: Insufficient documentation

## 2022-04-19 DIAGNOSIS — R55 Syncope and collapse: Secondary | ICD-10-CM | POA: Insufficient documentation

## 2022-04-19 DIAGNOSIS — R109 Unspecified abdominal pain: Secondary | ICD-10-CM | POA: Insufficient documentation

## 2022-04-19 DIAGNOSIS — R112 Nausea with vomiting, unspecified: Secondary | ICD-10-CM | POA: Diagnosis not present

## 2022-04-19 LAB — CBC WITH DIFFERENTIAL/PLATELET
Abs Immature Granulocytes: 0.04 10*3/uL (ref 0.00–0.07)
Basophils Absolute: 0.1 10*3/uL (ref 0.0–0.1)
Basophils Relative: 1 %
Eosinophils Absolute: 0 10*3/uL (ref 0.0–0.5)
Eosinophils Relative: 0 %
HCT: 35.6 % — ABNORMAL LOW (ref 36.0–46.0)
Hemoglobin: 11.4 g/dL — ABNORMAL LOW (ref 12.0–15.0)
Immature Granulocytes: 1 %
Lymphocytes Relative: 11 %
Lymphs Abs: 0.9 10*3/uL (ref 0.7–4.0)
MCH: 26.8 pg (ref 26.0–34.0)
MCHC: 32 g/dL (ref 30.0–36.0)
MCV: 83.8 fL (ref 80.0–100.0)
Monocytes Absolute: 0.4 10*3/uL (ref 0.1–1.0)
Monocytes Relative: 5 %
Neutro Abs: 7.1 10*3/uL (ref 1.7–7.7)
Neutrophils Relative %: 82 %
Platelets: 382 10*3/uL (ref 150–400)
RBC: 4.25 MIL/uL (ref 3.87–5.11)
RDW: 14.8 % (ref 11.5–15.5)
WBC: 8.5 10*3/uL (ref 4.0–10.5)
nRBC: 0 % (ref 0.0–0.2)

## 2022-04-19 LAB — COMPREHENSIVE METABOLIC PANEL
ALT: 21 U/L (ref 0–44)
AST: 20 U/L (ref 15–41)
Albumin: 3.9 g/dL (ref 3.5–5.0)
Alkaline Phosphatase: 41 U/L (ref 38–126)
Anion gap: 9 (ref 5–15)
BUN: 12 mg/dL (ref 6–20)
CO2: 16 mmol/L — ABNORMAL LOW (ref 22–32)
Calcium: 8.9 mg/dL (ref 8.9–10.3)
Chloride: 113 mmol/L — ABNORMAL HIGH (ref 98–111)
Creatinine, Ser: 0.82 mg/dL (ref 0.44–1.00)
GFR, Estimated: >60 mL/min (ref >60)
Glucose, Bld: 153 mg/dL — ABNORMAL HIGH (ref 70–99)
Potassium: 4 mmol/L (ref 3.5–5.1)
Sodium: 138 mmol/L (ref 135–145)
Total Bilirubin: 0.4 mg/dL (ref 0.3–1.2)
Total Protein: 8.2 g/dL — ABNORMAL HIGH (ref 6.5–8.1)

## 2022-04-19 LAB — MAGNESIUM: Magnesium: 2 mg/dL (ref 1.7–2.4)

## 2022-04-19 LAB — HCG, SERUM, QUALITATIVE: Preg, Serum: NEGATIVE

## 2022-04-19 LAB — CBG MONITORING, ED: Glucose-Capillary: 115 mg/dL — ABNORMAL HIGH (ref 70–99)

## 2022-04-19 LAB — LIPASE, BLOOD: Lipase: 34 U/L (ref 11–51)

## 2022-04-19 MED ORDER — ONDANSETRON 4 MG PO TBDP: ORAL_TABLET | ORAL | 0 refills | Status: AC

## 2022-04-19 MED ORDER — SODIUM CHLORIDE 0.9 % IV BOLUS
1000.0000 mL | Freq: Once | INTRAVENOUS | Status: AC
  Administered 2022-04-19: 1000 mL via INTRAVENOUS

## 2022-04-19 MED ORDER — ONDANSETRON HCL 4 MG/2ML IJ SOLN
4.0000 mg | Freq: Once | INTRAMUSCULAR | Status: AC
  Administered 2022-04-19: 4 mg via INTRAVENOUS
  Filled 2022-04-19: qty 2

## 2022-04-19 MED ORDER — MORPHINE SULFATE (PF) 4 MG/ML IV SOLN
4.0000 mg | Freq: Once | INTRAVENOUS | Status: AC
  Administered 2022-04-19: 4 mg via INTRAVENOUS
  Filled 2022-04-19: qty 1

## 2022-04-19 NOTE — ED Triage Notes (Signed)
Patient presents C/O syncopal episode this AM when she got up to use the bathroom. She got diaphoretic and felt like her vision was "going out." Patient was found on the floor by mother. Also reports abdominal cramping that started yesterday.

## 2022-04-19 NOTE — Discharge Instructions (Signed)
Take the nausea medicine for nausea.  You can take Imodium for diarrhea.  This is bought over-the-counter.  Please follow the instructions on the bottle.  Please return for worsening belly pain inability to eat or drink.

## 2022-04-19 NOTE — ED Triage Notes (Signed)
Also repots N/V/D

## 2022-04-19 NOTE — ED Provider Notes (Signed)
[facility] EMERGENCY DEPARTMENT Provider Note   CSN: [id number] Arrival date & time: 04/19/22  1146     History  Chief Complaint  Patient presents with   Loss of Consciousness    Danielle [name] is a 46 y.o. female.  46 yo F with a chief complaints of nausea vomiting diarrhea and a syncopal event.  Started this morning with abdominal cramping she went to go to the bathroom and then felt like she got warm all over felt her vision changed and then she woke up on the ground.  Her mother found her like that.  Since then she has been having nausea vomiting and diarrhea just feeling weak all over.  Diffuse abdominal cramping.  No suspicious food intake no known sick contacts.   Loss of Consciousness      [name] Medications Prior to Admission medications   Medication Sig Start Date End Date Taking? Authorizing Provider  ondansetron (ZOFRAN-ODT) 4 MG disintegrating tablet 4mg  ODT q4 hours prn nausea/vomit 04/19/22  Yes 04/21/22, DO  Acetaminophen (TYLENOL 8 HOUR PO) Take 1 tablet by mouth every 4 (four) hours as needed.    [provider]  albuterol (VENTOLIN HFA) 108 (90 Base) MCG/ACT inhaler Inhale 1-2 puffs into the lungs every 6 (six) hours as needed for wheezing or shortness of breath. 11/11/21   11/13/21, MD  buPROPion [facility] SR) 150 MG 12 hr tablet Take by mouth. 01/05/22   [provider]  Continuous Blood Gluc Sensor (FREESTYLE LIBRE 3 SENSOR) MISC 1 Device by Does not apply route every 14 (fourteen) days. Place 1 sensor on the skin every 14 days. Use to check glucose continuously 02/12/22   Nafziger, 02/14/22, NP  ELINEST 0.3-30 MG-MCG tablet Take 1 tablet by mouth daily. 02/25/22   [provider]  Ferrous Sulfate (IRON PO) Take 1 tablet by mouth daily at 6 (six) AM. Up to 2 tabs depending on blood loss level    [provider]  fluticasone (FLONASE) 50 MCG/ACT nasal spray Place 2 sprays into both nostrils daily. Patient  taking differently: Place 2 sprays into both nostrils 2 (two) times daily as needed. 08/28/21   10/28/21, PA-C  levothyroxine (SYNTHROID) 137 MCG tablet Take 1 tablet (137 mcg total) by mouth as directed. Two tabs on Sundays and 1 tab rest of the week 02/09/22   02/11/22, MD  metFORMIN (GLUCOPHAGE-XR) 500 MG 24 hr tablet Take 1 tablet (500 mg total) by mouth 2 (two) times daily with a meal. 03/19/22 04/18/22  Nafziger, 04/20/22, NP  spironolactone (ALDACTONE) 50 MG tablet Take 1 tablet (50 mg total) by mouth 2 (two) times daily. 01/15/22   01/17/22, MD  valACYclovir (VALTREX) 500 MG tablet TAKE 1 CAPLET BY MOUTH TWICE DAILY AS NEEDED    [provider]      Allergies    Allegra [fexofenadine hcl], Sulfa antibiotics, and Aspirin    Review of Systems   Review of Systems  Cardiovascular:  Positive for syncope.    Physical Exam Updated Vital Signs BP (!) 141/86   Pulse 84   Temp 98.7 F (37.1 C) (Oral)   Resp 17   Ht 5\' 10"  (1.778 m)   Wt 80.7 kg   SpO2 99%   BMI 25.54 kg/m  Physical Exam Vitals and nursing note reviewed.  Constitutional:      General: She is not in acute distress.    Appearance: She is well-developed. She is not diaphoretic.  HENT:     Head: Normocephalic and atraumatic.  Eyes:     Pupils: Pupils are equal, round, and reactive to light.  Cardiovascular:     Rate and Rhythm: Normal rate and regular rhythm.     Heart sounds: No murmur heard.    No friction rub. No gallop.  Pulmonary:     Effort: Pulmonary effort is normal.     Breath sounds: No wheezing or rales.  Abdominal:     General: There is no distension.     Palpations: Abdomen is soft.     Tenderness: There is no abdominal tenderness.     Comments: Very mild discomfort diffusely.  No focal tenderness.  Musculoskeletal:        General: No tenderness.     Cervical back: Normal range of motion and neck supple.  Skin:    General: Skin is warm and dry.  Neurological:      Mental Status: She is alert and oriented to person, place, and time.  Psychiatric:        Behavior: Behavior normal.     ED Results / Procedures / Treatments   Labs (all labs ordered are listed, but only abnormal results are displayed) Labs Reviewed  CBC WITH DIFFERENTIAL/PLATELET - Abnormal; Notable for the following components:      Result Value   Hemoglobin 11.4 (*)    HCT 35.6 (*)    All other components within normal limits  COMPREHENSIVE METABOLIC PANEL - Abnormal; Notable for the following components:   Chloride 113 (*)    CO2 16 (*)    Glucose, Bld 153 (*)    Total Protein 8.2 (*)    All other components within normal limits  CBG MONITORING, ED - Abnormal; Notable for the following components:   Glucose-Capillary 115 (*)    All other components within normal limits  LIPASE, BLOOD  MAGNESIUM  HCG, SERUM, QUALITATIVE    EKG EKG Interpretation  Date/Time:  Monday April 19 2022 12:05:22 EDT Ventricular Rate:  99 PR Interval:  140 QRS Duration: 72 QT Interval:  320 QTC Calculation: 410 R Axis:   59 Text Interpretation: Normal sinus rhythm Normal ECG no wpw, prolonged qt or brugada No old tracing to compare Confirmed by Deno Etienne [phone]) on 04/19/2022 12:18:24 PM  Radiology No results found.  Procedures Procedures    Medications Ordered in ED Medications  sodium chloride 0.9 % bolus 1,000 mL (0 mLs Intravenous Stopped 04/19/22 1415)  morphine (PF) 4 MG/ML injection 4 mg (4 mg Intravenous Given 04/19/22 1301)  ondansetron (ZOFRAN) injection 4 mg (4 mg Intravenous Given 04/19/22 1300)    ED Course/ Medical Decision Making/ A&P                           Medical Decision Making Amount and/or Complexity of Data Reviewed Labs: ordered.  Risk Prescription drug management.   46 yo F with a chief complaint of nausea vomiting and diarrhea and a syncopal event.  Syncopal event sounds vasovagal by history.  Will give a bolus of IV fluids.  Check CBC to  assess for acute anemia check electrolytes.  Reassess.  Patient is feeling much better on reassessment.  No significant anemia no significant electrolyte abnormality she has a metabolic acidosis without anion gap.  I think this is likely secondary to diarrhea.  LFTs and lipase are unremarkable.  Magnesium is normal pregnancy test is negative.  She is tolerating  p.o.  Will discharge [name].  2:29 PM:  I have discussed the diagnosis/risks/treatment options with the patient and family.  Evaluation and diagnostic testing in the emergency department does not suggest an emergent condition requiring admission or immediate intervention beyond what has been performed at this time.  They will follow up with PCP. We also discussed returning to the ED immediately if new or worsening sx occur. We discussed the sx which are most concerning (e.g., sudden worsening pain, fever, inability to tolerate by mouth) that necessitate immediate return. Medications administered to the patient during their visit and any new prescriptions provided to the patient are listed below.  Medications given during this visit Medications  sodium chloride 0.9 % bolus 1,000 mL (0 mLs Intravenous Stopped 04/19/22 1415)  morphine (PF) 4 MG/ML injection 4 mg (4 mg Intravenous Given 04/19/22 1301)  ondansetron (ZOFRAN) injection 4 mg (4 mg Intravenous Given 04/19/22 1300)     The patient appears reasonably screen and/or stabilized for discharge and I doubt any other medical condition or other [facility] requiring further screening, evaluation, or treatment in the ED at this time prior to discharge.          Final Clinical Impression(s) / ED Diagnoses Final diagnoses:  Nausea vomiting and diarrhea  Syncope and collapse    Rx / DC Orders ED Discharge Orders          Ordered    ondansetron (ZOFRAN-ODT) 4 MG disintegrating tablet        04/19/22 1400              Melene Plan, DO 04/19/22 1429

## 2022-04-20 ENCOUNTER — Telehealth: Payer: Self-pay

## 2022-04-20 NOTE — Telephone Encounter (Signed)
Transition Care Management Follow-up Telephone Call Date of discharge and from where: 04/19/22 from [facility] for Nausea with vomiting, unspecified How have you been since you were released from the hospital? Still not 100%; sore on the side she fell on when she passed out. Taking Tylenol Any questions or concerns? No  Items Reviewed: Did the pt receive and understand the discharge instructions provided? Yes  Medications obtained and verified? Yes  Other?  Pt states she has obtained the Zofran but has not needed to take it. Any new allergies since your discharge? No    Follow up appointments reviewed:  PCP Hospital f/u appt confirmed? No  Pt states she does not think she needs to see Tommi Rumps; she plans to just wait it out. Advised on what d/c instructions said: Call Dorothyann Peng, NP (Family Medicine) on 04/19/2022; discuss your visit, see when they want to see you in the office. Will send to PCP to determine if appt needed.  Of Note: pt has DM appt on 05/11/22. States she will come earlier if PCP recommends. Will call pt if PCP recommends earlier appt. Are transportation arrangements needed? No  If their condition worsens, is the pt aware to call PCP or go to the Emergency Dept.? Yes Was the patient provided with contact information for the PCP's office or ED? Yes Was to pt encouraged to call back with questions or concerns? Yes

## 2022-04-22 ENCOUNTER — Other Ambulatory Visit: Payer: Self-pay

## 2022-04-22 ENCOUNTER — Encounter: Payer: Self-pay | Admitting: Adult Health

## 2022-04-22 DIAGNOSIS — E1169 Type 2 diabetes mellitus with other specified complication: Secondary | ICD-10-CM

## 2022-04-22 MED ORDER — METFORMIN HCL ER 500 MG PO TB24: 500.0000 mg | ORAL_TABLET | Freq: Two times a day (BID) | ORAL | 0 refills | Status: DC

## 2022-04-22 MED ORDER — FREESTYLE LIBRE 3 SENSOR MISC: 1.0000 | 2 refills | Status: DC

## 2022-04-23 ENCOUNTER — Encounter: Payer: Self-pay | Admitting: Adult Health

## 2022-04-23 DIAGNOSIS — E1169 Type 2 diabetes mellitus with other specified complication: Secondary | ICD-10-CM

## 2022-04-27 MED ORDER — METFORMIN HCL ER 500 MG PO TB24: 500.0000 mg | ORAL_TABLET | Freq: Two times a day (BID) | ORAL | 0 refills | Status: DC

## 2022-04-27 MED ORDER — BUPROPION HCL ER (SR) 150 MG PO TB12: 150.0000 mg | ORAL_TABLET | Freq: Every day | ORAL | 0 refills | Status: DC

## 2022-04-27 NOTE — Telephone Encounter (Signed)
Patient calling in to request that metFORMIN (GLUCOPHAGE-XR) 500 MG 24 hr tablet, levothyroxine (SYNTHROID) 137 MCG tablet,  buPROPion (WELLBUTRIN SR) 150 MG 12 hr tablet, spironolactone (ALDACTONE) 50 MG tablet  [place], [place] Phone:  [phone]  Fax:  [phone]    She has corrected the address with [facility]

## 2022-05-04 ENCOUNTER — Ambulatory Visit: Payer: 59 | Admitting: Adult Health

## 2022-05-05 ENCOUNTER — Other Ambulatory Visit: Payer: Self-pay | Admitting: Adult Health

## 2022-05-05 MED ORDER — BUPROPION HCL ER (XL) 150 MG PO TB24: 150.0000 mg | ORAL_TABLET | Freq: Every day | ORAL | 1 refills | Status: DC

## 2022-05-08 ENCOUNTER — Encounter: Payer: Self-pay | Admitting: Adult Health

## 2022-05-11 ENCOUNTER — Ambulatory Visit: Payer: 59 | Admitting: Adult Health

## 2022-05-11 ENCOUNTER — Encounter: Payer: Self-pay | Admitting: Adult Health

## 2022-05-11 VITALS — BP 110/80 | HR 99 | Temp 98.9°F | Ht 69.75 in | Wt 182.0 lb

## 2022-05-11 DIAGNOSIS — T671XXA Heat syncope, initial encounter: Secondary | ICD-10-CM

## 2022-05-11 DIAGNOSIS — E785 Hyperlipidemia, unspecified: Secondary | ICD-10-CM

## 2022-05-11 DIAGNOSIS — Z114 Encounter for screening for human immunodeficiency virus [HIV]: Secondary | ICD-10-CM | POA: Diagnosis not present

## 2022-05-11 DIAGNOSIS — E1165 Type 2 diabetes mellitus with hyperglycemia: Secondary | ICD-10-CM

## 2022-05-11 LAB — POCT GLYCOSYLATED HEMOGLOBIN (HGB A1C): Hemoglobin A1C: 6.3 % — AB (ref 4.0–5.6)

## 2022-05-11 NOTE — Progress Notes (Signed)
Subjective:    Patient ID: Danielle [name], female    DOB: [birth year], 46 y.o.   MRN: [id number]  HPI 46 year old female who  has a past medical history of Anxiety, Asthma, Diabetes mellitus without complication (HCC), HPV in female, Hyperlipidemia, Migraine, No blood products, Polycystic ovary syndrome, Seasonal allergies, and Thyroid disease.  He presents to the office today for follow-up regarding diabetes, hyperlipidemia, and syncope.    Diabetes mellitus type II-was diagnosed 3 months ago when her A1c was 7.0.  She is currently managed with metformin 500 mg extended release twice daily.  She has been using a libre 3 sensor to manage her blood sugars  She reports that since being diagnosed with diabetes she has completely turned around her diet, and is eating healthier, drinking less alcohol, and is quit smoking.  She is also exercising more on a regular basis, multiple times a week.  Original prescription for metformin was 500 mg ER BID. She reports that she never really started the twice daily dosing and instead has been taking it once a day.  CONTINUOUS GLUCOSE MONITORING RECORD INTERPRETATION                 Dates of Recording:    Sensor description: freestyle libre 3   Results statistics:   CGM use % of time 96%  Average and SD   Time in range 92%  % Time Above 180 8%  % Time above 250 0%  % Time Below target 0%    Glycemic patterns summary:  Bg's optimal during the day and night    Hyperglycemic episodes  with meals: few and far between   Hypoglycemic episodes  n/a   Overnight periods: optimal  Hyperlipidemia -he was advised when she was diagnosed with diabetes that she would benefit from starting a statin medication.  She wanted to refrain from doing so and see what she could do with just diet and exercise alone.  She would like to recheck a panel today  Syncope-this is an ongoing issue her first episode was in March 2021, she reports then she had a second  episode in November 2021, this is her third episode happened in February 2022 and most recently had a syncopal event 2 weeks ago.  She reports that her vision goes dark, she feels hot and will pass out.  She does not know how long she has been out.  The one common thing that she can think about is that she has been hot during the events. During one or possible more events she was exercising. Her most recent EKG - last month showed NSR, rate 99.   Would also like to be checked for STDs.  She is in a new relationship, she is not worried about having any STDs and denies any symptoms.    Review of Systems See HPI   Past Medical History:  Diagnosis Date   Anxiety    R/T dx   Asthma    dx 2003=1st asthma attack 10/2021   Diabetes mellitus without complication (HCC)    on meds   HPV in female    Hyperlipidemia    diet controlled/exercise   Migraine    No blood products    Polycystic ovary syndrome    Seasonal allergies    Thyroid disease    on meds    Social History   Socioeconomic History   Marital status: Single    Spouse name: Not on file   Number  of children: Not on file   Years of education: Not on file   Highest education level: Not on file  Occupational History   Not on file  Tobacco Use   Smoking status: Former    Types: Cigarettes    Quit date: 01/30/2022    Years since quitting: 0.2   Smokeless tobacco: Never  Vaping Use   Vaping Use: Never used  Substance and Sexual Activity   Alcohol use: Not Currently    Alcohol/week: 4.0 - 6.0 standard drinks of alcohol    Types: 2 - 3 Glasses of wine, 2 - 3 Shots of liquor per week    Comment: Socially   Drug use: No   Sexual activity: Yes    Birth control/protection: Pill  Other Topics Concern   Not on file  Social History Narrative   Not on file   Social Determinants of Health   Financial Resource Strain: Not on file  Food Insecurity: Not on file  Transportation Needs: Not on file  Physical Activity: Not on file   Stress: Not on file  Social Connections: Not on file  Intimate Partner Violence: Not on file    Past Surgical History:  Procedure Laterality Date   EXCISION VAGINAL CYST     removal of genital warts   WISDOM TOOTH EXTRACTION      Family History  Problem Relation Age of Onset   Hyperlipidemia Mother    Hypertension Mother    Diabetes Mother    Cancer Father        lung tumor/ lung removed   Hypertension Sister    Breast cancer Paternal Aunt    Breast cancer Paternal Grandmother    Colon polyps Neg Hx    Colon cancer Neg Hx    Esophageal cancer Neg Hx    Rectal cancer Neg Hx    Stomach cancer Neg Hx     Allergies  Allergen Reactions   Allegra [Fexofenadine Hcl]     Swelling occurs   Sulfa Antibiotics    Aspirin Rash    Baby asprin; as a child, rash from dyes     Current Outpatient Medications on File Prior to Visit  Medication Sig Dispense Refill   Acetaminophen (TYLENOL 8 HOUR PO) Take 1 tablet by mouth every 4 (four) hours as needed.     albuterol (VENTOLIN HFA) 108 (90 Base) MCG/ACT inhaler Inhale 1-2 puffs into the lungs every 6 (six) hours as needed for wheezing or shortness of breath. 1 each 1   buPROPion (WELLBUTRIN XL) 150 MG 24 hr tablet Take 1 tablet (150 mg total) by mouth daily. 90 tablet 1   Continuous Blood Gluc Sensor (FREESTYLE LIBRE 3 SENSOR) MISC 1 Device by Does not apply route every 14 (fourteen) days. Place 1 sensor on the skin every 14 days. Use to check glucose continuously 2 each 2   ELINEST 0.3-30 MG-MCG tablet Take 1 tablet by mouth daily.     Ferrous Sulfate (IRON PO) Take 1 tablet by mouth daily at 6 (six) AM. Up to 2 tabs depending on blood loss level     fluticasone (FLONASE) 50 MCG/ACT nasal spray Place 2 sprays into both nostrils daily. (Patient taking differently: Place 2 sprays into both nostrils 2 (two) times daily as needed.) 16 g 0   levothyroxine (SYNTHROID) 137 MCG tablet Take 1 tablet (137 mcg total) by mouth as directed. Two tabs  on Sundays and 1 tab rest of the week 96 tablet 3   metFORMIN (  GLUCOPHAGE-XR) 500 MG 24 hr tablet Take 1 tablet (500 mg total) by mouth 2 (two) times daily with a meal. 180 tablet 0   ondansetron (ZOFRAN-ODT) 4 MG disintegrating tablet 4mg  ODT q4 hours prn nausea/vomit 20 tablet 0   spironolactone (ALDACTONE) 50 MG tablet Take 1 tablet (50 mg total) by mouth 2 (two) times daily. 180 tablet 3   valACYclovir (VALTREX) 500 MG tablet TAKE 1 CAPLET BY MOUTH TWICE DAILY AS NEEDED     No current facility-administered medications on file prior to visit.    BP 110/80   Pulse 99   Temp 98.9 F (37.2 C) (Oral)   Ht 5' 9.75" (1.772 m)   Wt 182 lb (82.6 kg)   SpO2 100%   BMI 26.30 kg/m       Objective:   Physical Exam Vitals and nursing note reviewed.  Constitutional:      Appearance: Normal appearance.  Cardiovascular:     Rate and Rhythm: Normal rate and regular rhythm.     Pulses: Normal pulses.     Heart sounds: Normal heart sounds.  Pulmonary:     Effort: Pulmonary effort is normal.     Breath sounds: Normal breath sounds.  Musculoskeletal:        General: Normal range of motion.  Skin:    General: Skin is warm and dry.  Neurological:     General: No focal deficit present.     Mental Status: She is oriented to person, place, and time.  Psychiatric:        Mood and Affect: Mood normal.        Behavior: Behavior normal.        Thought Content: Thought content normal.        Judgment: Judgment normal.       Assessment & Plan:  1. Type 2 diabetes mellitus with hyperglycemia, without long-term current use of insulin (HCC)  - POC HgB A1c- 6.3. has improved - Continue with lifestyle modifications and metformin 500 mg ER daily  - Follow up in 3 months  - Microalbumin/Creatinine Ratio, Urine; Future  2. Hyperlipidemia, unspecified hyperlipidemia type - Advised that my recommendation no matter what her lipid panel shows is to still start a statin  - Lipid panel; Future  3.  Encounter for screening for HIV  - HIV Antibody (routine testing w rflx); Future - RPR; Future - Urine cytology ancillary only; Future  4. Heat syncope, initial encounter  - Ambulatory referral to Cardiology  , NP

## 2022-05-11 NOTE — Telephone Encounter (Signed)
Please advise 

## 2022-05-17 ENCOUNTER — Ambulatory Visit: Payer: 59 | Admitting: Dietician

## 2022-05-17 ENCOUNTER — Encounter: Payer: Self-pay | Admitting: Adult Health

## 2022-05-18 ENCOUNTER — Encounter: Payer: Self-pay | Admitting: Adult Health

## 2022-05-18 ENCOUNTER — Telehealth: Payer: 59 | Admitting: Adult Health

## 2022-05-18 ENCOUNTER — Other Ambulatory Visit: Payer: 59

## 2022-05-18 ENCOUNTER — Other Ambulatory Visit (HOSPITAL_COMMUNITY)
Admission: RE | Admit: 2022-05-18 | Discharge: 2022-05-18 | Disposition: A | Discharge status: Home / Self Care | Payer: 59 | Source: Ambulatory Visit | Attending: Adult Health | Admitting: Adult Health

## 2022-05-18 DIAGNOSIS — Z114 Encounter for screening for human immunodeficiency virus [HIV]: Secondary | ICD-10-CM | POA: Diagnosis present

## 2022-05-18 DIAGNOSIS — E1165 Type 2 diabetes mellitus with hyperglycemia: Secondary | ICD-10-CM

## 2022-05-18 DIAGNOSIS — E785 Hyperlipidemia, unspecified: Secondary | ICD-10-CM

## 2022-05-18 LAB — LIPID PANEL
Cholesterol: 215 mg/dL — ABNORMAL HIGH (ref 0–200)
HDL: 44.5 mg/dL (ref >39.00)
LDL Cholesterol: 154 mg/dL — ABNORMAL HIGH (ref 0–99)
NonHDL: 170.26
Total CHOL/HDL Ratio: 5
Triglycerides: 82 mg/dL (ref 0.0–149.0)
VLDL: 16.4 mg/dL (ref 0.0–40.0)

## 2022-05-18 LAB — MICROALBUMIN / CREATININE URINE RATIO
Creatinine,U: 152.4 mg/dL
Microalb Creat Ratio: 0.5 mg/g (ref 0.0–30.0)
Microalb, Ur: <0.7 mg/dL (ref 0.0–1.9)

## 2022-05-18 NOTE — Addendum Note (Signed)
Addended by: Elita Boone E on: 05/18/2022 07:57 AM   Modules accepted: Orders

## 2022-05-18 NOTE — Telephone Encounter (Signed)
Pt notified of labs

## 2022-05-19 LAB — URINE CYTOLOGY ANCILLARY ONLY
Chlamydia: NEGATIVE
Comment: NEGATIVE
Comment: NEGATIVE
Comment: NORMAL
Neisseria Gonorrhea: NEGATIVE
Trichomonas: NEGATIVE

## 2022-05-19 LAB — HIV ANTIBODY (ROUTINE TESTING W REFLEX): HIV 1&2 Ab, 4th Generation: NONREACTIVE

## 2022-05-19 LAB — RPR: RPR Ser Ql: NONREACTIVE

## 2022-06-04 ENCOUNTER — Telehealth: Payer: Self-pay

## 2022-06-04 ENCOUNTER — Encounter: Payer: Self-pay | Admitting: Cardiology

## 2022-06-04 ENCOUNTER — Ambulatory Visit: Payer: 59 | Attending: Cardiology

## 2022-06-04 ENCOUNTER — Ambulatory Visit: Payer: 59 | Attending: Cardiology | Admitting: Cardiology

## 2022-06-04 VITALS — BP 100/80 | HR 100 | Ht 69.75 in | Wt 180.0 lb

## 2022-06-04 DIAGNOSIS — E282 Polycystic ovarian syndrome: Secondary | ICD-10-CM

## 2022-06-04 DIAGNOSIS — R55 Syncope and collapse: Secondary | ICD-10-CM

## 2022-06-04 NOTE — Telephone Encounter (Signed)
Pt was advised to cut back or discontinue Spironolactone. Pt wanted to know if there was an alternative she could take. States this may be related to recent medical issues. Pt advised she could explain more in depth if needed.

## 2022-06-04 NOTE — Patient Instructions (Signed)
Medication Instructions:  The current medical regimen is effective;  continue present plan and medications.  *If you need a refill on your cardiac medications before your next appointment, please call your pharmacy*  Testing/Procedures: Your physician has requested that you have an echocardiogram. Echocardiography is a painless test that uses sound waves to create images of your heart. It provides your doctor with information about the size and shape of your heart and how well your heart's chambers and valves are working. This procedure takes approximately one hour. There are no restrictions for this procedure. Please do NOT wear cologne, perfume, aftershave, or lotions (deodorant is allowed). Please arrive 15 minutes prior to your appointment time.  ZIO XT- Long Term Monitor Instructions  Your physician has requested you wear a ZIO patch monitor for 14 days.  This is a single patch monitor. [employer] supplies one patch monitor per enrollment. Additional stickers are not available. Please do not apply patch if you will be having a Nuclear Stress Test,  Echocardiogram, Cardiac CT, MRI, or Chest Xray during the period you would be wearing the  monitor. The patch cannot be worn during these tests. You cannot remove and re-apply the  ZIO XT patch monitor.  Your ZIO patch monitor will be mailed 3 day USPS to your address on file. It may take 3-5 days  to receive your monitor after you have been enrolled.  Once you have received your monitor, please review the enclosed instructions. Your monitor  has already been registered assigning a specific monitor serial # to you.  Billing and Patient Assistance Program Information  We have supplied [employer] with any of your insurance information on file for billing purposes. [employer] offers a sliding scale Patient Assistance Program for patients that do not have  insurance, or whose insurance does not completely cover the cost of the ZIO monitor.  You must  apply for the Patient Assistance Program to qualify for this discounted rate.  To apply, please call [employer] at [phone], select option 4, select option 2, ask to apply for  Patient Assistance Program. Theodore Demark will ask your household income, and how many people  are in your household. They will quote your out-of-pocket cost based on that information.  [employer] will also be able to set up a 78-month interest-free payment plan if needed.  Applying the monitor   Shave hair from upper left chest.  Hold abrader disc by orange tab. Rub abrader in 40 strokes over the upper left chest as  indicated in your monitor instructions.  Clean area with 4 enclosed alcohol pads. Let dry.  Apply patch as indicated in monitor instructions. Patch will be placed under collarbone on left  side of chest with arrow pointing upward.  Rub patch adhesive wings for 2 minutes. Remove white label marked "1". Remove the white  label marked "2". Rub patch adhesive wings for 2 additional minutes.  While looking in a mirror, press and release button in center of patch. A small green light will  flash 3-4 times. This will be your only indicator that the monitor has been turned on.  Do not shower for the first 24 hours. You may shower after the first 24 hours.  Press the button if you feel a symptom. You will hear a small click. Record Date, Time and  Symptom in the Patient Logbook.  When you are ready to remove the patch, follow instructions on the last 2 pages of Patient  Logbook. Stick patch monitor onto the last page  of Patient Logbook.  Place Patient Logbook in the blue and white box. Use locking tab on box and tape box closed  securely. The blue and white box has prepaid postage on it. Please place it in the mailbox as  soon as possible. Your physician should have your test results approximately 7 days after the  monitor has been mailed back to [facility].  Call [facility] Customer Care at [phone] if  you have questions regarding  your ZIO XT patch monitor. Call them immediately if you see an orange light blinking on your  monitor.  If your monitor falls off in less than 4 days, contact our Monitor department at [phone].  If your monitor becomes loose or falls off after 4 days call [employer] at [phone] for  suggestions on securing your monitor   Follow-Up: At [facility], you and your health needs are our priority.  As part of our continuing mission to provide you with exceptional heart care, we have created designated Provider Care Teams.  These Care Teams include your primary Cardiologist (physician) and Advanced Practice Providers (APPs -  Physician Assistants and Nurse Practitioners) who all work together to provide you with the care you need, when you need it.  We recommend signing up for the patient portal called "MyChart".  Sign up information is provided on this After Visit Summary.  MyChart is used to connect with patients for Virtual Visits (Telemedicine).  Patients are able to view lab/test results, encounter notes, upcoming appointments, etc.  Non-urgent messages can be sent to your provider as well.   To learn more about what you can do with MyChart, go to [email or web address].    Your next appointment:   6 month(s)  The format for your next appointment:   In Person  Provider:   Donato Schultz, MD     Important Information About Sugar

## 2022-06-04 NOTE — Progress Notes (Unsigned)
Enrolled patient for a 14 day Zio XT  monitor to be mailed to patients home  °

## 2022-06-04 NOTE — Progress Notes (Signed)
Cardiology Office Note:    Date:  06/04/2022   ID:  Krystyna Cleckley, DOB [birth year], MRN [id number]  PCP:  Shirline Frees, NP   [facility] Providers Cardiologist:  Donato Schultz, MD     Referring MD: Shirline Frees, NP    History of Present Illness:    Danie Leslee Home is a 46 y.o. female here for the evaluation of syncope at the request of Shirline Frees, NP.  At her first episode in March 2021 and then a second 1 in November 2021.  Third 1 happened in February 2022.  She feels as though her vision goes dark feels hot and faints.  Usually has been hot during the events.  During 1 or maybe even more of the events she was exercising.  Prior EKG has been unremarkable.  If can get to fan and lay still then she is fine. Nausea, Vomit. 5 times prior to EMS. Hot sweats then cold.   Vision would go when lady came and talked to her emotional at prior job.   On treadmill vision started to go, left eye.   Had episodes prior to spironolactone for PCOS. Imcreased stress.   Both mother and sister have heart murmurs.  Uncle had open heart surgery  Past Medical History:  Diagnosis Date   Anxiety    R/T dx   Asthma    dx 2003=1st asthma attack 10/2021   Diabetes mellitus without complication (HCC)    on meds   HPV in female    Hyperlipidemia    diet controlled/exercise   Migraine    No blood products    Polycystic ovary syndrome    Seasonal allergies    Thyroid disease    on meds    Past Surgical History:  Procedure Laterality Date   EXCISION VAGINAL CYST     removal of genital warts   WISDOM TOOTH EXTRACTION      Current Medications: Current Meds  Medication Sig   Acetaminophen (TYLENOL 8 HOUR PO) Take 1 tablet by mouth every 4 (four) hours as needed.   albuterol (VENTOLIN HFA) 108 (90 Base) MCG/ACT inhaler Inhale 1-2 puffs into the lungs every 6 (six) hours as needed for wheezing or shortness of breath.   buPROPion (WELLBUTRIN XL) 150 MG 24 hr tablet  Take 1 tablet (150 mg total) by mouth daily.   Continuous Blood Gluc Sensor (FREESTYLE LIBRE 3 SENSOR) MISC 1 Device by Does not apply route every 14 (fourteen) days. Place 1 sensor on the skin every 14 days. Use to check glucose continuously   ELINEST 0.3-30 MG-MCG tablet Take 1 tablet by mouth daily.   Ferrous Sulfate (IRON PO) Take 1 tablet by mouth daily at 6 (six) AM. Up to 2 tabs depending on blood loss level   fluticasone (FLONASE) 50 MCG/ACT nasal spray Place 2 sprays into both nostrils daily.   levothyroxine (SYNTHROID) 137 MCG tablet Take 1 tablet (137 mcg total) by mouth as directed. Two tabs on Sundays and 1 tab rest of the week   metFORMIN (GLUCOPHAGE-XR) 500 MG 24 hr tablet Take 1 tablet (500 mg total) by mouth 2 (two) times daily with a meal.   ondansetron (ZOFRAN-ODT) 4 MG disintegrating tablet 4mg  ODT q4 hours prn nausea/vomit   spironolactone (ALDACTONE) 50 MG tablet Take 1 tablet (50 mg total) by mouth 2 (two) times daily.   valACYclovir (VALTREX) 500 MG tablet TAKE 1 CAPLET BY MOUTH TWICE DAILY AS NEEDED     Allergies:  Allegra [fexofenadine hcl], Sulfa antibiotics, and Aspirin   Social History   Socioeconomic History   Marital status: Single    Spouse name: Not on file   Number of children: Not on file   Years of education: Not on file   Highest education level: Not on file  Occupational History   Not on file  Tobacco Use   Smoking status: Former    Types: Cigarettes    Quit date: 01/30/2022    Years since quitting: 0.3   Smokeless tobacco: Never  Vaping Use   Vaping Use: Never used  Substance and Sexual Activity   Alcohol use: Not Currently    Alcohol/week: 4.0 - 6.0 standard drinks of alcohol    Types: 2 - 3 Glasses of wine, 2 - 3 Shots of liquor per week    Comment: Socially   Drug use: No   Sexual activity: Yes    Birth control/protection: Pill  Other Topics Concern   Not on file  Social History Narrative   Not on file   Social Determinants of  Health   Financial Resource Strain: Not on file  Food Insecurity: Not on file  Transportation Needs: Not on file  Physical Activity: Not on file  Stress: Not on file  Social Connections: Not on file     Family History: The patient's family history includes Breast cancer in her paternal aunt and paternal grandmother; Cancer in her father; Diabetes in her mother; Hyperlipidemia in her mother; Hypertension in her mother and sister. There is no history of Colon polyps, Colon cancer, Esophageal cancer, Rectal cancer, or Stomach cancer.  ROS:   Please see the history of present illness.     All other systems reviewed and are negative.  EKGs/Labs/Other Studies Reviewed:    The following studies were reviewed today: ER notes reviewed chest x-ray EKGs lab work extensive review.  EKG:  The ekg ordered today demonstrates sinus rhythm/mild sinus tachycardia no other abnormalities normal intervals normal QT.  Recent Labs: 04/02/2022: TSH 0.15 04/19/2022: ALT 21; BUN 12; Creatinine, Ser 0.82; Hemoglobin 11.4; Magnesium 2.0; Platelets 382; Potassium 4.0; Sodium 138  Recent Lipid Panel    Component Value Date/Time   CHOL 215 (H) 05/18/2022 0757   TRIG 82.0 05/18/2022 0757   HDL 44.50 05/18/2022 0757   CHOLHDL 5 05/18/2022 0757   VLDL 16.4 05/18/2022 0757   LDLCALC 154 (H) 05/18/2022 0757   LDLCALC 128 (H) 02/05/2020 1358     Risk Assessment/Calculations:               Physical Exam:    VS:  BP 100/80 (BP Location: Left Arm, Patient Position: Sitting, Cuff Size: Normal)   Pulse 100   Ht 5' 9.75" (1.772 m)   Wt 180 lb (81.6 kg)   SpO2 98%   BMI 26.01 kg/m     Wt Readings from Last 3 Encounters:  06/04/22 180 lb (81.6 kg)  05/11/22 182 lb (82.6 kg)  04/19/22 178 lb (80.7 kg)     GEN:  Well nourished, well developed in no acute distress HEENT: Normal NECK: No JVD; No carotid bruits LYMPHATICS: No lymphadenopathy CARDIAC: RRR, no murmurs, rubs, gallops RESPIRATORY:   Clear to auscultation without rales, wheezing or rhonchi  ABDOMEN: Soft, non-tender, non-distended MUSCULOSKELETAL:  No edema; No deformity  SKIN: Warm and dry NEUROLOGIC:  Alert and oriented x 3 PSYCHIATRIC:  Normal affect   ASSESSMENT:    1. Syncope and collapse   2. PCOS (polycystic ovarian syndrome)  PLAN:    In order of problems listed above:  Syncope - By historical account, sounds like vasovagal syncope.  Classic prodrome.  Hot, sweaty diaphoretic nausea laying down seems to terminated. - We will place a Zio patch monitor to ensure that she does not have any evidence of dangerous arrhythmias - Salt liberalization fluid liberalization.  Blood pressure is low to begin with. - She does take spironolactone 50 mg twice a day for PCOS.  This has helped her out considerably with body hair etc.  I think that this is also combating this issue even though she has had syncopal episodes prior to the initiation of this medication.  It may be worth it to cut this back to 25 twice a day and then may be 25 once a day and potentially eventually stopped to see how she feels without it.  She has had a 15 pound weight loss as well with exercise and this will also lower her blood pressure.  May wish to speak to her prescribing physician about this.  EKG appears unremarkable.  No adverse changes. -We will check an echocardiogram to ensure proper structure and function of her heart.  Certainly needs to be extremely careful on her motorcycle.  Hypothyroidism - Continue with Synthroid.  Continue close          Medication Adjustments/Labs and Tests Ordered: Current medicines are reviewed at length with the patient today.  Concerns regarding medicines are outlined above.  Orders Placed This Encounter  Procedures   LONG TERM MONITOR (3-14 DAYS)   ECHOCARDIOGRAM COMPLETE   No orders of the defined types were placed in this encounter.   Patient Instructions  Medication Instructions:  The  current medical regimen is effective;  continue present plan and medications.  *If you need a refill on your cardiac medications before your next appointment, please call your pharmacy*  Testing/Procedures: Your physician has requested that you have an echocardiogram. Echocardiography is a painless test that uses sound waves to create images of your heart. It provides your doctor with information about the size and shape of your heart and how well your heart's chambers and valves are working. This procedure takes approximately one hour. There are no restrictions for this procedure. Please do NOT wear cologne, perfume, aftershave, or lotions (deodorant is allowed). Please arrive 15 minutes prior to your appointment time.  ZIO XT- Long Term Monitor Instructions  Your physician has requested you wear a ZIO patch monitor for 14 days.  This is a single patch monitor. [employer] supplies one patch monitor per enrollment. Additional stickers are not available. Please do not apply patch if you will be having a Nuclear Stress Test,  Echocardiogram, Cardiac CT, MRI, or Chest Xray during the period you would be wearing the  monitor. The patch cannot be worn during these tests. You cannot remove and re-apply the  ZIO XT patch monitor.  Your ZIO patch monitor will be mailed 3 day USPS to your address on file. It may take 3-5 days  to receive your monitor after you have been enrolled.  Once you have received your monitor, please review the enclosed instructions. Your monitor  has already been registered assigning a specific monitor serial # to you.  Billing and Patient Assistance Program Information  We have supplied [employer] with any of your insurance information on file for billing purposes. [employer] offers a sliding scale Patient Assistance Program for patients that do not have  insurance, or whose insurance does not completely cover  the cost of the ZIO monitor.  You must apply for the Patient Assistance  Program to qualify for this discounted rate.  To apply, please call [employer] at [phone], select option 4, select option 2, ask to apply for  Patient Assistance Program. Meredeth Ide will ask your household income, and how many people  are in your household. They will quote your out-of-pocket cost based on that information.  [employer] will also be able to set up a 75-month, interest-free payment plan if needed.  Applying the monitor   Shave hair from upper left chest.  Hold abrader disc by orange tab. Rub abrader in 40 strokes over the upper left chest as  indicated in your monitor instructions.  Clean area with 4 enclosed alcohol pads. Let dry.  Apply patch as indicated in monitor instructions. Patch will be placed under collarbone on left  side of chest with arrow pointing upward.  Rub patch adhesive wings for 2 minutes. Remove white label marked "1". Remove the white  label marked "2". Rub patch adhesive wings for 2 additional minutes.  While looking in a mirror, press and release button in center of patch. A small green light will  flash 3-4 times. This will be your only indicator that the monitor has been turned on.  Do not shower for the first 24 hours. You may shower after the first 24 hours.  Press the button if you feel a symptom. You will hear a small click. Record Date, Time and  Symptom in the Patient Logbook.  When you are ready to remove the patch, follow instructions on the last 2 pages of Patient  Logbook. Stick patch monitor onto the last page of Patient Logbook.  Place Patient Logbook in the blue and white box. Use locking tab on box and tape box closed  securely. The blue and white box has prepaid postage on it. Please place it in the mailbox as  soon as possible. Your physician should have your test results approximately 7 days after the  monitor has been mailed back to [facility].  Call [facility] Customer Care at [phone] if you have questions regarding   your ZIO XT patch monitor. Call them immediately if you see an orange light blinking on your  monitor.  If your monitor falls off in less than 4 days, contact our Monitor department at [phone].  If your monitor becomes loose or falls off after 4 days call [employer] at [phone] for  suggestions on securing your monitor   Follow-Up: At [facility], you and your health needs are our priority.  As part of our continuing mission to provide you with exceptional heart care, we have created designated Provider Care Teams.  These Care Teams include your primary Cardiologist (physician) and Advanced Practice Providers (APPs -  Physician Assistants and Nurse Practitioners) who all work together to provide you with the care you need, when you need it.  We recommend signing up for the patient portal called "MyChart".  Sign up information is provided on this After Visit Summary.  MyChart is used to connect with patients for Virtual Visits (Telemedicine).  Patients are able to view lab/test results, encounter notes, upcoming appointments, etc.  Non-urgent messages can be sent to your provider as well.   To learn more about what you can do with MyChart, go to [email or web address].    Your next appointment:   6 month(s)  The format for your next appointment:   In Person  Provider:   Donato Schultz,  MD     Important Information About Sugar         Signed, Donato Schultz, MD  06/04/2022 3:38 PM    [facility]

## 2022-06-05 NOTE — Telephone Encounter (Signed)
I reviewed her chart >> needs to stay off Spironolactone. Would not suggest an alternative for now.

## 2022-06-07 NOTE — Telephone Encounter (Signed)
Called and lvm for pt to advise stay off Spironolactone and no alternative suggested at this time.

## 2022-06-20 ENCOUNTER — Telehealth: Payer: 59 | Admitting: Nurse Practitioner

## 2022-06-20 DIAGNOSIS — J069 Acute upper respiratory infection, unspecified: Secondary | ICD-10-CM | POA: Diagnosis not present

## 2022-06-20 MED ORDER — PREDNISONE 20 MG PO TABS: 20.0000 mg | ORAL_TABLET | Freq: Every day | ORAL | 0 refills | Status: DC

## 2022-06-20 MED ORDER — BENZONATATE 100 MG PO CAPS: 100.0000 mg | ORAL_CAPSULE | Freq: Two times a day (BID) | ORAL | 0 refills | Status: DC | PRN

## 2022-06-20 MED ORDER — FLUTICASONE PROPIONATE 50 MCG/ACT NA SUSP: 2.0000 | Freq: Every day | NASAL | 0 refills | Status: DC

## 2022-06-20 NOTE — Progress Notes (Signed)
We are sorry that you are not feeling well.  Here is how we plan to help!  Based on your presentation I believe you most likely have A cough due to a virus.  This is called viral bronchitis and is best treated by rest, plenty of fluids and control of the cough.  You may use Ibuprofen or Tylenol as directed to help your symptoms.     In addition you may use A prescription cough medication called Tessalon Perles 100mg . You may take 1-2 capsules every 8 hours as needed for your cough.  Prednisone 20mg  for 3 days and a nose spray called fluticasone.   From your responses in the eVisit questionnaire you describe inflammation in the upper respiratory tract which is causing a significant cough.  This is commonly called Bronchitis and has four common causes:   Allergies Viral Infections Acid Reflux Bacterial Infection Allergies, viruses and acid reflux are treated by controlling symptoms or eliminating the cause. An example might be a cough caused by taking certain blood pressure medications. You stop the cough by changing the medication. Another example might be a cough caused by acid reflux. Controlling the reflux helps control the cough.  USE OF BRONCHODILATOR ("RESCUE") INHALERS: There is a risk from using your bronchodilator too frequently.  The risk is that over-reliance on a medication which only relaxes the muscles surrounding the breathing tubes can reduce the effectiveness of medications prescribed to reduce swelling and congestion of the tubes themselves.  Although you feel brief relief from the bronchodilator inhaler, your asthma may actually be worsening with the tubes becoming more swollen and filled with mucus.  This can delay other crucial treatments, such as oral steroid medications. If you need to use a bronchodilator inhaler daily, several times per day, you should discuss this with your provider.  There are probably better treatments that could be used to keep your asthma under control.      HOME CARE Only take medications as instructed by your medical team. Complete the entire course of an antibiotic. Drink plenty of fluids and get plenty of rest. Avoid close contacts especially the very young and the elderly Cover your mouth if you cough or cough into your sleeve. Always remember to wash your hands A steam or ultrasonic humidifier can help congestion.   GET HELP RIGHT AWAY IF: You develop worsening fever. You become short of breath You cough up blood. Your symptoms persist after you have completed your treatment plan MAKE SURE YOU  Understand these instructions. Will watch your condition. Will get help right away if you are not doing well or get worse.    Thank you for choosing an e-visit.  Your e-visit answers were reviewed by a board certified advanced clinical practitioner to complete your personal care plan. Depending upon the condition, your plan could have included both over the counter or prescription medications.  Please review your pharmacy choice. Make sure the pharmacy is open so you can pick up prescription now. If there is a problem, you may contact your provider through and have the prescription routed to another pharmacy.  Your safety is important to . If you have drug allergies check your prescription carefully.   For the next 24 hours you can use MyChart to ask questions about today's visit, request a non-urgent call back, or ask for a work or school excuse. You will get an email in the next two days asking about your experience. I hope that your e-visit has  been valuable and will speed your recovery.

## 2022-06-20 NOTE — Progress Notes (Signed)
I have spent 5 minutes in review of e-visit questionnaire, review and updating patient chart, medical decision making and response to patient.  ° °Jerene Yeager W Celestina Gironda, NP ° °  °

## 2022-06-25 ENCOUNTER — Telehealth: Payer: Self-pay | Admitting: Cardiology

## 2022-06-25 NOTE — Telephone Encounter (Signed)
LMTCB w/ Cala Bradford at [facility]. Called patient as order for long term monitor was placed 12/8 and no results in chart. Patient states she has not put it on yet due to travel and cough due to bronchitis. She states she is supposed to have a D&C on 07/05/21 with [facility]. Patient states she will call [employer] and see if she can start the test today as she is aware she also has her Echo in February.   Cala Bradford at [place] calling back, states anesthesiology dept at [place] would like [place] and Echo results before procedure. Relayed that results were not in yet and Echo scheduled for Feb. Cala Bradford stated she would call patient to discuss surgical clearance further.

## 2022-06-25 NOTE — Telephone Encounter (Signed)
Caller stated patient is being scheduled for surgery and she is following-up on results of patient's heart monitor test.

## 2022-06-25 NOTE — Telephone Encounter (Signed)
Patient is following up. She would like to know if it would be alright for her to wait until after 1/08 procedure with [facility] to place her heart monitor. Please advise. Patient is hopeful for a call back this afternoon if at all possible.

## 2022-06-25 NOTE — Telephone Encounter (Signed)
Patient calling back in to ask if she can delay placing her heart monitor so she can proceed with surgery on 07/05/21. Advised patient that [facility] may reschedule this procedure so they can see results of heart monitor and echo prior to giving general anesthesia. Gave phone number for [facility] w/ [facility].

## 2022-06-29 ENCOUNTER — Other Ambulatory Visit: Payer: Self-pay | Admitting: Adult Health

## 2022-06-29 DIAGNOSIS — E1169 Type 2 diabetes mellitus with other specified complication: Secondary | ICD-10-CM

## 2022-06-30 ENCOUNTER — Encounter: Payer: Self-pay | Admitting: Cardiology

## 2022-06-30 ENCOUNTER — Telehealth: Payer: Self-pay | Admitting: Cardiology

## 2022-06-30 NOTE — Telephone Encounter (Signed)
STAT if patient feels like he/she is going to faint   Are you dizzy now? No at this time- she was earlier  Do you feel faint or have you passed out? no  Do you have any other symptoms? Blood pressure was up, blurred vision, felt hot- she called EMS- she have been wearing a monitor and been itching so much-- monitor buckling from her skin- after working on the monitor, she had the dizzy spell and frlt nauseated  Have you checked your HR and BP (record if available)? 140/90 was her blood pressure

## 2022-06-30 NOTE — Telephone Encounter (Signed)
Called to speak with pt regarding her concerns.  Pt crying and states she has been crying for an hour because she is scared of the way she was feeling and what has been going to with her body.  She did place the monitor on Monday night on sunburned skin and the itching started immediately.  It continues to itch and appears that the "monitor part" is not on her skin.  She also reports having an upper respiratory infection since 12/18.  She has been taking over the counter medications including Mucinex, just finished RX for prednisone yesterday though did not state why she had been taking it.  Today, she started feeling much like she did in the past when she had syncope.  She started getting hot and had blurred vision.  She was scared and crying, laid with legs elevated and the fan blowing on her.  Mother brought her water.  She called EMS to check her out.  She reports the only thing abnormal was her BP (140/90).  She reports she has been eating A LOT of ham the last several weeks.  She does not have a way to recheck this at home.  Advised to obtain a BP monitor to use at home if possible.  Advised pt to remove monitor and return it if the itching is unbearable.  Advised to make sure she is staying well hydrated and avoiding OTC medication for treatment of cold s/s.  Advised to f/u with PCP or UrgentCare for further evaluation of this.  Advised to call back if further s/s and any concerns.  Pt was not crying at the end of conversation and seemed to feel reassured and agreeable to advise given.  Will forward to Dr Marlou Porch for knowledge.

## 2022-07-01 ENCOUNTER — Encounter: Payer: Self-pay | Admitting: Adult Health

## 2022-07-01 ENCOUNTER — Ambulatory Visit: Payer: 59 | Admitting: Adult Health

## 2022-07-01 VITALS — BP 100/70 | HR 93 | Temp 98.1°F | Ht 69.75 in | Wt 180.0 lb

## 2022-07-01 DIAGNOSIS — J40 Bronchitis, not specified as acute or chronic: Secondary | ICD-10-CM

## 2022-07-01 DIAGNOSIS — R55 Syncope and collapse: Secondary | ICD-10-CM

## 2022-07-01 MED ORDER — PREDNISONE 10 MG PO TABS: ORAL_TABLET | ORAL | 0 refills | Status: DC

## 2022-07-01 NOTE — Progress Notes (Signed)
Subjective:    Patient ID: Danielle [name], female    DOB: 05/09/1976, 47 y.o.   MRN: [id number]  HPI 47 year old female who is being evaluated today for presyncopal episode.  She reports yesterday she was at work( works from home) and became hot, had noticed with vision loss.  She laid down on the floor elevated her legs and EMS was called.  EMS got there they said that her blood pressure was 140/90, blood sugar was in the 120s.  Was advised to hydrate.  After about 10 minutes her symptoms improved and she returned to baseline.  Reports that this is the worst episode that she has had.  Unfortunately she was unable to complete her Zio patch, when she put it on at the beginning of last week she had an allergic reaction.  Additionally, she reports that the day before Christmas she virtual visit for an acute cough and was prescribed 3 days of prednisone and Tessalon Perles.  She reports that she continues to have a cough and wheezing.  Tessalon Perles have not done much to help her cough.   Review of Systems See HPI   Past Medical History:  Diagnosis Date   Anxiety    R/T dx   Asthma    dx 2003=1st asthma attack 10/2021   Diabetes mellitus without complication (HCC)    on meds   HPV in female    Hyperlipidemia    diet controlled/exercise   Migraine    No blood products    Polycystic ovary syndrome    Seasonal allergies    Thyroid disease    on meds    Social History   Socioeconomic History   Marital status: Single    Spouse name: Not on file   Number of children: Not on file   Years of education: Not on file   Highest education level: Not on file  Occupational History   Not on file  Tobacco Use   Smoking status: Former    Types: Cigarettes    Quit date: 01/30/2022    Years since quitting: 0.4   Smokeless tobacco: Never  Vaping Use   Vaping Use: Never used  Substance and Sexual Activity   Alcohol use: Not Currently    Alcohol/week: 4.0 - 6.0 standard drinks of  alcohol    Types: 2 - 3 Glasses of wine, 2 - 3 Shots of liquor per week    Comment: Socially   Drug use: No   Sexual activity: Yes    Birth control/protection: Pill  Other Topics Concern   Not on file  Social History Narrative   Not on file   Social Determinants of Health   Financial Resource Strain: Not on file  Food Insecurity: Not on file  Transportation Needs: Not on file  Physical Activity: Not on file  Stress: Not on file  Social Connections: Not on file  Intimate Partner Violence: Not on file    Past Surgical History:  Procedure Laterality Date   EXCISION VAGINAL CYST     removal of genital warts   WISDOM TOOTH EXTRACTION      Family History  Problem Relation Age of Onset   Hyperlipidemia Mother    Hypertension Mother    Diabetes Mother    Cancer Father        lung tumor/ lung removed   Hypertension Sister    Breast cancer Paternal Aunt    Breast cancer Paternal Grandmother    Colon polyps Neg Hx  Colon cancer Neg Hx    Esophageal cancer Neg Hx    Rectal cancer Neg Hx    Stomach cancer Neg Hx     Allergies  Allergen Reactions   Allegra [Fexofenadine Hcl]     Swelling occurs   Sulfa Antibiotics    Aspirin Rash    Baby asprin; as a child, rash from dyes     Current Outpatient Medications on File Prior to Visit  Medication Sig Dispense Refill   Acetaminophen (TYLENOL 8 HOUR PO) Take 1 tablet by mouth every 4 (four) hours as needed.     albuterol (VENTOLIN HFA) 108 (90 Base) MCG/ACT inhaler Inhale 1-2 puffs into the lungs every 6 (six) hours as needed for wheezing or shortness of breath. 1 each 1   buPROPion (WELLBUTRIN XL) 150 MG 24 hr tablet Take 1 tablet (150 mg total) by mouth daily. 90 tablet 1   Continuous Blood Gluc Sensor (FREESTYLE LIBRE 3 SENSOR) MISC PLACE ONE SENSOR ONTO THE SKIN EVERY 14 DAYS 2 each 0   ELINEST 0.3-30 MG-MCG tablet Take 1 tablet by mouth daily.     Ferrous Sulfate (IRON PO) Take 1 tablet by mouth daily at 6 (six) AM. Up  to 2 tabs depending on blood loss level     fluticasone (FLONASE) 50 MCG/ACT nasal spray Place 2 sprays into both nostrils daily. 16 g 0   levothyroxine (SYNTHROID) 137 MCG tablet Take 1 tablet (137 mcg total) by mouth as directed. Two tabs on Sundays and 1 tab rest of the week 96 tablet 3   metFORMIN (GLUCOPHAGE-XR) 500 MG 24 hr tablet Take 1 tablet (500 mg total) by mouth 2 (two) times daily with a meal. 180 tablet 0   ondansetron (ZOFRAN-ODT) 4 MG disintegrating tablet 4mg  ODT q4 hours prn nausea/vomit 20 tablet 0   spironolactone (ALDACTONE) 50 MG tablet Take 1 tablet (50 mg total) by mouth 2 (two) times daily. 180 tablet 3   valACYclovir (VALTREX) 500 MG tablet TAKE 1 CAPLET BY MOUTH TWICE DAILY AS NEEDED     No current facility-administered medications on file prior to visit.    BP 100/70   Pulse 93   Temp 98.1 F (36.7 C) (Oral)   Ht 5' 9.75" (1.772 m)   Wt 180 lb (81.6 kg)   SpO2 99%   BMI 26.01 kg/m       Objective:   Physical Exam Vitals and nursing note reviewed.  Constitutional:      Appearance: Normal appearance.  Cardiovascular:     Rate and Rhythm: Normal rate and regular rhythm.     Pulses: Normal pulses.     Heart sounds: Normal heart sounds.  Pulmonary:     Effort: Pulmonary effort is normal.     Breath sounds: Wheezing (trace expiratory wheeze throughout lung fields) present.  Musculoskeletal:        General: Normal range of motion.  Skin:    General: Skin is warm and dry.  Neurological:     General: No focal deficit present.     Mental Status: She is alert and oriented to person, place, and time.  Psychiatric:        Mood and Affect: Mood normal.        Behavior: Behavior normal.        Thought Content: Thought content normal.        Judgment: Judgment normal.       Assessment & Plan:  1. Pre-syncope -She has notified cardiology about the  allergic reaction to the adhesive on the ZIO patch.  She has FMLA she needs filled out and I will fill this  out as soon as I can.  She was advised to follow-up with cardiology.  Encouraged staying hydrated and taking it easy for the next few days  2. Bronchitis  - predniSONE (DELTASONE) 10 MG tablet; 40 mg x 3 days, 20 mg x 3 days, 10 mg x 3 days  Dispense: 21 tablet; Refill: 0  Time spent with patient today was 32 minutes which consisted of chart review, discussing pre syncope and bronchitis,  work up, treatment answering questions and documentation.  Dorothyann Peng, NP

## 2022-07-02 ENCOUNTER — Encounter: Payer: Self-pay | Admitting: Adult Health

## 2022-07-06 ENCOUNTER — Encounter: Payer: Self-pay | Admitting: Adult Health

## 2022-07-06 ENCOUNTER — Encounter: Payer: Self-pay | Admitting: Cardiology

## 2022-07-06 ENCOUNTER — Ambulatory Visit: Payer: 59 | Admitting: Adult Health

## 2022-07-06 NOTE — Telephone Encounter (Signed)
FYI

## 2022-07-06 NOTE — Telephone Encounter (Signed)
Please advise 

## 2022-07-07 NOTE — Telephone Encounter (Signed)

## 2022-07-08 ENCOUNTER — Telehealth: Payer: Self-pay | Admitting: Cardiology

## 2022-07-08 ENCOUNTER — Telehealth: Payer: Self-pay | Admitting: *Deleted

## 2022-07-08 DIAGNOSIS — R55 Syncope and collapse: Secondary | ICD-10-CM

## 2022-07-08 NOTE — Telephone Encounter (Signed)
New message   Called pt about EP referral for ILR. She said that she believes the irritation from the [place] was because her skin was sunburnt from vacation and trying to heal. She would like to retry the Zio if that is an option. She said her skin would be back to normal in about a week. Please call.

## 2022-07-08 NOTE — Telephone Encounter (Signed)
Thank you for your message seeking medical advice.* My assessment and recommendation are as follows: We will send you to EP, my electrical cardiologist partners in the clinic, to discuss the possibility of implantable loop recorder. I am sorry you had such trouble with skin irritation with our traditional monitors.  Our office will be in touch with you. Thanks.    Sincerely,  Candee Furbish, MD

## 2022-07-08 NOTE — Telephone Encounter (Signed)
Will have Dr Marlou Porch to review for order.

## 2022-07-09 NOTE — Telephone Encounter (Signed)
Spoke with pt.  She will come into the office 1/23 at 4pm for zio to be placed.  Katy aware.

## 2022-07-09 NOTE — Telephone Encounter (Signed)
Reviewed with Dr Marlou Porch who advises OK to repeat ZIO.

## 2022-07-12 ENCOUNTER — Other Ambulatory Visit (HOSPITAL_COMMUNITY): Payer: 59

## 2022-07-13 ENCOUNTER — Other Ambulatory Visit: Payer: Self-pay | Admitting: *Deleted

## 2022-07-13 DIAGNOSIS — R55 Syncope and collapse: Secondary | ICD-10-CM

## 2022-07-16 ENCOUNTER — Encounter: Payer: Self-pay | Admitting: Adult Health

## 2022-07-19 ENCOUNTER — Ambulatory Visit: Payer: 59 | Attending: Cardiology

## 2022-07-19 DIAGNOSIS — R55 Syncope and collapse: Secondary | ICD-10-CM

## 2022-07-19 NOTE — Progress Notes (Unsigned)
Redo ZIO XT serial # [id number] from office inventory applied to patient using tincture of benzoin.

## 2022-07-20 ENCOUNTER — Ambulatory Visit: Payer: 59

## 2022-07-20 DIAGNOSIS — R55 Syncope and collapse: Secondary | ICD-10-CM

## 2022-07-20 NOTE — Telephone Encounter (Signed)
FYI

## 2022-07-20 NOTE — Telephone Encounter (Signed)
Fmla is time sensitive

## 2022-07-23 ENCOUNTER — Encounter (INDEPENDENT_AMBULATORY_CARE_PROVIDER_SITE_OTHER): Payer: Self-pay

## 2022-07-27 ENCOUNTER — Encounter: Payer: Self-pay | Admitting: Adult Health

## 2022-07-28 ENCOUNTER — Other Ambulatory Visit: Payer: Self-pay | Admitting: Adult Health

## 2022-07-28 MED ORDER — GLIPIZIDE ER 5 MG PO TB24: 5.0000 mg | ORAL_TABLET | Freq: Every day | ORAL | 0 refills | Status: DC

## 2022-08-04 ENCOUNTER — Encounter: Payer: Self-pay | Admitting: Adult Health

## 2022-08-04 ENCOUNTER — Ambulatory Visit: Payer: 59 | Admitting: Adult Health

## 2022-08-04 ENCOUNTER — Encounter: Payer: Self-pay | Admitting: Internal Medicine

## 2022-08-04 VITALS — BP 120/82 | HR 104 | Temp 98.4°F | Ht 69.75 in | Wt 177.0 lb

## 2022-08-04 DIAGNOSIS — E1165 Type 2 diabetes mellitus with hyperglycemia: Secondary | ICD-10-CM

## 2022-08-04 LAB — POCT GLYCOSYLATED HEMOGLOBIN (HGB A1C): Hemoglobin A1C: 6.3 % — AB (ref 4.0–5.6)

## 2022-08-04 LAB — GLUCOSE, POCT (MANUAL RESULT ENTRY): POC Glucose: 239 mg/dl — AB (ref 70–99)

## 2022-08-04 NOTE — Progress Notes (Signed)
Subjective:    Patient ID: Danielle [name], female    DOB: 1976-05-11, 47 y.o.   MRN: [id number]  HPI 47 year old female who  has a past medical history of Anxiety, Asthma, Diabetes mellitus without complication ([place]), HPV in female, Hyperlipidemia, Migraine, No blood products, Polycystic ovary syndrome, Seasonal allergies, and Thyroid disease.  She presents to the office today for follow up regarding diabetes mellitus type 2. She was managed with Metformin 500 mg ER daily ( was written for BID dosing but only took it once daily). She then sent a mychart message informing me that her Elenor Legato alarm was going off in the middle of the night informing her that she was hypoglycemic.  Then switched her to glipizide 5 mg extended release in the hopes that it would even out her blood sugars throughout the day.  She continues to exercise on a routine basis and eat healthy.  Starting the glipizide she has had more frequent lows with readings in the 70s and 60s per patient.  Her libre sensor shows no hypoglycemic episodes she is at goal roughly 90% of the time and 10% of the time she is in the 180-250 range.  An average glucose of 133  She reports that when her blood sugars go low she will take multiple glucose tabs to bring help her blood sugars and it will spike and then come crashing down   She is frustrated and tearful at times during this office visit due to not being able to control her blood sugars adequately.  Lab Results  Component Value Date   HGBA1C 6.3 (A) 05/11/2022    Review of Systems See HPI   Past Medical History:  Diagnosis Date   Anxiety    R/T dx   Asthma    dx 2003=1st asthma attack 10/2021   Diabetes mellitus without complication (HCC)    on meds   HPV in female    Hyperlipidemia    diet controlled/exercise   Migraine    No blood products    Polycystic ovary syndrome    Seasonal allergies    Thyroid disease    on meds    Social History   Socioeconomic  History   Marital status: Single    Spouse name: Not on file   Number of children: Not on file   Years of education: Not on file   Highest education level: Not on file  Occupational History   Not on file  Tobacco Use   Smoking status: Former    Types: Cigarettes    Quit date: 01/30/2022    Years since quitting: 0.5   Smokeless tobacco: Never  Vaping Use   Vaping Use: Never used  Substance and Sexual Activity   Alcohol use: Not Currently    Alcohol/week: 4.0 - 6.0 standard drinks of alcohol    Types: 2 - 3 Glasses of wine, 2 - 3 Shots of liquor per week    Comment: Socially   Drug use: No   Sexual activity: Yes    Birth control/protection: Pill  Other Topics Concern   Not on file  Social History Narrative   Not on file   Social Determinants of Health   Financial Resource Strain: Not on file  Food Insecurity: Not on file  Transportation Needs: Not on file  Physical Activity: Not on file  Stress: Not on file  Social Connections: Not on file  Intimate Partner Violence: Not on file    Past Surgical History:  Procedure Laterality Date   EXCISION VAGINAL CYST     removal of genital warts   WISDOM TOOTH EXTRACTION      Family History  Problem Relation Age of Onset   Hyperlipidemia Mother    Hypertension Mother    Diabetes Mother    Cancer Father        lung tumor/ lung removed   Hypertension Sister    Breast cancer Paternal Aunt    Breast cancer Paternal Grandmother    Colon polyps Neg Hx    Colon cancer Neg Hx    Esophageal cancer Neg Hx    Rectal cancer Neg Hx    Stomach cancer Neg Hx     Allergies  Allergen Reactions   Allegra [Fexofenadine Hcl]     Swelling occurs   Metformin And Related Diarrhea   Sulfa Antibiotics    Aspirin Rash    Baby asprin; as a child, rash from dyes     Current Outpatient Medications on File Prior to Visit  Medication Sig Dispense Refill   Acetaminophen (TYLENOL 8 HOUR PO) Take 1 tablet by mouth every 4 (four) hours as  needed.     albuterol (VENTOLIN HFA) 108 (90 Base) MCG/ACT inhaler Inhale 1-2 puffs into the lungs every 6 (six) hours as needed for wheezing or shortness of breath. 1 each 1   buPROPion (WELLBUTRIN XL) 150 MG 24 hr tablet Take 1 tablet (150 mg total) by mouth daily. 90 tablet 1   Continuous Blood Gluc Sensor (FREESTYLE LIBRE 3 SENSOR) MISC PLACE ONE SENSOR ONTO THE SKIN EVERY 14 DAYS 2 each 0   ELINEST 0.3-30 MG-MCG tablet Take 1 tablet by mouth daily.     Ferrous Sulfate (IRON PO) Take 1 tablet by mouth daily at 6 (six) AM. Up to 2 tabs depending on blood loss level     fluticasone (FLONASE) 50 MCG/ACT nasal spray Place 2 sprays into both nostrils daily. 16 g 0   glipiZIDE (GLUCOTROL XL) 5 MG 24 hr tablet Take 1 tablet (5 mg total) by mouth daily with breakfast. 90 tablet 0   levothyroxine (SYNTHROID) 137 MCG tablet Take 1 tablet (137 mcg total) by mouth as directed. Two tabs on Sundays and 1 tab rest of the week 96 tablet 3   ondansetron (ZOFRAN-ODT) 4 MG disintegrating tablet 4mg  ODT q4 hours prn nausea/vomit 20 tablet 0   predniSONE (DELTASONE) 10 MG tablet 40 mg x 3 days, 20 mg x 3 days, 10 mg x 3 days 21 tablet 0   spironolactone (ALDACTONE) 50 MG tablet Take 1 tablet (50 mg total) by mouth 2 (two) times daily. 180 tablet 3   valACYclovir (VALTREX) 500 MG tablet TAKE 1 CAPLET BY MOUTH TWICE DAILY AS NEEDED     metFORMIN (GLUCOPHAGE-XR) 500 MG 24 hr tablet Take 1 tablet (500 mg total) by mouth 2 (two) times daily with a meal. 180 tablet 0   No current facility-administered medications on file prior to visit.    BP 120/82   Pulse (!) 104   Temp 98.4 F (36.9 C) (Oral)   Ht 5' 9.75" (1.772 m)   Wt 177 lb (80.3 kg)   SpO2 98%   BMI 25.58 kg/m       Objective:   Physical Exam Vitals and nursing note reviewed.  Constitutional:      Appearance: Normal appearance.  Skin:    General: Skin is warm and dry.  Neurological:     General: No focal deficit present.  Mental Status:  She is alert and oriented to person, place, and time.  Psychiatric:        Mood and Affect: Mood normal. Affect is tearful.        Behavior: Behavior normal.        Thought Content: Thought content normal.        Judgment: Judgment normal.       Assessment & Plan:  1. Type 2 diabetes mellitus with hyperglycemia, without long-term current use of insulin (HCC) -We had a long discussion involving diabetes mellitus, medications, and diet.  I am proud of her for having a complete lifestyle change.  I think some of her anxiety is getting in the way of management.  Will DC her glipizide and place her back on metformin 500 mg extended release, she was advised to take this at nighttime.  Will have her follow-up in 3 months or sooner if blood sugars are still up-and-down. - POC HgB A1c- 6.3 - has stayed the same  - POC Glucose (CBG)- 239 ( took multiple glucose tabs prior to office visit today)   Time spent with patient today was 45 minutes which consisted of chart review, discussing diabetes, diet, exercise, anxiety work up, treatment answering questions and documentation.

## 2022-08-06 ENCOUNTER — Encounter: Payer: Self-pay | Admitting: Internal Medicine

## 2022-08-07 ENCOUNTER — Other Ambulatory Visit: Payer: Self-pay | Admitting: Adult Health

## 2022-08-07 DIAGNOSIS — E1169 Type 2 diabetes mellitus with other specified complication: Secondary | ICD-10-CM

## 2022-08-09 ENCOUNTER — Ambulatory Visit (HOSPITAL_COMMUNITY): Payer: 59 | Attending: Cardiology

## 2022-08-09 DIAGNOSIS — R55 Syncope and collapse: Secondary | ICD-10-CM | POA: Insufficient documentation

## 2022-08-09 LAB — ECHOCARDIOGRAM COMPLETE
Area-P 1/2: 4.06 cm2
S' Lateral: 3.2 cm

## 2022-08-11 ENCOUNTER — Ambulatory Visit: Payer: 59 | Admitting: Adult Health

## 2022-08-13 ENCOUNTER — Encounter: Payer: Self-pay | Admitting: Adult Health

## 2022-08-27 ENCOUNTER — Encounter: Payer: Self-pay | Admitting: Internal Medicine

## 2022-09-03 ENCOUNTER — Encounter: Payer: Self-pay | Admitting: Adult Health

## 2022-09-03 ENCOUNTER — Encounter: Payer: Self-pay | Admitting: Internal Medicine

## 2022-09-03 NOTE — Telephone Encounter (Signed)
Please advise 

## 2022-09-06 ENCOUNTER — Other Ambulatory Visit: Payer: Self-pay | Admitting: Adult Health

## 2022-09-06 ENCOUNTER — Encounter: Payer: Self-pay | Admitting: Adult Health

## 2022-09-07 ENCOUNTER — Encounter: Payer: Self-pay | Admitting: Cardiology

## 2022-09-07 DIAGNOSIS — R55 Syncope and collapse: Secondary | ICD-10-CM

## 2022-09-07 MED ORDER — LANCET DEVICE MISC: 1.0000 | Freq: Three times a day (TID) | 0 refills | Status: AC

## 2022-09-07 MED ORDER — BLOOD GLUCOSE TEST VI STRP: 1.0000 | ORAL_STRIP | Freq: Three times a day (TID) | 0 refills | Status: DC

## 2022-09-07 MED ORDER — BLOOD GLUCOSE MONITORING SUPPL DEVI: 1.0000 | Freq: Three times a day (TID) | 0 refills | Status: AC

## 2022-09-07 MED ORDER — LANCETS MISC. MISC: 1.0000 | Freq: Three times a day (TID) | 0 refills | Status: AC

## 2022-09-08 ENCOUNTER — Encounter: Payer: Self-pay | Admitting: Neurology
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
## 2022-09-18 ENCOUNTER — Encounter: Payer: Self-pay | Admitting: Gastroenterology

## 2022-09-22 ENCOUNTER — Encounter: Payer: Self-pay | Admitting: Adult Health

## 2022-09-22 ENCOUNTER — Ambulatory Visit (INDEPENDENT_AMBULATORY_CARE_PROVIDER_SITE_OTHER): Payer: 59 | Admitting: Neurology

## 2022-09-22 ENCOUNTER — Encounter: Payer: Self-pay | Admitting: Neurology

## 2022-09-22 ENCOUNTER — Other Ambulatory Visit (INDEPENDENT_AMBULATORY_CARE_PROVIDER_SITE_OTHER): Payer: 59

## 2022-09-22 VITALS — BP 114/80 | HR 90 | Ht 69.25 in | Wt 182.0 lb

## 2022-09-22 DIAGNOSIS — R55 Syncope and collapse: Secondary | ICD-10-CM

## 2022-09-22 DIAGNOSIS — F419 Anxiety disorder, unspecified: Secondary | ICD-10-CM

## 2022-09-22 DIAGNOSIS — R42 Dizziness and giddiness: Secondary | ICD-10-CM | POA: Diagnosis not present

## 2022-09-22 DIAGNOSIS — R29818 Other symptoms and signs involving the nervous system: Secondary | ICD-10-CM | POA: Diagnosis not present

## 2022-09-22 DIAGNOSIS — E1165 Type 2 diabetes mellitus with hyperglycemia: Secondary | ICD-10-CM

## 2022-09-22 DIAGNOSIS — E890 Postprocedural hypothyroidism: Secondary | ICD-10-CM

## 2022-09-22 NOTE — Patient Instructions (Addendum)
I am not sure the cause of your symptoms but I would like to investigate further with the following: -Blood work today -MRI of your brain -Brain wave testing called EEG  I will be in touch when I have your results and we will discuss next steps.  I want to see you back in clinic in 3 months.  The physicians and staff at [facility] are committed to providing excellent care. You may receive a survey requesting feedback about your experience at our office. We strive to receive "very good" responses to the survey questions. If you feel that your experience would prevent you from giving the office a "very good " response, please contact our office to try to remedy the situation. We may be reached at [phone]. Thank you for taking the time out of your busy day to complete the survey.  Kai Levins, MD [facility]   Guidelines for the Nonpharmacological Treatment of Orthostatic Hypotension/ Orthostatic Tachycardia/ Orthostatic Intolerance  Make all postural changes from lying to sitting or sitting to standing, slowly.  Drink 2.0-2.5 L of fluid per day (if okay with your other doctors).  Increase sodium in the diet to 3-5 g per day (if okay with your other doctors).  Avoid large meals which can cause low blood pressure during digestion. It is better to eat smaller meals more often than 3 large meals.  Avoid alcohol. Alcohol can cause blood to pool in the legs which may worsen low blood pressure reactions when standing.  Perform lower extremity exercises to improve strength of the leg muscles. This will help prevent blood from pooling in the legs when standing and walking. Raise the head of the bed by 6-10 inches. The entire bed must be at an angle. Raising only the head portion of the bed at the waist level or using pillows will not be effective. Raising the head of the bed will reduce urine formation overnight and there will be more volume in the circulation in the morning. It  may also help orthostatic tolerance during the day.  During bad days or prior to engaging in more physical activity than usual, drink 500 ml of water quickly. This will result in increased blood pressure within 5 minutes of drinking the water. The effect will last up to a few hours and may improve orthostatic intolerance.  Use custom-fitted elastic support stockings. This will reduce a tendency for blood to pool in the legs when standing and may improve orthostatic intolerance. Abdominal binder or "SPANX" may also be useful.  Use physical counter-maneuvers such as leg crossing, squatting, or raising and resting the leg on a chair. These maneuvers increase blood pressure and can improve orthostatic intolerance.  Gently escalated aerobic physical activity program for graded reconditioning (see details below)  Home-based Exercise Plan for Patients with Orthostatic Intolerance (as may be seen in POTS and related disorders)  Level 1 - Reclined Gentle Movements This is the starting point for those patients most severely disabled by an autonomic disorder. If you are bedridden, this is your first step. We have known dysautonomia patients who were bedridden for years, and we able to work their way from "barely able to move" to biking 45 minutes a day, everyday. It will be hard. It may make you feel worse in the beginning. But the human body was meant to move. Just take baby steps until you can make it to your goal. Some patients can only do one minute a day, or one minute at a  time, a few times a day. And they do this every day for a week, and then they increase to two minutes a day the next week. This is a very slow process, but improving your health slowly is better than not improving it at all. Helpful exercises may include:  Leg Pillow Squeeze - while laying down or reclined in bed, put a pillow folded between your knees and squeeze. Hold it for 10 seconds. Repeat.  Arm Pillow Squeeze - put the pillow  folded between your palms and squeeze together as though you were putting your hands into a praying position. Hold it for 10 seconds. Repeat.  Alphabet Toes - while laying in bed, write your name in the air with your toes. If you can build up your strength, write the whole alphabet. Do this several times a day.  Side Leg Lifts - while laying on your side, lift your leg up sideways and then bring your leg back down, without touching your legs together. Repeat.  Front Leg Lifts - While laying on your back, life your left leg up, pointing your toe towards the ceiling. Repeat. Switch to right leg.  Gentle Stretching - any kind of stretching helps move blood around in the body and takes stress of your joints if you have been sitting or laying in the same position for a long time. Go through the entire body doing mild stretches, from feet, to legs, to back, to arms, to neck. Doing this when you wake up can be a great way to start the day, and repeating your stretches before bed can help you relax and sleep better.  Level 2 - Recumbent Cardio Exercises This is probably the level that most patients will be able to begin with, although everyone can benefit from the gentle stretching and toning exercises described in Level 1.  Always begin your workout with 5-10 minutes of stretching and/or yoga to warm up your muscles and protect your joints from injury.  Since the point of these exercises is to get your cardiovascular system to be more efficient, you will want to set a target heart rate for your workout. You should speak to your doctor about this because medications and other medical conditions can impact your target heart rate, but most patients can tolerate a workout at 75% to 80% of their maximum heart rate. [facility] has a Target Heart Rate Calculator you can use as a guide when speaking with your doctor.  You may want to purchase an exercise heart rate monitor to wear during your workouts to help  you keep your heart rate within your target zone. We do not endorse any specific products, but exercise heart rate monitors with chest straps are usually more accurate than pulse oximeters you place on your finger. This is especially so for dysautonomia patients who have abnormalities in peripheral blood flow (common in some forms of dysautonomia), as this is more likely to give an inaccurate reading using a finger based monitor.  Suggested reclined cardiovascular exercises include:  Rowing - use a rowing machine, or if you are feeling well enough, a kayak. You may want to start out slow, maybe 2-5 minutes a day. At your own pace, adding a few minutes per week, try to work your way up to 45 minutes per day, 5 days a week, with 30 minutes of your routine done in your target heart rate zone. Be sure to warm up at the beginning and cool down at the end.  Recumbent Biking - recumbent exercise bikes are different than regular exercise bikes. They seat the rider in a reclined position, rather than upright. Try recumbent biking a few minutes a day, adding a few minutes each week, until you can work out 45 minutes a day, five days a week, with 30 minutes of that workout in your target heart rate zone. Be sure to warm up at the beginning and cool down at the end of each workout.  Swimming - The pressure from water helps prevent orthostatic symptoms. Dysautonomia patients who have been bedridden for years may be able to stand upright for an hour in a pool, because the pressure from the water prevents orthostatic symptoms from occurring, or lessens their impact. Dysautonomia patients can take advantage of this to get a good cardio workout, or to focus on stretching and strength training in the water. Always swim with a spotter or a buddy who can keep and eye on you, just in case you develop lightheadedness or other symptoms that would make it unsafe to be in a pool. It may be best to start your swimming exercise  program at a pool with a lifeguard, or with a Physical Therapist who specializes in aquatic therapy. A good old fashioned kick board can be a great tool for dysautonomia patients. You can kick your way around the pool, which gives you a good cardio workout, and all that kicking helps strengthen your legs. Toning up your legs and core is a great way to minimize orthostatic symptoms.  Weight Training - Most dysautonomia patients can benefit from overall increase in tone and strength. The stronger our muscles are, the more efficiently they use oxygen, the better we will be able to tolerate orthostatic stress. Special emphasis can be placed on strengthening leg and core muscles. Some patients find it useful to wear 2 to 4 lb. weights that attach to the ankles with velcro. This can really help with leg strength if you wear them often enough. There are also machines at the gym that assist with core and leg weight training. Begin with light weights, and use them in a reclined or seated position. Some patients become extra symptomatic when lifting their arms over their head, so take extra care if attempting to do that.  Level 3 - Normal Workouts Some dysautonomia patients are able to jog, run marathons or walk several miles a week. These patients should do whatever they can to continue these activities. Dysautonomia patients who are well-conditioned should exercise 45 minutes a day, at least 3 days per week. Special emphasis should be placed on leg and core strength, and cardiovascular exercises.

## 2022-09-23 ENCOUNTER — Other Ambulatory Visit: Payer: Self-pay | Admitting: Internal Medicine

## 2022-09-23 ENCOUNTER — Encounter: Payer: Self-pay | Admitting: Internal Medicine

## 2022-09-23 ENCOUNTER — Other Ambulatory Visit: Payer: Self-pay | Admitting: Adult Health

## 2022-09-23 LAB — TSH: TSH: 0.05 u[IU]/mL — ABNORMAL LOW (ref 0.35–5.50)

## 2022-09-23 LAB — T4, FREE: Free T4: 1.4 ng/dL (ref 0.60–1.60)

## 2022-09-23 MED ORDER — LEVOTHYROXINE SODIUM 125 MCG PO TABS: 125.0000 ug | ORAL_TABLET | ORAL | 3 refills | Status: DC

## 2022-09-23 MED ORDER — BUPROPION HCL ER (XL) 150 MG PO TB24: 150.0000 mg | ORAL_TABLET | Freq: Every day | ORAL | 1 refills | Status: DC

## 2022-09-26 LAB — VITAMIN B1: Vitamin B1 (Thiamine): 15 nmol/L (ref 8–30)

## 2022-09-29 LAB — IMMUNOFIXATION ELECTROPHORESIS
IgG (Immunoglobin G), Serum: 1238 mg/dL (ref 600–1640)
IgM, Serum: 86 mg/dL (ref 50–300)
Immunoglobulin A: 205 mg/dL (ref 47–310)

## 2022-09-29 LAB — VITAMIN B12: Vitamin B-12: 973 pg/mL (ref 200–1100)

## 2022-09-29 LAB — TSH: TSH: 0.04 mIU/L — ABNORMAL LOW

## 2022-09-30 ENCOUNTER — Encounter: Payer: Self-pay | Admitting: Neurology

## 2022-09-30 ENCOUNTER — Ambulatory Visit: Payer: 59 | Admitting: Neurology

## 2022-09-30 ENCOUNTER — Encounter: Payer: Self-pay | Admitting: Internal Medicine

## 2022-09-30 DIAGNOSIS — R42 Dizziness and giddiness: Secondary | ICD-10-CM

## 2022-09-30 DIAGNOSIS — F419 Anxiety disorder, unspecified: Secondary | ICD-10-CM | POA: Diagnosis not present

## 2022-09-30 DIAGNOSIS — R55 Syncope and collapse: Secondary | ICD-10-CM

## 2022-09-30 DIAGNOSIS — E1165 Type 2 diabetes mellitus with hyperglycemia: Secondary | ICD-10-CM

## 2022-09-30 DIAGNOSIS — R29818 Other symptoms and signs involving the nervous system: Secondary | ICD-10-CM

## 2022-09-30 NOTE — Progress Notes (Signed)
EEG complete - results pending 

## 2022-10-02 ENCOUNTER — Other Ambulatory Visit: Payer: Self-pay | Admitting: Adult Health

## 2022-10-06 ENCOUNTER — Encounter: Payer: Self-pay | Admitting: Internal Medicine

## 2022-10-06 ENCOUNTER — Ambulatory Visit: Payer: 59 | Admitting: Internal Medicine

## 2022-10-06 VITALS — BP 120/76 | HR 94 | Ht 69.25 in | Wt 179.0 lb

## 2022-10-06 DIAGNOSIS — E890 Postprocedural hypothyroidism: Secondary | ICD-10-CM

## 2022-10-06 DIAGNOSIS — Z8639 Personal history of other endocrine, nutritional and metabolic disease: Secondary | ICD-10-CM

## 2022-10-06 DIAGNOSIS — E7849 Other hyperlipidemia: Secondary | ICD-10-CM

## 2022-10-06 DIAGNOSIS — E282 Polycystic ovarian syndrome: Secondary | ICD-10-CM

## 2022-10-06 NOTE — Patient Instructions (Addendum)
Please come back for labs in ~3 weeks.  Please continue: - Levothyroxine 125 mcg daily   Take the thyroid hormone every day, with water, at least 30 minutes before breakfast, separated by at least 4 hours from: - acid reflux medications - calcium - iron - multivitamins  Look up the "Portfolio diet".  Please come back for a follow-up appointment in 6 months.

## 2022-10-06 NOTE — Progress Notes (Signed)
Patient ID: Danielle [name], female   DOB: [birth year], 47 y.o.   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  She also has diabetes type 2, which is managed by PCP.  Last visit 6 months ago.  Interim history: She was diagnosed with diabetes in 01/2022.  She started metformin, then glipizide, then back on Metformin, but now off all diabetic medications with good control. She continues to exercise consistently 5-6 days a week. Also, drinks less alcohol and is adjusting her diet. No increased urination, nausea, chest pain. She had blurry vision - saw ophthalmologist >> needs bifocals. She had a EEG last week, MRI pending.  Hypothyroidism: She has a history of medication noncompliance with levothyroxine. In 04/2018, she was missing levothyroxine doses and her TSH was high (at that time she had increased stress as she separated from her husband).  She improved compliance afterwards but continued to miss doses.  Since last visit, she mentions that she did not miss any more doses.  Pt is on levothyroxine 125 mcg daily (dose decrease 09/22/2022), taken: - in am - fasting - at least 30 min from b'fast - no Ca, PPIs, + occasional multivitamins >4h later - off iron now - stopped Biotin (H[employer] Skin and N[employer]), now B complex with 2000 mcg of biotin  Reviewed her TFTs: Lab Results  Component Value Date   TSH 0.04 (L) 09/22/2022   TSH 0.05 (L) 09/22/2022   TSH 0.15 (L) 04/02/2022   TSH 9.16 (H) 02/05/2022   TSH 2.18 01/05/2022   TSH 12.40 (H) 09/28/2021   TSH 6.06 (H) 04/10/2020   TSH 6.20 (H) 02/05/2020   TSH 2.89 11/12/2019   TSH 2.26 05/17/2019   FREET4 1.40 09/22/2022   FREET4 1.30 04/02/2022   FREET4 1.13 01/05/2022   FREET4 0.73 09/28/2021   FREET4 0.88 04/10/2020   FREET4 1.18 11/12/2019   FREET4 1.04 05/17/2019   FREET4 0.68 05/16/2018   FREET4  1.07 05/13/2017   FREET4 0.87 11/11/2016  05/06/2014: (Dr. Dareen Piano, ObGyn): TSH 12.58  At last check, TSI's were undetectable: Lab Results  Component Value Date   TSI <89 05/16/2018    Pt denies: - feeling nodules in neck - hoarseness - dysphagia - choking  She has + FH of thyroid disorders in: mother. No FH of thyroid cancer. No h/o radiation tx to head or neck. No herbal supplements. No recent Biotin use. No recent steroids use.   PCOS: -She had heavy bleeding, after which we started OCPs in 2017, but continued to have increased bleeding so she was switched to NuvaRing, however, bleeding continued so she stopped it.  She was then in a research trial with a form of oral contraceptive.  Her bleeding improved.  The study finished in 08/2019.  -In 2021, she wanted to switch back to oral contraceptives.  Due to previous history of increased vaginal bleeding on these, I directed her back to OB/GYN >> she restarted OCPs before our last visit -norethindrone.  However, since last visit, she came off this. ObGyn: Dr. Leone Payor.  -In 08/2020, she wanted to start back spironolactone.  She finally started this in 2023, but stopped it due to various symptoms including dehydration and near syncopal episodes.  Prediabetes:  Reviewed HbA1c levels: Lab Results  Component Value Date   HGBA1C 6.3 (A) 08/04/2022   HGBA1C 6.3 (A) 05/11/2022  HGBA1C 7.0 (H) 02/05/2022   HGBA1C 6.9 (H) 09/28/2021   HGBA1C 5.9 (A) 09/08/2020   HGBA1C 6.2 04/10/2020   HGBA1C 6.2 (A) 11/12/2019   HGBA1C 6.4 05/17/2019   HGBA1C 6.2 05/16/2018   HGBA1C 5.9 08/15/2012   Prior insurance did not cover nutrition visits when she had prediabetes.  No CKD: Lab Results  Component Value Date   BUN 12 04/19/2022   Lab Results  Component Value Date   CREATININE 0.82 04/19/2022   + HL: Lab Results  Component Value Date   CHOL 215 (H) 05/18/2022   HDL 44.50 05/18/2022   LDLCALC 154 (H) 05/18/2022   TRIG 82.0  05/18/2022   CHOLHDL 5 05/18/2022  She is not on a statin.  ROS:  + see HPI  I reviewed pt's medications, allergies, PMH, social hx, family hx, and changes were documented in the history of present illness. Otherwise, unchanged from my initial visit note.  Past Medical History:  Diagnosis Date   Anxiety    R/T dx   Asthma    dx 2003=1st asthma attack 10/2021   Diabetes mellitus without complication (HCC)    on meds   HPV in female    Hyperlipidemia    diet controlled/exercise   Migraine    No blood products    Polycystic ovary syndrome    Seasonal allergies    Thyroid disease    on meds   Past Surgical History:  Procedure Laterality Date   dnc     EXCISION VAGINAL CYST     removal of genital warts   WISDOM TOOTH EXTRACTION     History   Social History   Marital Status: Married    Spouse Name: N/A    Number of Children: 0   Occupational History      Social History Main Topics   Smoking status: Former Smoker   Smokeless tobacco: Not on file   Alcohol Use: Yes   Drug Use: No   Sexual Activity: Yes    Birth Control/ Protection: Pill   Current Outpatient Medications on File Prior to Visit  Medication Sig Dispense Refill   buPROPion (WELLBUTRIN XL) 150 MG 24 hr tablet Take 1 tablet (150 mg total) by mouth daily. 90 tablet 1   albuterol (VENTOLIN HFA) 108 (90 Base) MCG/ACT inhaler Inhale 1-2 puffs into the lungs every 6 (six) hours as needed for wheezing or shortness of breath. 1 each 1   Blood Glucose Monitoring Suppl DEVI 1 each by Does not apply route in the morning, at noon, and at bedtime. May substitute to any manufacturer covered by patient's insurance. Use to check blood sugar TID 1 each 0   Continuous Blood Gluc Sensor (FREESTYLE LIBRE 3 SENSOR) MISC PLACE ONE SENSOR ONTO THE SKIN EVERY 14 DAYS 2 each 0   Ferrous Sulfate (IRON PO) Take 1 tablet by mouth daily at 6 (six) AM. Up to 2 tabs depending on blood loss level     fluticasone (FLONASE) 50 MCG/ACT  nasal spray Place 2 sprays into both nostrils daily. 16 g 0   Glucose Blood (BLOOD GLUCOSE TEST STRIPS) STRP 1 each by Other route in the morning, at noon, and at bedtime. May substitute to any manufacturer covered by patient's insurance. Use to check blood sugar TID 270 each 0   Lancet Device MISC 1 each by Does not apply route in the morning, at noon, and at bedtime. May substitute to any manufacturer covered by patient's insurance.Use to check blood sugar  TID 1 each 0   Lancets Misc. MISC 1 each by Does not apply route in the morning, at noon, and at bedtime. May substitute to any manufacturer covered by patient's insurance. 100 each 0   levothyroxine (SYNTHROID) 125 MCG tablet Take 1 tablet (125 mcg total) by mouth as directed. 45 tablet 3   metFORMIN (GLUCOPHAGE-XR) 500 MG 24 hr tablet Take 1 tablet (500 mg total) by mouth 2 (two) times daily with a meal. (Patient not taking: Reported on 09/22/2022) 180 tablet 0   ondansetron (ZOFRAN-ODT) 4 MG disintegrating tablet 4mg  ODT q4 hours prn nausea/vomit 20 tablet 0   valACYclovir (VALTREX) 500 MG tablet TAKE 1 CAPLET BY MOUTH TWICE DAILY AS NEEDED     No current facility-administered medications on file prior to visit.   Allergies  Allergen Reactions   Allegra [Fexofenadine Hcl]     Swelling occurs   Metformin And Related Diarrhea   Sulfa Antibiotics    Aspirin Rash    Baby asprin; as a child, rash from dyes    Family History  Problem Relation Age of Onset   Hyperlipidemia Mother    Hypertension Mother    Diabetes Mother    Cancer Father        lung tumor/ lung removed   Hypertension Sister    Breast cancer Paternal Aunt    Breast cancer Paternal Grandmother    Colon polyps Neg Hx    Colon cancer Neg Hx    Esophageal cancer Neg Hx    Rectal cancer Neg Hx    Stomach cancer Neg Hx    PE: BP 120/76 (BP Location: Right Arm, Patient Position: Sitting, Cuff Size: Normal)   Pulse 94   Ht 5' 9.25" (1.759 m)   Wt 179 lb (81.2 kg)    SpO2 99%   BMI 26.24 kg/m  Wt Readings from Last 3 Encounters:  10/06/22 179 lb (81.2 kg)  09/22/22 182 lb (82.6 kg)  08/04/22 177 lb (80.3 kg)   Constitutional: overweight, in NAD Eyes:  EOMI, no exophthalmos ENT: no neck masses, no cervical lymphadenopathy Cardiovascular: no tachycardia at the time of the exam, RRR, No MRG Respiratory: CTA B Musculoskeletal: no deformities Skin:no rashes Neurological: no tremor with outstretched hands  ASSESSMENT: 1. Postablative Hypothyroidism - after RAI tx for Graves ds.  2.  History of Graves' disease  3. PCOS  4.  DM2 -Diagnosed in 2023 -Latest HbA1c was 6.3% 2 months ago, decreased from 7% -She was initially on metformin (metformin ER due to nausea), then switched to glipizide due to low blood sugars but this only exacerbated her glipizide, as expected, so she went back to metformin, but she is now off any diabetic medications -Good control recently -She exercises consistently and works on her diet and lost weight -Management per PCP  5. HL  PLAN:  1. Patient with longstanding hypothyroidism, history of incomplete compliance with levothyroxine - latest thyroid labs reviewed with pt. >> after increasing the dose of LT4 at last visit, TSH normalized, however, at last check, this was again elevated -retrospectively, this was due to taking all her medicines together in the morning along with levothyroxine.  Since then, she separated them. - latest thyroid labs reviewed with pt. >> TSH was still suppressed Lab Results  Component Value Date   TSH 0.04 (L) 09/22/2022   TSH 0.05 (L) 09/22/2022  - she continues on LT4 125 mcg daily, decreased 09/22/2022 - pt feels good on this dose. - we discussed about  taking the thyroid hormone every day, with water, >30 minutes before breakfast, separated by >4 hours from acid reflux medications, calcium, iron, multivitamins. Pt. is taking it correctly now, no more missing doses. - will check thyroid  tests in 3 more weeks: TSH and fT4 - I we will see her back in 6 months  2.  History of Graves' disease -Resolved after RAI treatment -TSI's were undetectable at last check -She denies neck compression symptoms -No clear signs of Graves' ophthalmopathy: Exophthalmos, double vision, eye pain, chemosis -She previously described  periodic vision changes but these were unlikely to have been related to Graves' disease.  They appeared to be part of aura before syncopal events.  With the last event she lost consciousness completely and lost control of her bladder.  We discussed that she may have had a seizure.  I recommended to see neurology. She is currently under investigation by neurology.  3. PCOS -She has a history of heavy menstrual cycles and was on oral contraceptives, then NuvaRing per OB/GYN.  Afterwards, she wanted to switch back to OCPs but due to previous history of increased bleeding, I recommended that she contacted OB/GYN for this. -She was previously on norethindrone but came off and was interested in starting back on spironolactone.  She did start this, but we held it afterwards due to dehydration and near syncopal episodes. -She is approaching menopause.  No intervention is absolutely necessary now. -She is planning to start saw palmetto.  She is using spearmint tea.  4. DM2 - per PCP  5. HL -Reviewed together her latest lipid panel -Discussed about the portfolio diet -reviewed different foods that can lower her cholesterol levels further. -She would not want to start a statin -Continue exercise and cutting down alcohol  Carlus Pavlovristina Nomar Broad, MD PhD [facility]

## 2022-10-07 NOTE — Procedures (Signed)
ELECTROENCEPHALOGRAM REPORT  Date of Study: 09/30/2022  Patient's Name: Saidee Cassens MRN: [id number] Date of Birth: 01-13-1976  Referring Provider: Dr. Jacquelyne Balint  Clinical History: This is a 47 year old woman with episodes of feeling hot, vision going out, and syncope . EEG for classification.  Medications: Bupriopion, Metformin, Synthroid  Technical Summary: A multichannel digital EEG recording measured by the international 10-20 system with electrodes applied with paste and impedances below 5000 ohms performed in our laboratory with EKG monitoring in an awake and asleep patient.  Hyperventilation and photic stimulation were performed.  The digital EEG was referentially recorded, reformatted, and digitally filtered in a variety of bipolar and referential montages for optimal display.    Description: The patient is awake and asleep during the recording.  During maximal wakefulness, there is a symmetric, medium voltage 10 Hz posterior dominant rhythm that attenuates with eye opening.  The record is symmetric.  During drowsiness and sleep, there is an increase in theta slowing of the background.  Vertex waves and symmetric sleep spindles were seen. Hyperventilation and photic stimulation did not elicit any abnormalities.  There were no epileptiform discharges or electrographic seizures seen.    EKG lead was unremarkable.  Impression: This awake and asleep EEG is normal.    Clinical Correlation: A normal EEG does not exclude a clinical diagnosis of epilepsy.  If further clinical questions remain, prolonged EEG may be helpful.  Clinical correlation is advised.   Patrcia Dolly, M.D.

## 2022-10-15 ENCOUNTER — Encounter: Payer: Self-pay | Admitting: Neurology

## 2022-10-17 ENCOUNTER — Ambulatory Visit
Admission: RE | Admit: 2022-10-17 | Discharge: 2022-10-17 | Disposition: A | Discharge status: Home / Self Care | Payer: 59 | Source: Ambulatory Visit | Attending: Neurology | Admitting: Neurology

## 2022-10-17 ENCOUNTER — Other Ambulatory Visit: Payer: 59

## 2022-10-17 ENCOUNTER — Encounter: Payer: Self-pay | Admitting: Neurology

## 2022-10-17 DIAGNOSIS — R29818 Other symptoms and signs involving the nervous system: Secondary | ICD-10-CM

## 2022-10-17 DIAGNOSIS — E1165 Type 2 diabetes mellitus with hyperglycemia: Secondary | ICD-10-CM

## 2022-10-17 DIAGNOSIS — F419 Anxiety disorder, unspecified: Secondary | ICD-10-CM

## 2022-10-17 DIAGNOSIS — R55 Syncope and collapse: Secondary | ICD-10-CM

## 2022-10-17 DIAGNOSIS — R42 Dizziness and giddiness: Secondary | ICD-10-CM

## 2022-10-18 ENCOUNTER — Other Ambulatory Visit: Payer: Self-pay | Admitting: Neurology

## 2022-10-18 DIAGNOSIS — F419 Anxiety disorder, unspecified: Secondary | ICD-10-CM

## 2022-10-19 ENCOUNTER — Other Ambulatory Visit: Payer: Self-pay | Admitting: Neurology

## 2022-10-19 DIAGNOSIS — F419 Anxiety disorder, unspecified: Secondary | ICD-10-CM

## 2022-10-19 MED ORDER — LORAZEPAM 1 MG PO TABS
1.0000 mg | ORAL_TABLET | ORAL | 0 refills | Status: DC | PRN
Start: 2022-10-19 — End: 2023-10-10

## 2022-10-24 ENCOUNTER — Other Ambulatory Visit: Payer: Self-pay | Admitting: Adult Health

## 2022-10-26 ENCOUNTER — Encounter: Payer: Self-pay | Admitting: Neurology

## 2022-10-27 ENCOUNTER — Telehealth: Payer: Self-pay | Admitting: Neurology

## 2022-10-27 ENCOUNTER — Other Ambulatory Visit: Payer: Self-pay

## 2022-10-27 DIAGNOSIS — R55 Syncope and collapse: Secondary | ICD-10-CM

## 2022-10-27 DIAGNOSIS — R42 Dizziness and giddiness: Secondary | ICD-10-CM

## 2022-10-27 DIAGNOSIS — R29818 Other symptoms and signs involving the nervous system: Secondary | ICD-10-CM

## 2022-10-27 DIAGNOSIS — F419 Anxiety disorder, unspecified: Secondary | ICD-10-CM

## 2022-10-27 NOTE — Telephone Encounter (Signed)
The original prescription was discontinued on 08/04/2022 by Shirline Frees, NP. Renewing this prescription may not be appropriate.

## 2022-10-27 NOTE — Telephone Encounter (Signed)
New message   Patient is asking for MRI order to be re-route to another facility - due to bad customer services.     [facility] [place]. Phone # [phone]

## 2022-10-27 NOTE — Telephone Encounter (Signed)
Called pt and will change MRI to [facility] told her it may be the end of next week but I will do my best

## 2022-11-01 ENCOUNTER — Other Ambulatory Visit (INDEPENDENT_AMBULATORY_CARE_PROVIDER_SITE_OTHER): Payer: 59

## 2022-11-01 DIAGNOSIS — E890 Postprocedural hypothyroidism: Secondary | ICD-10-CM | POA: Diagnosis not present

## 2022-11-01 LAB — T4, FREE: Free T4: 1.35 ng/dL (ref 0.60–1.60)

## 2022-11-01 LAB — TSH: TSH: 0.2 u[IU]/mL — ABNORMAL LOW (ref 0.35–5.50)

## 2022-11-02 MED ORDER — LEVOTHYROXINE SODIUM 112 MCG PO TABS
112.0000 ug | ORAL_TABLET | Freq: Every day | ORAL | 3 refills | Status: DC
Start: 2022-11-02 — End: 2023-04-05

## 2022-11-10 ENCOUNTER — Telehealth: Payer: Self-pay | Admitting: Neurology

## 2022-11-10 NOTE — Telephone Encounter (Signed)
Patient has an MRI scheduled tomorrow and wants to make sure her RX lorazepam is sent in for her anxiety. Would like a call asap

## 2022-11-10 NOTE — Telephone Encounter (Signed)
Pt called stating she would like to get a Rx for lorazepam sent in asap. Her MRI appt is at 2 pm and needs to be there at 1:30 pm. Rx needs to go to [place] on [address].

## 2022-11-10 NOTE — Telephone Encounter (Signed)
Called patient and informed patient that her medication was sent to pharmacy and she may go pick it up. I called [facility] and confirmed her rx was still there. Patient will go and pick it up.

## 2022-11-10 NOTE — Telephone Encounter (Signed)
Lorazapam was sent to [facility]

## 2022-11-16 ENCOUNTER — Encounter: Payer: Self-pay | Admitting: Neurology

## 2022-11-29 ENCOUNTER — Encounter: Payer: Self-pay | Admitting: Cardiology

## 2022-11-29 ENCOUNTER — Ambulatory Visit: Payer: 59 | Attending: Cardiology | Admitting: Cardiology

## 2022-11-29 VITALS — BP 98/64 | HR 93 | Ht 69.25 in | Wt 182.8 lb

## 2022-11-29 DIAGNOSIS — R42 Dizziness and giddiness: Secondary | ICD-10-CM

## 2022-11-29 NOTE — Patient Instructions (Signed)
Medication Instructions:  The current medical regimen is effective;  continue present plan and medications.  *If you need a refill on your cardiac medications before your next appointment, please call your pharmacy*   You have been referred to Ear, Nose and Throat MD for further evaluation and treatment.  Follow-Up: At [facility], you and your health needs are our priority.  As part of our continuing mission to provide you with exceptional heart care, we have created designated Provider Care Teams.  These Care Teams include your primary Cardiologist (physician) and Advanced Practice Providers (APPs -  Physician Assistants and Nurse Practitioners) who all work together to provide you with the care you need, when you need it.  We recommend signing up for the patient portal called "MyChart".  Sign up information is provided on this After Visit Summary.  MyChart is used to connect with patients for Virtual Visits (Telemedicine).  Patients are able to view lab/test results, encounter notes, upcoming appointments, etc.  Non-urgent messages can be sent to your provider as well.   To learn more about what you can do with MyChart, go to [email or web address].    Your next appointment:   Follow up with Dr Anne Fu as needed.

## 2022-11-29 NOTE — Progress Notes (Signed)
Cardiology Office Note:    Date:  11/29/2022   ID:  Danielle [name], DOB [birth year], MRN [id number]  PCP:  Shirline Frees, NP   [facility] Providers Cardiologist:  Donato Schultz, MD     Referring MD: Shirline Frees, NP    History of Present Illness:    Danielle [name] is a 47 y.o. female here for follow-up syncope.  Had reassuring monitor on 08/09/2022.  Likely vasovagal  Past Medical History:  Diagnosis Date   Anxiety    R/T dx   Asthma    dx 2003=1st asthma attack 10/2021   Diabetes mellitus without complication (HCC)    on meds   HPV in female    Hyperlipidemia    diet controlled/exercise   Migraine    No blood products    Polycystic ovary syndrome    Seasonal allergies    Thyroid disease    on meds    Past Surgical History:  Procedure Laterality Date   dnc     EXCISION VAGINAL CYST     removal of genital warts   WISDOM TOOTH EXTRACTION      Current Medications: Current Meds  Medication Sig   ACCU-CHEK GUIDE test strip USE TO TEST BLOOD SUGAR EVERY  MORNING AT NOON AND EVERY NIGHT  AT BEDTIME   albuterol (VENTOLIN HFA) 108 (90 Base) MCG/ACT inhaler Inhale 1-2 puffs into the lungs every 6 (six) hours as needed for wheezing or shortness of breath.   Blood Glucose Monitoring Suppl DEVI 1 each by Does not apply route in the morning, at noon, and at bedtime. May substitute to any manufacturer covered by patient's insurance. Use to check blood sugar TID   buPROPion (WELLBUTRIN XL) 150 MG 24 hr tablet Take 1 tablet (150 mg total) by mouth daily.   Ferrous Sulfate (IRON PO) Take 1 tablet by mouth daily at 6 (six) AM. Up to 2 tabs depending on blood loss level   fluticasone (FLONASE) 50 MCG/ACT nasal spray Place 2 sprays into both nostrils daily.   levothyroxine (SYNTHROID) 112 MCG tablet Take 1 tablet (112 mcg total) by mouth daily before breakfast.   LORazepam (ATIVAN) 1 MG tablet Take 1 tablet (1 mg total) by mouth as needed for anxiety. Take 1  tablet prior to leaving for MRI and can repeat dose if needed prior to MRI.   ondansetron (ZOFRAN-ODT) 4 MG disintegrating tablet 4mg  ODT q4 hours prn nausea/vomit   valACYclovir (VALTREX) 500 MG tablet TAKE 1 CAPLET BY MOUTH TWICE DAILY AS NEEDED   [DISCONTINUED] Continuous Blood Gluc Sensor (FREESTYLE LIBRE 3 SENSOR) MISC PLACE ONE SENSOR ONTO THE SKIN EVERY 14 DAYS     Allergies:   Allegra [fexofenadine hcl], Metformin and related, Sulfa antibiotics, and Aspirin   Social History   Socioeconomic History   Marital status: Single    Spouse name: Not on file   Number of children: Not on file   Years of education: Not on file   Highest education level: Not on file  Occupational History   Not on file  Tobacco Use   Smoking status: Former    Types: Cigarettes    Quit date: 01/30/2022    Years since quitting: 0.8   Smokeless tobacco: Never  Vaping Use   Vaping Use: Never used  Substance and Sexual Activity   Alcohol use: Not Currently    Alcohol/week: 4.0 - 6.0 standard drinks of alcohol    Types: 2 - 3 Glasses of wine, 2 -  3 Shots of liquor per week    Comment: OCCAS   Drug use: No   Sexual activity: Yes    Birth control/protection: Pill  Other Topics Concern   Not on file  Social History Narrative   Are you right handed or left handed? RIGHT   Are you currently employed ?    What is your current occupation?    Do you live at [name] alone?no   Who lives with you? mother   What type of home do you live in: 1 story or 2 story? two   Caffeine occas   Social Determinants of Health   Financial Resource Strain: Not on file  Food Insecurity: Not on file  Transportation Needs: Not on file  Physical Activity: Not on file  Stress: Not on file  Social Connections: Not on file     Family History: The patient's family history includes Breast cancer in her paternal aunt and paternal grandmother; Cancer in her father; Diabetes in her mother; Hyperlipidemia in her mother; Hypertension  in her mother and sister. There is no history of Colon polyps, Colon cancer, Esophageal cancer, Rectal cancer, or Stomach cancer.  ROS:   Please see the history of present illness.     All other systems reviewed and are negative.  EKGs/Labs/Other Studies Reviewed:    The following studies were reviewed today: Cardiac Studies & Procedures       ECHOCARDIOGRAM  ECHOCARDIOGRAM COMPLETE 08/09/2022  Narrative ECHOCARDIOGRAM REPORT    Patient Name:   Danielle [name] Danielle [name] Date of Exam: 08/09/2022 Medical Rec #:  [id number]             Height:       69.8 in Accession #:    [id number]            Weight:       177.0 lb Date of Birth:  12/24/1975             BSA:          1.977 m Patient Age:    46 years              BP:           120/82 mmHg Patient Gender: F                     HR:           92 bpm. Exam Location:  [address]  Procedure: 2D Echo, Cardiac Doppler, Color Doppler, 3D Echo and Strain Analysis  Indications:    Syncope R55  History:        Patient has no prior history of Echocardiogram examinations. Risk Factors:Dyslipidemia and Diabetes.  Sonographer:    Thurman Coyer RDCS Referring Phys: [id number] Sherly Brodbeck C Ceilidh Torregrossa  IMPRESSIONS   1. Left ventricular ejection fraction, by estimation, is 55 to 60%. The left ventricle has normal function. The left ventricle has no regional wall motion abnormalities. Left ventricular diastolic parameters were normal. The average left ventricular global longitudinal strain is -20.7 %. The global longitudinal strain is normal. 2. Right ventricular systolic function is normal. The right ventricular size is normal. Tricuspid regurgitation signal is inadequate for assessing PA pressure. 3. The mitral valve is normal in structure. Mild mitral valve regurgitation. No evidence of mitral stenosis. 4. The aortic valve is tricuspid. Aortic valve regurgitation is not visualized. No aortic stenosis is present. 5. The inferior vena cava is normal in size  with greater than 50% respiratory  variability, suggesting right atrial pressure of 3 mmHg.  FINDINGS Left Ventricle: Left ventricular ejection fraction, by estimation, is 55 to 60%. The left ventricle has normal function. The left ventricle has no regional wall motion abnormalities. The average left ventricular global longitudinal strain is -20.7 %. The global longitudinal strain is normal. The left ventricular internal cavity size was normal in size. There is no left ventricular hypertrophy. Left ventricular diastolic parameters were normal.  Right Ventricle: The right ventricular size is normal. No increase in right ventricular wall thickness. Right ventricular systolic function is normal. Tricuspid regurgitation signal is inadequate for assessing PA pressure.  Left Atrium: Left atrial size was normal in size.  Right Atrium: Right atrial size was normal in size.  Pericardium: There is no evidence of pericardial effusion.  Mitral Valve: The mitral valve is normal in structure. Mild mitral valve regurgitation. No evidence of mitral valve stenosis.  Tricuspid Valve: The tricuspid valve is normal in structure. Tricuspid valve regurgitation is not demonstrated.  Aortic Valve: The aortic valve is tricuspid. Aortic valve regurgitation is not visualized. No aortic stenosis is present.  Pulmonic Valve: The pulmonic valve was normal in structure. Pulmonic valve regurgitation is not visualized.  Aorta: The aortic root is normal in size and structure.  Venous: The inferior vena cava is normal in size with greater than 50% respiratory variability, suggesting right atrial pressure of 3 mmHg.  IAS/Shunts: No atrial level shunt detected by color flow Doppler.   LEFT VENTRICLE PLAX 2D LVIDd:         4.60 cm   Diastology LVIDs:         3.20 cm   LV e' medial:    7.62 cm/s LV PW:         0.80 cm   LV E/e' medial:  12.5 LV IVS:        0.80 cm   LV e' lateral:   10.30 cm/s LVOT diam:     2.10 cm   LV  E/e' lateral: 9.3 LV SV:         53 LV SV Index:   27        2D Longitudinal Strain LVOT Area:     3.46 cm  2D Strain GLS Avg:     -20.7 %  3D Volume EF: 3D EF:        57 % LV EDV:       125 ml LV ESV:       54 ml LV SV:        71 ml  RIGHT VENTRICLE RV Basal diam:  3.20 cm RV Mid diam:    2.80 cm RV S prime:     11.70 cm/s TAPSE (M-mode): 2.3 cm  LEFT ATRIUM             Index        RIGHT ATRIUM          Index LA diam:        3.80 cm 1.92 cm/m   RA Area:     9.86 cm LA Vol (A2C):   43.4 ml 21.95 ml/m  RA Volume:   19.50 ml 9.86 ml/m LA Vol (A4C):   32.3 ml 16.34 ml/m LA Biplane Vol: 38.7 ml 19.58 ml/m AORTIC VALVE LVOT Vmax:   91.20 cm/s LVOT Vmean:  58.200 cm/s LVOT VTI:    0.152 m  AORTA Ao Root diam: 3.30 cm Ao Asc diam:  3.20 cm  MITRAL VALVE MV Area (PHT): 4.06  cm    SHUNTS MV Decel Time: 187 msec    Systemic VTI:  0.15 m MV E velocity: 95.40 cm/s  Systemic Diam: 2.10 cm MV A velocity: 83.30 cm/s MV E/A ratio:  1.15  Dalton McleanMD Electronically signed by Wilfred Lacy Signature Date/Time: 08/09/2022/1:16:37 PM    Final    MONITORS  LONG TERM MONITOR (3-14 DAYS) 08/09/2022  Narrative   Normal sinus rhythm with average heart rate of 97 bpm.   Rate decreases to 69 bpm during sleep.   Rare PACs and PVCs.   No adverse arrhythmias detected.   Patch Wear Time:  13 days and 18 hours (2024-01-22T16:26:22-0500 to 2024-02-05T10:58:56-0500)  Patient had a min HR of 69 bpm, max HR of 150 bpm, and avg HR of 97 bpm. Predominant underlying rhythm was Sinus Rhythm. Isolated SVEs were rare (<1.0%), and no SVE Couplets or SVE Triplets were present. Isolated VEs were rare (<1.0%), and no VE Couplets or VE Triplets were present.            EKG: No new  Recent Labs: 04/19/2022: ALT 21; BUN 12; Creatinine, Ser 0.82; Hemoglobin 11.4; Magnesium 2.0; Platelets 382; Potassium 4.0; Sodium 138 11/01/2022: TSH 0.20  Recent Lipid Panel    Component Value  Date/Time   CHOL 215 (H) 05/18/2022 0757   TRIG 82.0 05/18/2022 0757   HDL 44.50 05/18/2022 0757   CHOLHDL 5 05/18/2022 0757   VLDL 16.4 05/18/2022 0757   LDLCALC 154 (H) 05/18/2022 0757   LDLCALC 128 (H) 02/05/2020 1358     Risk Assessment/Calculations:               Physical Exam:    VS:  BP 98/64   Pulse 93   Ht 5' 9.25" (1.759 m)   Wt 182 lb 12.8 oz (82.9 kg)   LMP 10/29/2022 (Approximate)   SpO2 99%   BMI 26.80 kg/m     Wt Readings from Last 3 Encounters:  11/29/22 182 lb 12.8 oz (82.9 kg)  10/06/22 179 lb (81.2 kg)  09/22/22 182 lb (82.6 kg)     GEN:  Well nourished, well developed in no acute distress HEENT: Normal NECK: No JVD; No carotid bruits LYMPHATICS: No lymphadenopathy CARDIAC: RRR, no murmurs, rubs, gallops RESPIRATORY:  Clear to auscultation without rales, wheezing or rhonchi  ABDOMEN: Soft, non-tender, non-distended MUSCULOSKELETAL:  No edema; No deformity  SKIN: Warm and dry NEUROLOGIC:  Alert and oriented x 3 PSYCHIATRIC:  Normal affect   ASSESSMENT:    1. Dizziness    PLAN:    In order of problems listed above:  Syncope - Recurrent episodes March 2021, November 2021, February 2022.  If she can get to a fan she usually is fine.  Has some nausea vomiting.  Discussed salt liberalization.  ZIO reassuring.  Echocardiogram reassuring.  Reassuring workup.  Continue with conservative treatment strategies.  She is off of spironolactone.  She did state that after blowing her nose she did start to have some vestibular like changes.  She requested referral to ear nose and throat.  We will go ahead and get this established for her.            Medication Adjustments/Labs and Tests Ordered: Current medicines are reviewed at length with the patient today.  Concerns regarding medicines are outlined above.  Orders Placed This Encounter  Procedures   Ambulatory referral to ENT   No orders of the defined types were placed in this  encounter.   Patient Instructions  Medication Instructions:  The current medical regimen is effective;  continue present plan and medications.  *If you need a refill on your cardiac medications before your next appointment, please call your pharmacy*   You have been referred to Ear, Nose and Throat MD for further evaluation and treatment.  Follow-Up: At [facility], you and your health needs are our priority.  As part of our continuing mission to provide you with exceptional heart care, we have created designated Provider Care Teams.  These Care Teams include your primary Cardiologist (physician) and Advanced Practice Providers (APPs -  Physician Assistants and Nurse Practitioners) who all work together to provide you with the care you need, when you need it.  We recommend signing up for the patient portal called "MyChart".  Sign up information is provided on this After Visit Summary.  MyChart is used to connect with patients for Virtual Visits (Telemedicine).  Patients are able to view lab/test results, encounter notes, upcoming appointments, etc.  Non-urgent messages can be sent to your provider as well.   To learn more about what you can do with MyChart, go to [email or web address].    Your next appointment:   Follow up with Dr Anne Fu as needed.    Signed, Donato Schultz, MD  11/29/2022 11:12 AM    [facility]

## 2022-12-20 ENCOUNTER — Other Ambulatory Visit (INDEPENDENT_AMBULATORY_CARE_PROVIDER_SITE_OTHER): Payer: 59

## 2022-12-20 DIAGNOSIS — E890 Postprocedural hypothyroidism: Secondary | ICD-10-CM | POA: Diagnosis not present

## 2022-12-20 LAB — TSH: TSH: 5.24 u[IU]/mL (ref 0.35–5.50)

## 2022-12-20 LAB — T4, FREE: Free T4: 0.82 ng/dL (ref 0.60–1.60)

## 2022-12-28 ENCOUNTER — Encounter: Payer: Self-pay | Admitting: Adult Health
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
## 2023-01-06 ENCOUNTER — Telehealth (INDEPENDENT_AMBULATORY_CARE_PROVIDER_SITE_OTHER): Payer: 59 | Admitting: Adult Health

## 2023-01-06 ENCOUNTER — Encounter: Payer: Self-pay | Admitting: Adult Health

## 2023-01-06 VITALS — Ht 69.25 in | Wt 187.0 lb

## 2023-01-06 DIAGNOSIS — F419 Anxiety disorder, unspecified: Secondary | ICD-10-CM | POA: Diagnosis not present

## 2023-01-06 DIAGNOSIS — F32A Depression, unspecified: Secondary | ICD-10-CM

## 2023-01-06 MED ORDER — BUPROPION HCL ER (XL) 300 MG PO TB24
300.0000 mg | ORAL_TABLET | Freq: Every day | ORAL | 1 refills | Status: DC
Start: 2023-01-06 — End: 2023-09-13

## 2023-01-06 NOTE — Progress Notes (Signed)
Virtual Visit via Video Note  I connected with Danielle [name] on 01/06/23 at  3:45 PM EDT by a video enabled telemedicine application and verified that I am speaking with the correct person using two identifiers.  Location patient: home Location provider:work or home office Persons participating in the virtual visit: patient, provider  I discussed the limitations of evaluation and management by telemedicine and the availability of in person appointments. The patient expressed understanding and agreed to proceed.   HPI: 47 year old female who is being evaluated today for anxiety and depression.  She is currently maintained on Wellbutrin 150 mg extended release and had been doing well with this dose for quite some time.  She reports that her stress level has been elevated which has exacerbated anxiety and depression.  Precipitating factors include that her mother's declining health as well as burnout from work.  She does report crying episodes with sadness and happiness but states "I can cry at the drop of a hat".  He has not been exercising much due to her work schedule and then going back and forth between the hospital and home to see her father.  She is trying to journal, do breathing exercises and meditation.   ROS: See pertinent positives and negatives per HPI.  Past Medical History:  Diagnosis Date   Anxiety    R/T dx   Asthma    dx 2003=1st asthma attack 10/2021   Diabetes mellitus without complication (HCC)    on meds   HPV in female    Hyperlipidemia    diet controlled/exercise   Migraine    No blood products    Polycystic ovary syndrome    Seasonal allergies    Thyroid disease    on meds    Past Surgical History:  Procedure Laterality Date   dnc     EXCISION VAGINAL CYST     removal of genital warts   WISDOM TOOTH EXTRACTION      Family History  Problem Relation Age of Onset   Hyperlipidemia Mother    Hypertension Mother    Diabetes Mother    Cancer Father         lung tumor/ lung removed   Hypertension Sister    Breast cancer Paternal Aunt    Breast cancer Paternal Grandmother    Colon polyps Neg Hx    Colon cancer Neg Hx    Esophageal cancer Neg Hx    Rectal cancer Neg Hx    Stomach cancer Neg Hx        Current Outpatient Medications:    ACCU-CHEK GUIDE test strip, USE TO TEST BLOOD SUGAR EVERY  MORNING AT NOON AND EVERY NIGHT  AT BEDTIME, Disp: 100 strip, Rfl: 11   albuterol (VENTOLIN HFA) 108 (90 Base) MCG/ACT inhaler, Inhale 1-2 puffs into the lungs every 6 (six) hours as needed for wheezing or shortness of breath., Disp: 1 each, Rfl: 1   Blood Glucose Monitoring Suppl DEVI, 1 each by Does not apply route in the morning, at noon, and at bedtime. May substitute to any manufacturer covered by patient's insurance. Use to check blood sugar TID, Disp: 1 each, Rfl: 0   buPROPion (WELLBUTRIN XL) 300 MG 24 hr tablet, Take 1 tablet (300 mg total) by mouth daily., Disp: 90 tablet, Rfl: 1   Ferrous Sulfate (IRON PO), Take 1 tablet by mouth daily at 6 (six) AM. Up to 2 tabs depending on blood loss level, Disp: , Rfl:    fluticasone (FLONASE) 50  MCG/ACT nasal spray, Place 2 sprays into both nostrils daily., Disp: 16 g, Rfl: 0   levothyroxine (SYNTHROID) 112 MCG tablet, Take 1 tablet (112 mcg total) by mouth daily before breakfast., Disp: 45 tablet, Rfl: 3   LORazepam (ATIVAN) 1 MG tablet, Take 1 tablet (1 mg total) by mouth as needed for anxiety. Take 1 tablet prior to leaving for MRI and can repeat dose if needed prior to MRI., Disp: 2 tablet, Rfl: 0   ondansetron (ZOFRAN-ODT) 4 MG disintegrating tablet, 4mg  ODT q4 hours prn nausea/vomit, Disp: 20 tablet, Rfl: 0   valACYclovir (VALTREX) 500 MG tablet, TAKE 1 CAPLET BY MOUTH TWICE DAILY AS NEEDED, Disp: , Rfl:   EXAM:  VITALS per patient if applicable:  GENERAL: alert, oriented, appears well and in no acute distress  HEENT: atraumatic, conjunttiva clear, no obvious abnormalities on inspection of  external nose and ears  NECK: normal movements of the head and neck  LUNGS: on inspection no signs of respiratory distress, breathing rate appears normal, no obvious gross SOB, gasping or wheezing  CV: no obvious cyanosis  MS: moves all visible extremities without noticeable abnormality  PSYCH/NEURO: pleasant and cooperative, no obvious depression or anxiety, speech and thought processing grossly intact  ASSESSMENT AND PLAN:  Discussed the following assessment and plan:  1. Anxiety and depression -I will increase her dose of Wellbutrin to 300 mg extended release daily.  She will follow-up in 30 days for her physical exam and we will reevaluate then.  She knows to contact me if she has any issues in the meantime - buPROPion (WELLBUTRIN XL) 300 MG 24 hr tablet; Take 1 tablet (300 mg total) by mouth daily.  Dispense: 90 tablet; Refill: 1      I discussed the assessment and treatment plan with the patient. The patient was provided an opportunity to ask questions and all were answered. The patient agreed with the plan and demonstrated an understanding of the instructions.   The patient was advised to call back or seek an in-person evaluation if the symptoms worsen or if the condition fails to improve as anticipated.   Shirline Frees, NP

## 2023-01-12 ENCOUNTER — Encounter: Payer: Self-pay | Admitting: Neurology

## 2023-01-12 ENCOUNTER — Ambulatory Visit: Payer: 59 | Admitting: Neurology

## 2023-01-12 VITALS — BP 122/84 | HR 91 | Resp 18 | Ht 69.25 in | Wt 184.0 lb

## 2023-01-12 DIAGNOSIS — R42 Dizziness and giddiness: Secondary | ICD-10-CM | POA: Diagnosis not present

## 2023-01-12 DIAGNOSIS — F419 Anxiety disorder, unspecified: Secondary | ICD-10-CM

## 2023-01-12 DIAGNOSIS — R29818 Other symptoms and signs involving the nervous system: Secondary | ICD-10-CM | POA: Diagnosis not present

## 2023-01-12 NOTE — Patient Instructions (Signed)
You mentioned your father has neuropathy and asked about supplements:  Alpha lipoic acid 600mg  daily has some research data suggesting it helps with nerve health. No major side effects other than <1% of people report upset stomach. This can be taken twice per day (1200mg  daily) if no relief obtained. You can buy this over the counter or online.   The physicians and staff at [facility] are committed to providing excellent care. You may receive a survey requesting feedback about your experience at our office. We strive to receive "very good" responses to the survey questions. If you feel that your experience would prevent you from giving the office a "very good " response, please contact our office to try to remedy the situation. We may be reached at [phone]. Thank you for taking the time out of your busy day to complete the survey.  Jacquelyne Balint, MD [facility]

## 2023-01-20 ENCOUNTER — Encounter: Payer: Self-pay | Admitting: Neurology

## 2023-01-26 ENCOUNTER — Encounter (INDEPENDENT_AMBULATORY_CARE_PROVIDER_SITE_OTHER): Payer: Self-pay

## 2023-02-04 ENCOUNTER — Encounter: Payer: Self-pay | Admitting: Adult Health

## 2023-02-07 NOTE — Telephone Encounter (Signed)
Please advise 

## 2023-02-08 ENCOUNTER — Ambulatory Visit (INDEPENDENT_AMBULATORY_CARE_PROVIDER_SITE_OTHER): Payer: 59 | Admitting: Adult Health

## 2023-02-08 ENCOUNTER — Encounter: Payer: Self-pay | Admitting: Adult Health

## 2023-02-08 VITALS — BP 120/80 | HR 85 | Temp 98.0°F | Ht 69.5 in | Wt 185.0 lb

## 2023-02-08 DIAGNOSIS — F419 Anxiety disorder, unspecified: Secondary | ICD-10-CM | POA: Diagnosis not present

## 2023-02-08 DIAGNOSIS — E890 Postprocedural hypothyroidism: Secondary | ICD-10-CM

## 2023-02-08 DIAGNOSIS — E1165 Type 2 diabetes mellitus with hyperglycemia: Secondary | ICD-10-CM | POA: Diagnosis not present

## 2023-02-08 DIAGNOSIS — Z0000 Encounter for general adult medical examination without abnormal findings: Secondary | ICD-10-CM

## 2023-02-08 DIAGNOSIS — F32A Depression, unspecified: Secondary | ICD-10-CM

## 2023-02-08 DIAGNOSIS — B353 Tinea pedis: Secondary | ICD-10-CM

## 2023-02-08 MED ORDER — TERBINAFINE HCL 250 MG PO TABS
250.0000 mg | ORAL_TABLET | Freq: Every day | ORAL | 0 refills | Status: DC
Start: 2023-02-08 — End: 2023-03-10

## 2023-02-08 NOTE — Progress Notes (Signed)
Subjective:    Patient ID: Jeanine Bransom, female    DOB: 24-Jul-1975, 47 y.o.   MRN: [id number]  HPI Patient presents for yearly preventative medicine examination. She is a pleasant 47 year old female who  has a past medical history of Anxiety, Asthma, Diabetes mellitus without complication (HCC), HPV in female, Hyperlipidemia, Migraine, No blood products, Polycystic ovary syndrome, Seasonal allergies, and Thyroid disease.  DM Type 2 - diagnosed in August 2023. She is currently diet controlled. She was on Metformin and glipizide and each made her blood sugars drop. She does report that her blood sugars have been in the low 100's. She is working on diet and exercises regularly.  Lab Results  Component Value Date   HGBA1C 6.3 (A) 08/04/2022   Hypothyroidism - managed with Synthroid 112 mcg. This is managed by endocrinology  Lab Results  Component Value Date   TSH 5.24 12/20/2022   Anxiety and Depression - She is currently maintained on Wellbutrin 300 mg XR. This was increased about a month ago due to increased anxiety and depression d/t her father's declining health and burnout from work. She reports that since increasing the Wellbutrin she has noticed improvement in her mental health. She is happy where is currently.   Athletes foot - bilateral feet  worse on the right. Has used various OTC creams and lotions without improvement   All immunizations and health maintenance protocols were reviewed with the patient and needed orders were placed.  Appropriate screening laboratory values were ordered for the patient including screening of hyperlipidemia, renal function and hepatic function. If indicated by BPH, a PSA was ordered.  Medication reconciliation,  past medical history, social history, problem list and allergies were reviewed in detail with the patient  Goals were established with regard to weight loss, exercise, and  diet in compliance with medications Wt Readings from Last  3 Encounters:  02/08/23 185 lb (83.9 kg)  01/12/23 184 lb (83.5 kg)  01/06/23 187 lb (84.8 kg)   She is up to to date on routine colon cancer screening and GYN exams   Review of Systems  Constitutional: Negative.   HENT: Negative.    Eyes: Negative.   Respiratory: Negative.    Cardiovascular: Negative.   Gastrointestinal: Negative.   Endocrine: Negative.   Genitourinary: Negative.   Musculoskeletal: Negative.   Skin:  Positive for wound.  Allergic/Immunologic: Negative.   Neurological: Negative.   Hematological: Negative.   Psychiatric/Behavioral: Negative.     Past Medical History:  Diagnosis Date   Anxiety    R/T dx   Asthma    dx 2003=1st asthma attack 10/2021   Diabetes mellitus without complication (HCC)    on meds   HPV in female    Hyperlipidemia    diet controlled/exercise   Migraine    No blood products    Polycystic ovary syndrome    Seasonal allergies    Thyroid disease    on meds    Social History   Socioeconomic History   Marital status: Single    Spouse name: Not on file   Number of children: Not on file   Years of education: Not on file   Highest education level: Master's degree (e.g., MA, MS, MEng, MEd, MSW, MBA)  Occupational History   Not on file  Tobacco Use   Smoking status: Former    Current packs/day: 0.00    Types: Cigarettes    Quit date: 01/30/2022    Years since quitting: 1.0  Smokeless tobacco: Never  Vaping Use   Vaping status: Never Used  Substance and Sexual Activity   Alcohol use: Not Currently    Alcohol/week: 4.0 - 6.0 standard drinks of alcohol    Types: 2 - 3 Glasses of wine, 2 - 3 Shots of liquor per week    Comment: OCCAS   Drug use: No   Sexual activity: Yes    Birth control/protection: Pill  Other Topics Concern   Not on file  Social History Narrative   Are you right handed or left handed? RIGHT   Are you currently employed ?    What is your current occupation?    Do you live at home alone?no   Who lives  with you? mother   What type of home do you live in: 1 story or 2 story? two   Caffeine occas   Social Determinants of Health   Financial Resource Strain: Low Risk  (02/04/2023)   Overall Financial Resource Strain (CARDIA)    Difficulty of Paying Living Expenses: Not very hard  Food Insecurity: No Food Insecurity (02/04/2023)   Hunger Vital Sign    Worried About Running Out of Food in the Last Year: Never true    Ran Out of Food in the Last Year: Never true  Transportation Needs: No Transportation Needs (02/04/2023)   PRAPARE - Administrator, Civil Service (Medical): No    Lack of Transportation (Non-Medical): No  Physical Activity: Insufficiently Active (02/04/2023)   Exercise Vital Sign    Days of Exercise per Week: 3 days    Minutes of Exercise per Session: 30 min  Stress: Stress Concern Present (02/04/2023)   [employer] of Occupational Health - Occupational Stress Questionnaire    Feeling of Stress : To some extent  Social Connections: Socially Isolated (02/04/2023)   Social Connection and Isolation Panel [NHANES]    Frequency of Communication with Friends and Family: More than three times a week    Frequency of Social Gatherings with Friends and Family: Twice a week    Attends Religious Services: Never    Database administrator or Organizations: No    Attends Engineer, structural: Not on file    Marital Status: Divorced  Intimate Partner Violence: Not At Risk (09/19/2022)   Received from [employer], [facility]   HITS    Over the last 12 months how often did your partner physically hurt you?: 1    Over the last 12 months how often did your partner insult you or talk down to you?: 1    Over the last 12 months how often did your partner threaten you with physical harm?: 1    Over the last 12 months how often did your partner scream or curse at you?: 1    Past Surgical History:  Procedure Laterality Date   dnc     EXCISION VAGINAL CYST     removal of  genital warts   WISDOM TOOTH EXTRACTION      Family History  Problem Relation Age of Onset   Hyperlipidemia Mother    Hypertension Mother    Diabetes Mother    Cancer Father        lung tumor/ lung removed   Hypertension Sister    Breast cancer Paternal Aunt    Breast cancer Paternal Grandmother    Colon polyps Neg Hx    Colon cancer Neg Hx    Esophageal cancer Neg Hx    Rectal  cancer Neg Hx    Stomach cancer Neg Hx     Allergies  Allergen Reactions   Allegra [Fexofenadine Hcl]     Swelling occurs   Metformin And Related Diarrhea   Sulfa Antibiotics    Aspirin Rash    Baby asprin; as a child, rash from dyes     Current Outpatient Medications on File Prior to Visit  Medication Sig Dispense Refill   ACCU-CHEK GUIDE test strip USE TO TEST BLOOD SUGAR EVERY  MORNING AT NOON AND EVERY NIGHT  AT BEDTIME 100 strip 11   albuterol (VENTOLIN HFA) 108 (90 Base) MCG/ACT inhaler Inhale 1-2 puffs into the lungs every 6 (six) hours as needed for wheezing or shortness of breath. 1 each 1   Blood Glucose Monitoring Suppl DEVI 1 each by Does not apply route in the morning, at noon, and at bedtime. May substitute to any manufacturer covered by patient's insurance. Use to check blood sugar TID 1 each 0   buPROPion (WELLBUTRIN XL) 300 MG 24 hr tablet Take 1 tablet (300 mg total) by mouth daily. 90 tablet 1   Ferrous Sulfate (IRON PO) Take 1 tablet by mouth daily at 6 (six) AM. Up to 2 tabs depending on blood loss level     fluticasone (FLONASE) 50 MCG/ACT nasal spray Place 2 sprays into both nostrils daily. 16 g 0   levothyroxine (SYNTHROID) 112 MCG tablet Take 1 tablet (112 mcg total) by mouth daily before breakfast. 45 tablet 3   LORazepam (ATIVAN) 1 MG tablet Take 1 tablet (1 mg total) by mouth as needed for anxiety. Take 1 tablet prior to leaving for MRI and can repeat dose if needed prior to MRI. 2 tablet 0   ondansetron (ZOFRAN-ODT) 4 MG disintegrating tablet 4mg  ODT q4 hours prn  nausea/vomit 20 tablet 0   valACYclovir (VALTREX) 500 MG tablet TAKE 1 CAPLET BY MOUTH TWICE DAILY AS NEEDED     No current facility-administered medications on file prior to visit.    BP 120/80   Pulse 85   Temp 98 F (36.7 C) (Oral)   Ht 5' 9.5" (1.765 m)   Wt 185 lb (83.9 kg)   LMP 01/28/2023   SpO2 98%   BMI 26.93 kg/m       Objective:   Physical Exam Vitals and nursing note reviewed.  Constitutional:      General: She is not in acute distress.    Appearance: Normal appearance. She is well-developed. She is not ill-appearing.  HENT:     Head: Normocephalic and atraumatic.     Right Ear: Tympanic membrane, ear canal and external ear normal. There is no impacted cerumen.     Left Ear: Tympanic membrane, ear canal and external ear normal. There is no impacted cerumen.     Nose: Nose normal. No congestion or rhinorrhea.     Mouth/Throat:     Mouth: Mucous membranes are moist.     Pharynx: Oropharynx is clear. No oropharyngeal exudate or posterior oropharyngeal erythema.  Eyes:     General:        Right eye: No discharge.        Left eye: No discharge.     Extraocular Movements: Extraocular movements intact.     Conjunctiva/sclera: Conjunctivae normal.     Pupils: Pupils are equal, round, and reactive to light.  Neck:     Thyroid: No thyromegaly.     Vascular: No carotid bruit.     Trachea: No tracheal deviation.  Cardiovascular:     Rate and Rhythm: Normal rate and regular rhythm.     Pulses: Normal pulses.     Heart sounds: Normal heart sounds. No murmur heard.    No friction rub. No gallop.  Pulmonary:     Effort: Pulmonary effort is normal. No respiratory distress.     Breath sounds: Normal breath sounds. No stridor. No wheezing, rhonchi or rales.  Chest:     Chest wall: No tenderness.  Abdominal:     General: Abdomen is flat. Bowel sounds are normal. There is no distension.     Palpations: Abdomen is soft. There is no mass.     Tenderness: There is no  abdominal tenderness. There is no right CVA tenderness, left CVA tenderness, guarding or rebound.     Hernia: No hernia is present.  Musculoskeletal:        General: No swelling, tenderness, deformity or signs of injury. Normal range of motion.     Cervical back: Normal range of motion and neck supple.     Right lower leg: No edema.     Left lower leg: No edema.  Feet:     Right foot:     Skin integrity: Skin breakdown present.     Left foot:     Skin integrity: Skin breakdown present.     Comments: Athletes foot noted on both feet.  Lymphadenopathy:     Cervical: No cervical adenopathy.  Skin:    General: Skin is warm and dry.     Capillary Refill: Capillary refill takes less than 2 seconds.     Coloration: Skin is not jaundiced or pale.     Findings: No bruising, erythema, lesion or rash.  Neurological:     General: No focal deficit present.     Mental Status: She is alert and oriented to person, place, and time.     Cranial Nerves: No cranial nerve deficit.     Sensory: No sensory deficit.     Motor: No weakness.     Coordination: Coordination normal.     Gait: Gait normal.     Deep Tendon Reflexes: Reflexes normal.  Psychiatric:        Mood and Affect: Mood normal.        Behavior: Behavior normal.        Thought Content: Thought content normal.        Judgment: Judgment normal.        Assessment & Plan:  1. Routine general medical examination at a health care facility Today patient counseled on age appropriate routine health concerns for screening and prevention, each reviewed and up to date or declined. Immunizations reviewed and up to date or declined. Labs ordered and reviewed. Risk factors for depression reviewed and negative. Hearing function and visual acuity are intact. ADLs screened and addressed as needed. Functional ability and level of safety reviewed and appropriate. Education, counseling and referrals performed based on assessed risks today. Patient provided  with a copy of personalized plan for preventive services. - Follow up in one year for CPE or sooner if needed - Continue to eat healthy and exercise   2. Type 2 diabetes mellitus with hyperglycemia, without long-term current use of insulin (HCC) - Consider adding agent  - Likely follow up in 6 months  - CBC with Differential/Platelet; Future - Comprehensive metabolic panel; Future - Lipid panel; Future - Hemoglobin A1c; Future - Microalbumin/Creatinine Ratio, Urine; Future  3. Anxiety and depression - Doing very well  with wellbutrin 300 mg  - CBC with Differential/Platelet; Future - Comprehensive metabolic panel; Future - Lipid panel; Future - Hemoglobin A1c; Future - Microalbumin/Creatinine Ratio, Urine; Future  4. Postablative hypothyroidism - Per endocrinology  - CBC with Differential/Platelet; Future - Comprehensive metabolic panel; Future - Lipid panel; Future - Hemoglobin A1c; Future - Microalbumin/Creatinine Ratio, Urine; Future  5. Tinea pedis of both feet  - terbinafine (LAMISIL) 250 MG tablet; Take 1 tablet (250 mg total) by mouth daily.  Dispense: 14 tablet; Refill: 0   Shirline Frees, NP

## 2023-02-14 ENCOUNTER — Other Ambulatory Visit (INDEPENDENT_AMBULATORY_CARE_PROVIDER_SITE_OTHER): Payer: 59

## 2023-02-14 ENCOUNTER — Other Ambulatory Visit: Payer: Self-pay | Admitting: Otolaryngology

## 2023-02-14 DIAGNOSIS — F32A Depression, unspecified: Secondary | ICD-10-CM

## 2023-02-14 DIAGNOSIS — E1165 Type 2 diabetes mellitus with hyperglycemia: Secondary | ICD-10-CM | POA: Diagnosis not present

## 2023-02-14 DIAGNOSIS — F419 Anxiety disorder, unspecified: Secondary | ICD-10-CM | POA: Diagnosis not present

## 2023-02-14 DIAGNOSIS — R42 Dizziness and giddiness: Secondary | ICD-10-CM

## 2023-02-14 DIAGNOSIS — E890 Postprocedural hypothyroidism: Secondary | ICD-10-CM | POA: Diagnosis not present

## 2023-02-14 LAB — CBC WITH DIFFERENTIAL/PLATELET
Basophils Absolute: 0 10*3/uL (ref 0.0–0.1)
Basophils Relative: 0.7 % (ref 0.0–3.0)
Eosinophils Absolute: 0.1 10*3/uL (ref 0.0–0.7)
Eosinophils Relative: 2.9 % (ref 0.0–5.0)
HCT: 39.4 % (ref 36.0–46.0)
Hemoglobin: 13.1 g/dL (ref 12.0–15.0)
Lymphocytes Relative: 40.3 % (ref 12.0–46.0)
Lymphs Abs: 1.8 10*3/uL (ref 0.7–4.0)
MCHC: 33.2 g/dL (ref 30.0–36.0)
MCV: 96.3 fl (ref 78.0–100.0)
Monocytes Absolute: 0.5 10*3/uL (ref 0.1–1.0)
Monocytes Relative: 11.9 % (ref 3.0–12.0)
Neutro Abs: 2 10*3/uL (ref 1.4–7.7)
Neutrophils Relative %: 44.2 % (ref 43.0–77.0)
Platelets: 225 10*3/uL (ref 150.0–400.0)
RBC: 4.09 Mil/uL (ref 3.87–5.11)
RDW: 15.9 % — ABNORMAL HIGH (ref 11.5–15.5)
WBC: 4.6 10*3/uL (ref 4.0–10.5)

## 2023-02-14 LAB — COMPREHENSIVE METABOLIC PANEL
ALT: 19 U/L (ref 0–35)
AST: 16 U/L (ref 0–37)
Albumin: 4.1 g/dL (ref 3.5–5.2)
Alkaline Phosphatase: 51 U/L (ref 39–117)
BUN: 9 mg/dL (ref 6–23)
CO2: 24 mEq/L (ref 19–32)
Calcium: 9.6 mg/dL (ref 8.4–10.5)
Chloride: 107 mEq/L (ref 96–112)
Creatinine, Ser: 0.91 mg/dL (ref 0.40–1.20)
GFR: 75.68 mL/min (ref >60.00)
Glucose, Bld: 116 mg/dL — ABNORMAL HIGH (ref 70–99)
Potassium: 3.8 mEq/L (ref 3.5–5.1)
Sodium: 139 mEq/L (ref 135–145)
Total Bilirubin: 0.4 mg/dL (ref 0.2–1.2)
Total Protein: 7 g/dL (ref 6.0–8.3)

## 2023-02-14 LAB — MICROALBUMIN / CREATININE URINE RATIO
Creatinine,U: 255.1 mg/dL
Microalb Creat Ratio: 0.4 mg/g (ref 0.0–30.0)
Microalb, Ur: 0.9 mg/dL (ref 0.0–1.9)

## 2023-02-14 LAB — LIPID PANEL
Cholesterol: 197 mg/dL (ref 0–200)
HDL: 52.9 mg/dL (ref >39.00)
LDL Cholesterol: 133 mg/dL — ABNORMAL HIGH (ref 0–99)
NonHDL: 143.76
Total CHOL/HDL Ratio: 4
Triglycerides: 56 mg/dL (ref 0.0–149.0)
VLDL: 11.2 mg/dL (ref 0.0–40.0)

## 2023-02-14 LAB — HEMOGLOBIN A1C: Hgb A1c MFr Bld: 6 % (ref 4.6–6.5)

## 2023-02-15 ENCOUNTER — Other Ambulatory Visit: Payer: 59

## 2023-03-07 ENCOUNTER — Ambulatory Visit
Admission: RE | Admit: 2023-03-07 | Discharge: 2023-03-07 | Disposition: A | Discharge status: Home / Self Care | Payer: 59 | Source: Ambulatory Visit | Attending: Otolaryngology

## 2023-03-07 DIAGNOSIS — R42 Dizziness and giddiness: Secondary | ICD-10-CM

## 2023-03-09 ENCOUNTER — Telehealth: Payer: Self-pay | Admitting: Adult Health

## 2023-03-09 NOTE — Telephone Encounter (Signed)
Please advise 

## 2023-03-09 NOTE — Telephone Encounter (Signed)
Pt called in and stated she was prescribed terbinafine for her athlete's foot. However, she is still struggling with it, she states the prescription helped some, but it did not clear it up. She would like to know if Kandee Keen could prescribe another medication to try. I offered to make an appt with pt, she declined.

## 2023-03-10 ENCOUNTER — Other Ambulatory Visit: Payer: Self-pay | Admitting: Adult Health

## 2023-03-10 DIAGNOSIS — B353 Tinea pedis: Secondary | ICD-10-CM

## 2023-03-10 MED ORDER — TERBINAFINE HCL 250 MG PO TABS
250.0000 mg | ORAL_TABLET | Freq: Every day | ORAL | 0 refills | Status: DC
Start: 2023-03-10 — End: 2023-08-11

## 2023-03-10 NOTE — Telephone Encounter (Signed)
Patient notified of update  and verbalized understanding. 

## 2023-03-14 LAB — HM DIABETES EYE EXAM

## 2023-03-24 ENCOUNTER — Telehealth: Payer: Self-pay | Admitting: Adult Health

## 2023-03-24 ENCOUNTER — Ambulatory Visit: Payer: 59 | Admitting: Family Medicine

## 2023-03-24 ENCOUNTER — Encounter: Payer: Self-pay | Admitting: Family Medicine

## 2023-03-24 VITALS — BP 96/70 | HR 111 | Temp 98.9°F | Wt 186.0 lb

## 2023-03-24 DIAGNOSIS — M7989 Other specified soft tissue disorders: Secondary | ICD-10-CM

## 2023-03-24 NOTE — Telephone Encounter (Signed)
Pt's sister called stating she thinks patient may have cellulitis, says area is spreading and would like that to be taken into consideration. Sister is requesting call Kendal Hymen [phone], advised she is not on the [facility]

## 2023-03-24 NOTE — Telephone Encounter (Signed)
Pt has been scheduled and notified of update.

## 2023-03-24 NOTE — Progress Notes (Signed)
Subjective:    Patient ID: Danielle [name], female    DOB: 1975/12/04, 47 y.o.   MRN: [id number]  HPI Here for the sudden onset 2 days ago of swelling and pain in the right thigh. 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 She first felt a pain in the inner right thigh and then some swelling. Then the swelling and pain began spreading down the inner right thigh almost to the knee. Yesterday she began to notice redness and warmth over these areas. She has taken some 500 mg Naproxen and has elevated and iced the leg. No fever or chest pain or SOB.    Review of Systems  Constitutional: Negative.   Respiratory: Negative.    Cardiovascular:  Positive for leg swelling. Negative for chest pain and palpitations.  Skin:  Positive for color change. Negative for rash and wound.       Objective:   Physical Exam Constitutional:      Comments: She limps when she walks   Cardiovascular:     Rate and Rhythm: Normal rate and regular rhythm.     Pulses: Normal pulses.     Heart sounds: Normal heart sounds.  Pulmonary:     Effort: Pulmonary effort is normal.     Breath sounds: Normal breath sounds.  Musculoskeletal:     Comments: There is an area on the inner right thigh about 10 cm in diameter that is red, indurated, and tender. There is another area just proximal to the knee that is tender, but there is no redness or warmth. No swelling below the knee.   Neurological:     Mental Status: She is alert.           Assessment & Plan:  Swelling, induration, and pain in the right thigh of uncertain etiology. We need to rule out a DVT, so we will send her for a stat venous doppler today. She can continue to ice the  areas and take Naproxen as needed. She will seek attention immediately if she has any chest pain or SOB. Gershon Crane, MD

## 2023-03-24 NOTE — Telephone Encounter (Signed)
Patient called inquiring as to when she will be notified of her xray appointment for possible blood clot.  Would also like the location where this will be done at.  Please advise at [phone].

## 2023-03-25 ENCOUNTER — Telehealth: Payer: Self-pay | Admitting: *Deleted

## 2023-03-25 ENCOUNTER — Ambulatory Visit (HOSPITAL_COMMUNITY)
Admission: RE | Admit: 2023-03-25 | Discharge: 2023-03-25 | Disposition: A | Discharge status: Home / Self Care | Payer: 59 | Source: Ambulatory Visit | Attending: Internal Medicine | Admitting: Internal Medicine

## 2023-03-25 DIAGNOSIS — M7989 Other specified soft tissue disorders: Secondary | ICD-10-CM | POA: Diagnosis present

## 2023-03-25 MED ORDER — METHYLPREDNISOLONE 4 MG PO TBPK: ORAL_TABLET | ORAL | 0 refills | Status: DC

## 2023-03-25 NOTE — Telephone Encounter (Signed)
Okay that's good news. Please inform the patient about this. I am sending in a Medrol dose pack (steroids) for her to take. Let's check her again on Monday

## 2023-03-25 NOTE — Telephone Encounter (Signed)
CALL REPORT: Mandy called from [facility] stating the patient is negative for DVT.  She stated the patient questioned what she should do at this point due to redness and is aware someone will contact her with further instructions [phone])?  Message sent to Dr Clent Ridges.

## 2023-03-25 NOTE — Telephone Encounter (Signed)
Pt notified about Rx for Medro Dose pack,verbalized understanding

## 2023-03-25 NOTE — Telephone Encounter (Signed)
Pt is still in pain and would like a callback

## 2023-04-01 ENCOUNTER — Encounter: Payer: Self-pay | Admitting: Family Medicine

## 2023-04-01 NOTE — Telephone Encounter (Signed)
Pt calling to check on this request, says the knots on her legs are starting to itch.

## 2023-04-03 ENCOUNTER — Encounter: Payer: Self-pay | Admitting: Internal Medicine

## 2023-04-04 MED ORDER — METHYLPREDNISOLONE 4 MG PO TBPK: ORAL_TABLET | ORAL | 0 refills | Status: DC

## 2023-04-04 NOTE — Telephone Encounter (Signed)
Rx called in pt notified of update.  ?

## 2023-04-04 NOTE — Telephone Encounter (Signed)
Pt called, very upset, stating it is unacceptable that she is still waiting for a response.  I spoke to CMA Doristine Church) who stated she would look into it and return Pt's call at her earliest convenience, as she is assisting another provider today.

## 2023-04-04 NOTE — Telephone Encounter (Signed)
Call in Prednisone 10 mg BID for 10 days. Follow up as needed

## 2023-04-05 MED ORDER — LEVOTHYROXINE SODIUM 112 MCG PO TABS: 112.0000 ug | ORAL_TABLET | Freq: Every day | ORAL | 3 refills | Status: DC

## 2023-04-11 ENCOUNTER — Ambulatory Visit: Payer: 59 | Admitting: Internal Medicine

## 2023-04-11 ENCOUNTER — Encounter: Payer: Self-pay | Admitting: Adult Health

## 2023-04-11 ENCOUNTER — Encounter: Payer: Self-pay | Admitting: Internal Medicine

## 2023-04-11 VITALS — BP 120/72 | HR 96 | Ht 69.5 in | Wt 189.4 lb

## 2023-04-11 DIAGNOSIS — Z8639 Personal history of other endocrine, nutritional and metabolic disease: Secondary | ICD-10-CM

## 2023-04-11 DIAGNOSIS — E890 Postprocedural hypothyroidism: Secondary | ICD-10-CM | POA: Diagnosis not present

## 2023-04-11 DIAGNOSIS — E282 Polycystic ovarian syndrome: Secondary | ICD-10-CM

## 2023-04-11 NOTE — Patient Instructions (Addendum)
Please come back for labs in 5-6 weeks.  Please continue: - Levothyroxine 112 mcg daily   Take the thyroid hormone every day, with water, at least 30 minutes before breakfast, separated by at least 4 hours from: - acid reflux medications - calcium - iron - multivitamins  Please come back for a follow-up appointment in 6 months.
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
## 2023-04-12 ENCOUNTER — Encounter: Payer: Self-pay | Admitting: Adult Health

## 2023-04-12 ENCOUNTER — Ambulatory Visit: Payer: 59 | Admitting: Adult Health

## 2023-04-12 VITALS — BP 110/70 | HR 95 | Temp 98.4°F | Ht 69.5 in | Wt 188.0 lb

## 2023-04-12 DIAGNOSIS — R234 Changes in skin texture: Secondary | ICD-10-CM | POA: Diagnosis not present

## 2023-04-12 DIAGNOSIS — L299 Pruritus, unspecified: Secondary | ICD-10-CM | POA: Diagnosis not present

## 2023-04-12 MED ORDER — HYDROXYZINE HCL 50 MG PO TABS
50.0000 mg | ORAL_TABLET | Freq: Three times a day (TID) | ORAL | 0 refills | Status: DC | PRN
Start: 2023-04-12 — End: 2023-05-24

## 2023-04-12 NOTE — Telephone Encounter (Signed)
FYI
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
## 2023-05-19 ENCOUNTER — Encounter: Payer: Self-pay | Admitting: Internal Medicine

## 2023-05-19 NOTE — Telephone Encounter (Signed)
Please advise her appt is at 9:30am on 11/27

## 2023-05-24 ENCOUNTER — Other Ambulatory Visit: Payer: Self-pay | Admitting: Adult Health

## 2023-05-24 DIAGNOSIS — L299 Pruritus, unspecified: Secondary | ICD-10-CM

## 2023-05-25 ENCOUNTER — Other Ambulatory Visit: Payer: Self-pay

## 2023-05-25 ENCOUNTER — Other Ambulatory Visit: Payer: 59

## 2023-05-25 DIAGNOSIS — E890 Postprocedural hypothyroidism: Secondary | ICD-10-CM

## 2023-05-26 LAB — T3, FREE: T3, Free: 2.5 pg/mL (ref 2.3–4.2)

## 2023-05-26 LAB — TSH: TSH: 6.62 m[IU]/L — ABNORMAL HIGH

## 2023-05-26 LAB — T4, FREE: Free T4: 1 ng/dL (ref 0.8–1.8)

## 2023-05-30 ENCOUNTER — Other Ambulatory Visit: Payer: Self-pay | Admitting: Internal Medicine

## 2023-05-30 DIAGNOSIS — E890 Postprocedural hypothyroidism: Secondary | ICD-10-CM

## 2023-05-30 MED ORDER — LEVOTHYROXINE SODIUM 125 MCG PO TABS: 125.0000 ug | ORAL_TABLET | Freq: Every day | ORAL | 3 refills | Status: AC

## 2023-06-13 ENCOUNTER — Encounter: Payer: Self-pay | Admitting: Adult Health

## 2023-06-13 NOTE — Telephone Encounter (Signed)
 Care team updated and letter sent for eye exam notes.

## 2023-06-14 ENCOUNTER — Encounter: Payer: Self-pay | Admitting: Adult Health

## 2023-07-25 ENCOUNTER — Emergency Department (HOSPITAL_BASED_OUTPATIENT_CLINIC_OR_DEPARTMENT_OTHER): Payer: 59 | Admitting: Radiology

## 2023-07-25 ENCOUNTER — Emergency Department (HOSPITAL_BASED_OUTPATIENT_CLINIC_OR_DEPARTMENT_OTHER)
Admission: EM | Admit: 2023-07-25 | Discharge: 2023-07-25 | Disposition: A | Discharge status: Home / Self Care | Payer: 59 | Attending: Emergency Medicine | Admitting: Emergency Medicine

## 2023-07-25 ENCOUNTER — Other Ambulatory Visit: Payer: Self-pay

## 2023-07-25 ENCOUNTER — Encounter (HOSPITAL_BASED_OUTPATIENT_CLINIC_OR_DEPARTMENT_OTHER): Payer: Self-pay | Admitting: Emergency Medicine

## 2023-07-25 DIAGNOSIS — M7918 Myalgia, other site: Secondary | ICD-10-CM

## 2023-07-25 DIAGNOSIS — M545 Low back pain, unspecified: Secondary | ICD-10-CM | POA: Diagnosis present

## 2023-07-25 DIAGNOSIS — M25511 Pain in right shoulder: Secondary | ICD-10-CM | POA: Insufficient documentation

## 2023-07-25 DIAGNOSIS — R079 Chest pain, unspecified: Secondary | ICD-10-CM | POA: Diagnosis not present

## 2023-07-25 MED ORDER — METHOCARBAMOL 750 MG PO TABS: 750.0000 mg | ORAL_TABLET | Freq: Four times a day (QID) | ORAL | 0 refills | Status: DC
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
## 2023-07-25 NOTE — Discharge Instructions (Signed)
Follow up with your doctor if pain is no better in 3-4 days. Take the medication as prescribed. Continue ibuprofen and/or Tylenol in addition to the Robaxin.
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
## 2023-08-11 ENCOUNTER — Ambulatory Visit (INDEPENDENT_AMBULATORY_CARE_PROVIDER_SITE_OTHER): Payer: 59

## 2023-08-11 ENCOUNTER — Ambulatory Visit: Payer: 59 | Admitting: Adult Health

## 2023-08-11 VITALS — BP 110/80 | HR 95 | Temp 98.5°F

## 2023-08-11 DIAGNOSIS — M25562 Pain in left knee: Secondary | ICD-10-CM | POA: Diagnosis not present

## 2023-08-11 DIAGNOSIS — M549 Dorsalgia, unspecified: Secondary | ICD-10-CM | POA: Diagnosis not present

## 2023-08-11 DIAGNOSIS — M25561 Pain in right knee: Secondary | ICD-10-CM | POA: Diagnosis not present

## 2023-08-11 DIAGNOSIS — M545 Low back pain, unspecified: Secondary | ICD-10-CM

## 2023-08-11 MED ORDER — NAPROXEN 500 MG PO TABS
500.0000 mg | ORAL_TABLET | Freq: Two times a day (BID) | ORAL | 0 refills | Status: DC
Start: 2023-08-11 — End: 2023-08-30

## 2023-08-11 MED ORDER — CYCLOBENZAPRINE HCL 10 MG PO TABS
10.0000 mg | ORAL_TABLET | Freq: Every day | ORAL | 0 refills | Status: AC
Start: 2023-08-11 — End: ?
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
## 2023-08-12 ENCOUNTER — Encounter: Payer: Self-pay | Admitting: Adult Health

## 2023-08-28 ENCOUNTER — Other Ambulatory Visit: Payer: Self-pay | Admitting: Adult Health

## 2023-08-28 DIAGNOSIS — M549 Dorsalgia, unspecified: Secondary | ICD-10-CM

## 2023-08-28 DIAGNOSIS — M25561 Pain in right knee: Secondary | ICD-10-CM

## 2023-08-28 DIAGNOSIS — M545 Low back pain, unspecified: Secondary | ICD-10-CM

## 2023-08-28 DIAGNOSIS — M25562 Pain in left knee: Secondary | ICD-10-CM

## 2023-08-29 ENCOUNTER — Telehealth: Admitting: Family Medicine

## 2023-08-29 ENCOUNTER — Encounter

## 2023-08-29 DIAGNOSIS — R110 Nausea: Secondary | ICD-10-CM

## 2023-08-29 DIAGNOSIS — R059 Cough, unspecified: Secondary | ICD-10-CM

## 2023-08-29 DIAGNOSIS — R062 Wheezing: Secondary | ICD-10-CM

## 2023-08-29 DIAGNOSIS — R6889 Other general symptoms and signs: Secondary | ICD-10-CM

## 2023-08-29 NOTE — Progress Notes (Signed)
  Because of all your symptoms (from previous EVisit this morning and this one) your condition warrants further evaluation and I recommend that you be seen in a face-to-face visit.   NOTE: There will be NO CHARGE for this E-Visit   If you are having a true medical emergency, please call 911.

## 2023-08-29 NOTE — Progress Notes (Signed)
  Because given your chest discomfort, possible flu, and wheezing your condition warrants further evaluation and I recommend that you be seen in a face-to-face visit at your PCP office or local Urgent Care.   NOTE: There will be NO CHARGE for this E-Visit

## 2023-08-30 ENCOUNTER — Encounter: Payer: Self-pay | Admitting: Adult Health

## 2023-08-30 ENCOUNTER — Telehealth (INDEPENDENT_AMBULATORY_CARE_PROVIDER_SITE_OTHER): Admitting: Adult Health

## 2023-08-30 VITALS — Ht 69.0 in | Wt 185.0 lb

## 2023-08-30 DIAGNOSIS — M545 Low back pain, unspecified: Secondary | ICD-10-CM | POA: Diagnosis not present

## 2023-08-30 DIAGNOSIS — R110 Nausea: Secondary | ICD-10-CM

## 2023-08-30 DIAGNOSIS — J988 Other specified respiratory disorders: Secondary | ICD-10-CM | POA: Diagnosis not present

## 2023-08-30 MED ORDER — ONDANSETRON HCL 4 MG PO TABS: 4.0000 mg | ORAL_TABLET | Freq: Three times a day (TID) | ORAL | 1 refills | Status: AC | PRN

## 2023-08-30 MED ORDER — PREDNISONE 20 MG PO TABS
20.0000 mg | ORAL_TABLET | Freq: Every day | ORAL | 0 refills | Status: DC
Start: 2023-08-30 — End: 2023-10-10

## 2023-08-30 NOTE — Telephone Encounter (Signed)
 Okay for refill?
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
## 2023-09-11 ENCOUNTER — Other Ambulatory Visit: Payer: Self-pay | Admitting: Adult Health

## 2023-09-11 DIAGNOSIS — F32A Depression, unspecified: Secondary | ICD-10-CM

## 2023-09-12 ENCOUNTER — Telehealth: Payer: Self-pay | Admitting: Adult Health

## 2023-09-12 ENCOUNTER — Other Ambulatory Visit: Payer: Self-pay | Admitting: Adult Health

## 2023-09-12 DIAGNOSIS — M25562 Pain in left knee: Secondary | ICD-10-CM

## 2023-09-12 DIAGNOSIS — M549 Dorsalgia, unspecified: Secondary | ICD-10-CM

## 2023-09-12 DIAGNOSIS — M25561 Pain in right knee: Secondary | ICD-10-CM

## 2023-09-12 DIAGNOSIS — F419 Anxiety disorder, unspecified: Secondary | ICD-10-CM

## 2023-09-12 DIAGNOSIS — M545 Low back pain, unspecified: Secondary | ICD-10-CM

## 2023-09-12 NOTE — Telephone Encounter (Signed)
 Pt needs a refill on the Bupropion 300 mg 24 hr tablet 90-day supply. Pt is completely out.  Pharmacy states the 1 refill she has left, they cancelled it.    Pharmacy- [facility] on [address]

## 2023-09-13 MED ORDER — BUPROPION HCL ER (XL) 300 MG PO TB24: 300.0000 mg | ORAL_TABLET | Freq: Every day | ORAL | 1 refills | Status: DC

## 2023-09-13 NOTE — Telephone Encounter (Signed)
 Rx refilled.
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
## 2023-09-14 ENCOUNTER — Ambulatory Visit: Admitting: Physical Therapy

## 2023-09-14 ENCOUNTER — Encounter: Payer: Self-pay | Admitting: Physical Therapy

## 2023-09-14 DIAGNOSIS — M5459 Other low back pain: Secondary | ICD-10-CM

## 2023-09-14 DIAGNOSIS — M6281 Muscle weakness (generalized): Secondary | ICD-10-CM | POA: Diagnosis not present

## 2023-09-28 ENCOUNTER — Ambulatory Visit: Admitting: Physical Therapy
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
## 2023-09-29 ENCOUNTER — Ambulatory Visit: Attending: Adult Health

## 2023-09-29 DIAGNOSIS — M6281 Muscle weakness (generalized): Secondary | ICD-10-CM | POA: Diagnosis present

## 2023-09-29 DIAGNOSIS — M5459 Other low back pain: Secondary | ICD-10-CM | POA: Diagnosis present

## 2023-10-05 ENCOUNTER — Ambulatory Visit: Admitting: Physical Therapy

## 2023-10-06 ENCOUNTER — Encounter: Payer: Self-pay | Admitting: Physical Therapy

## 2023-10-06 ENCOUNTER — Ambulatory Visit: Admitting: Physical Therapy

## 2023-10-06 DIAGNOSIS — M5459 Other low back pain: Secondary | ICD-10-CM | POA: Diagnosis not present

## 2023-10-06 DIAGNOSIS — M6281 Muscle weakness (generalized): Secondary | ICD-10-CM
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
## 2023-10-10 ENCOUNTER — Encounter: Payer: Self-pay | Admitting: Internal Medicine

## 2023-10-10 ENCOUNTER — Ambulatory Visit: Payer: 59 | Admitting: Internal Medicine

## 2023-10-10 VITALS — BP 118/68 | HR 90 | Ht 69.0 in | Wt 188.0 lb

## 2023-10-10 DIAGNOSIS — E282 Polycystic ovarian syndrome: Secondary | ICD-10-CM

## 2023-10-10 DIAGNOSIS — E890 Postprocedural hypothyroidism: Secondary | ICD-10-CM

## 2023-10-10 DIAGNOSIS — Z8639 Personal history of other endocrine, nutritional and metabolic disease: Secondary | ICD-10-CM

## 2023-10-10 MED ORDER — METFORMIN HCL 500 MG PO TABS: 500.0000 mg | ORAL_TABLET | Freq: Two times a day (BID) | ORAL | 3 refills | Status: DC
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
## 2023-10-10 NOTE — Patient Instructions (Addendum)
 Please stop at the lab.  Please continue: - Levothyroxine 125 mcg daily   Take the thyroid hormone every day, with water, at least 30 minutes before breakfast, separated by at least 4 hours from: - acid reflux medications - calcium - iron - multivitamins  You can start back on Metformin 500 mg 2x a day.  Please come back for a follow-up appointment in 6 months.
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
## 2023-10-11 ENCOUNTER — Encounter: Payer: Self-pay | Admitting: Internal Medicine

## 2023-10-11 ENCOUNTER — Ambulatory Visit

## 2023-10-11 DIAGNOSIS — M5459 Other low back pain: Secondary | ICD-10-CM

## 2023-10-11 DIAGNOSIS — M6281 Muscle weakness (generalized): Secondary | ICD-10-CM

## 2023-10-11 LAB — HEMOGLOBIN A1C
Hgb A1c MFr Bld: 6.6 % — ABNORMAL HIGH (ref <5.7)
Mean Plasma Glucose: 143 mg/dL
eAG (mmol/L): 7.9 mmol/L

## 2023-10-11 LAB — T4, FREE: Free T4: 1.5 ng/dL (ref 0.8–1.8)

## 2023-10-11 LAB — TSH: TSH: 1.13 m[IU]/L

## 2023-10-13 ENCOUNTER — Telehealth: Admitting: Physician Assistant

## 2023-10-13 DIAGNOSIS — R052 Subacute cough: Secondary | ICD-10-CM | POA: Diagnosis not present

## 2023-10-13 MED ORDER — FAMOTIDINE 20 MG PO TABS
20.0000 mg | ORAL_TABLET | Freq: Two times a day (BID) | ORAL | 0 refills | Status: AC
Start: 2023-10-13 — End: ?

## 2023-10-13 MED ORDER — FLUTICASONE PROPIONATE 50 MCG/ACT NA SUSP
2.0000 | Freq: Every day | NASAL | 0 refills | Status: AC
Start: 2023-10-13 — End: ?

## 2023-10-13 NOTE — Progress Notes (Signed)
 Virtual Visit Consent   Avantika Leslee Home, you are scheduled for a virtual visit with a [facility] provider today. Just as with appointments in the office, your consent must be obtained to participate. Your consent will be active for this visit and any virtual visit you may have with one of our providers in the next 365 days. If you have a MyChart account, a copy of this consent can be sent to you electronically.  As this is a virtual visit, video technology does not allow for your provider to perform a traditional examination. This may limit your provider's ability to fully assess your condition. If your provider identifies any concerns that need to be evaluated in person or the need to arrange testing (such as labs, EKG, etc.), we will make arrangements to do so. Although advances in technology are sophisticated, we cannot ensure that it will always work on either your end or our end. If the connection with a video visit is poor, the visit may have to be switched to a telephone visit. With either a video or telephone visit, we are not always able to ensure that we have a secure connection.  By engaging in this virtual visit, you consent to the provision of healthcare and authorize for your insurance to be billed (if applicable) for the services provided during this visit. Depending on your insurance coverage, you may receive a charge related to this service.  I need to obtain your verbal consent now. Are you willing to proceed with your visit today? Vanette Leslee Home has provided verbal consent on 10/13/2023 for a virtual visit (video or telephone). Piedad Climes, [place]  Date: 10/13/2023 2:11 PM   Virtual Visit via Video Note   I, Piedad Climes, connected with  Jadae Steinke  ([id number], [birth year]) on 10/13/23 at  2:00 PM EDT by a video-enabled telemedicine application and verified that I am speaking with the correct person using two identifiers.  Location: Patient:  Virtual Visit Location Patient: Home Provider: Virtual Visit Location Provider: Home Office   I discussed the limitations of evaluation and management by telemedicine and the availability of in person appointments. The patient expressed understanding and agreed to proceed.    History of Present Illness: Makya Leslee Home is a 48 y.o. who identifies as a female who was assigned female at birth, and is being seen today for cough for the past 6 weeks. Endorses being around someone with a viral URI in late February. Starting 2/27 with cough, congestion, wheezing and diarrhea.  Notes symptoms resolved except for the cough. Notes it has improved substantially but is not fully resolving. Notes intermittent, dry cough associated with some post-nasal drip and at other times, a sensation of reflux, without true "heartburn". Has history of seasonal allergies, but not regularly taking anything for this. Denies fatigue, weight loss.   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   Right leg pain 10/08/2013   Depression 12/04/2012   H/O Graves' disease 05/08/2012    Allergies:  Allergies  Allergen Reactions   Allegra [Fexofenadine Hcl]     Swelling occurs  Sulfa Antibiotics    Aspirin Rash    Baby asprin; as a child, rash from dyes    Medications:  Current Outpatient Medications:    ACCU-CHEK GUIDE test strip, USE TO TEST BLOOD SUGAR EVERY  MORNING AT NOON AND EVERY NIGHT  AT BEDTIME, Disp: 100 strip, Rfl: 11   albuterol (VENTOLIN HFA) 108 (90 Base) MCG/ACT inhaler, Inhale 1-2 puffs into the lungs every 6  (six) hours as needed for wheezing or shortness of breath., Disp: 1 each, Rfl: 1   Blood Glucose Monitoring Suppl DEVI, 1 each by Does not apply route in the morning, at noon, and at bedtime. May substitute to any manufacturer covered by patient's insurance. Use to check blood sugar TID, Disp: 1 each, Rfl: 0   buPROPion (WELLBUTRIN XL) 300 MG 24 hr tablet, Take 1 tablet (300 mg total) by mouth daily., Disp: 90 tablet, Rfl: 1   cyclobenzaprine (FLEXERIL) 10 MG tablet, Take 1 tablet (10 mg total) by mouth at bedtime., Disp: 15 tablet, Rfl: 0   Ferrous Sulfate (IRON PO), Take 1 tablet by mouth daily at 6 (six) AM. Up to 2 tabs depending on blood loss level, Disp: , Rfl:    finasteride (PROPECIA) 1 MG tablet, Take 1 mg by mouth daily., Disp: , Rfl:    fluticasone (FLONASE) 50 MCG/ACT nasal spray, Place 2 sprays into both nostrils daily., Disp: 16 g, Rfl: 0   hydrOXYzine (ATARAX) 50 MG tablet, TAKE 1 TABLET(50 MG) BY MOUTH THREE TIMES DAILY AS NEEDED, Disp: 30 tablet, Rfl: 0   levothyroxine (SYNTHROID) 125 MCG tablet, Take 1 tablet (125 mcg total) by mouth daily before breakfast., Disp: 45 tablet, Rfl: 3   metFORMIN (GLUCOPHAGE) 500 MG tablet, Take 1 tablet (500 mg total) by mouth 2 (two) times daily with a meal., Disp: 180 tablet, Rfl: 3   naproxen (NAPROSYN) 500 MG tablet, TAKE 1 TABLET(500 MG) BY MOUTH TWICE DAILY WITH A MEAL, Disp: 30 tablet, Rfl: 0   ondansetron (ZOFRAN) 4 MG tablet, Take 1 tablet (4 mg total) by mouth every 8 (eight) hours as needed for nausea or vomiting., Disp: 20 tablet, Rfl: 1   ondansetron (ZOFRAN-ODT) 4 MG disintegrating tablet, 4mg  ODT q4 hours prn nausea/vomit, Disp: 20 tablet, Rfl: 0   valACYclovir (VALTREX) 500 MG tablet, TAKE 1 CAPLET BY MOUTH TWICE DAILY AS NEEDED, Disp: , Rfl:   Observations/Objective: Patient is well-developed, well-nourished in no acute distress.  Resting comfortably  at home.  Head is normocephalic, atraumatic.  No labored breathing.  Speech is  clear and coherent with logical content.  Patient is alert and oriented at baseline.   Assessment and Plan: 1. Subacute cough (Primary)  Discussed this is likely multifactorial including some residual post-infectious inflammation, coupled with mild seasonal allergies with PND that is exacerbating reflux and contributing to cough. No alarm signs or symptoms present. Supportive measures and OTC medications reviewed. Will start Flonase daily and Famotidine BID over next 7-10 days. PCP follow-up discussed. If not resolving, will need further evaluation including likely CXR.   Follow Up Instructions: I discussed the assessment and treatment plan with the patient. The patient was provided an opportunity to ask questions and all were answered. The patient agreed with the plan and demonstrated an understanding of the instructions.  A copy of instructions were sent to the patient via MyChart unless otherwise noted below.   The patient was advised to call back or seek an in-person evaluation if the symptoms worsen or if the condition  fails to improve as anticipated.    Hyla Maillard, PA-C

## 2023-10-13 NOTE — Patient Instructions (Signed)
 Danielle [name], thank you for joining Piedad Climes, PA-C for today's virtual visit.  While this provider is not your primary care provider (PCP), if your PCP is located in our provider database this encounter information will be shared with them immediately following your visit.   A [facility] MyChart account gives you access to today's visit and all your visits, tests, and labs performed at [facility] " click here if you don't have a [facility] MyChart account or go to [email or web address]  Consent: (Patient) Danielle [name] provided verbal consent for this virtual visit at the beginning of the encounter.  Current Medications:  Current Outpatient Medications:    ACCU-CHEK GUIDE test strip, USE TO TEST BLOOD SUGAR EVERY  MORNING AT NOON AND EVERY NIGHT  AT BEDTIME, Disp: 100 strip, Rfl: 11   albuterol (VENTOLIN HFA) 108 (90 Base) MCG/ACT inhaler, Inhale 1-2 puffs into the lungs every 6 (six) hours as needed for wheezing or shortness of breath., Disp: 1 each, Rfl: 1   Blood Glucose Monitoring Suppl DEVI, 1 each by Does not apply route in the morning, at noon, and at bedtime. May substitute to any manufacturer covered by patient's insurance. Use to check blood sugar TID, Disp: 1 each, Rfl: 0   buPROPion (WELLBUTRIN XL) 300 MG 24 hr tablet, Take 1 tablet (300 mg total) by mouth daily., Disp: 90 tablet, Rfl: 1   cyclobenzaprine (FLEXERIL) 10 MG tablet, Take 1 tablet (10 mg total) by mouth at bedtime., Disp: 15 tablet, Rfl: 0   Ferrous Sulfate (IRON PO), Take 1 tablet by mouth daily at 6 (six) AM. Up to 2 tabs depending on blood loss level, Disp: , Rfl:    finasteride (PROPECIA) 1 MG tablet, Take 1 mg by mouth daily., Disp: , Rfl:    fluticasone (FLONASE) 50 MCG/ACT nasal spray, Place 2 sprays into both nostrils daily., Disp: 16 g, Rfl: 0   hydrOXYzine (ATARAX) 50 MG tablet, TAKE 1 TABLET(50 MG) BY MOUTH THREE TIMES DAILY AS NEEDED, Disp: 30 tablet, Rfl: 0    levothyroxine (SYNTHROID) 125 MCG tablet, Take 1 tablet (125 mcg total) by mouth daily before breakfast., Disp: 45 tablet, Rfl: 3   metFORMIN (GLUCOPHAGE) 500 MG tablet, Take 1 tablet (500 mg total) by mouth 2 (two) times daily with a meal., Disp: 180 tablet, Rfl: 3   naproxen (NAPROSYN) 500 MG tablet, TAKE 1 TABLET(500 MG) BY MOUTH TWICE DAILY WITH A MEAL, Disp: 30 tablet, Rfl: 0   ondansetron (ZOFRAN) 4 MG tablet, Take 1 tablet (4 mg total) by mouth every 8 (eight) hours as needed for nausea or vomiting., Disp: 20 tablet, Rfl: 1   ondansetron (ZOFRAN-ODT) 4 MG disintegrating tablet, 4mg  ODT q4 hours prn nausea/vomit, Disp: 20 tablet, Rfl: 0   valACYclovir (VALTREX) 500 MG tablet, TAKE 1 CAPLET BY MOUTH TWICE DAILY AS NEEDED, Disp: , Rfl:    Medications ordered in this encounter:  No orders of the defined types were placed in this encounter.    *If you need refills on other medications prior to your next appointment, please contact your pharmacy*  Follow-Up: Call back or seek an in-person evaluation if the symptoms worsen or if the condition fails to improve as anticipated.  [facility] [phone]  Other Instructions Please keep hydrated and rest. Follow the dietary recommendations below. Ok to use OTC cough medicine as needed. Start the Famotidine twice daily and the Flonase once daily for 7-10 days.  Follow-up ASAP with your  PCP for any non-resolving, new or worsening symptoms despite treatment.    If you have been instructed to have an in-person evaluation today at a local Urgent Care facility, please use the link below. It will take you to a list of all of our available [facility], including address, phone number and hours of operation. Please do not delay care.  [facility]  If you or a family member do not have a primary care provider, use the link below to schedule a visit and establish care. When you choose a [facility] primary care  physician or advanced practice provider, you gain a long-term partner in health. Find a Primary Care Provider  Learn more about [facility] in-office and virtual care options:  - Get Care Now

## 2023-10-20 ENCOUNTER — Ambulatory Visit
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
## 2023-10-25 ENCOUNTER — Ambulatory Visit

## 2023-10-25 DIAGNOSIS — M5459 Other low back pain: Secondary | ICD-10-CM

## 2023-10-25 DIAGNOSIS — M6281 Muscle weakness (generalized): Secondary | ICD-10-CM

## 2023-11-01 ENCOUNTER — Ambulatory Visit
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
## 2023-11-17 ENCOUNTER — Ambulatory Visit: Attending: Adult Health

## 2023-11-17 DIAGNOSIS — M5459 Other low back pain: Secondary | ICD-10-CM | POA: Diagnosis present

## 2023-11-17 DIAGNOSIS — M6281 Muscle weakness (generalized): Secondary | ICD-10-CM | POA: Insufficient documentation

## 2023-11-29 ENCOUNTER — Encounter: Payer: Self-pay | Admitting: Internal Medicine

## 2023-12-29 ENCOUNTER — Other Ambulatory Visit: Payer: Self-pay | Admitting: Adult Health

## 2023-12-29 DIAGNOSIS — F32A Depression, unspecified: Secondary | ICD-10-CM

## 2024-01-31 ENCOUNTER — Encounter: Payer: Self-pay | Admitting: Adult Health

## 2024-02-01 ENCOUNTER — Encounter: Admitting: Adult Health

## 2024-02-10 ENCOUNTER — Other Ambulatory Visit: Payer: Self-pay | Admitting: Adult Health

## 2024-02-10 DIAGNOSIS — Z1231 Encounter for screening mammogram for malignant neoplasm of breast: Secondary | ICD-10-CM

## 2024-03-05 ENCOUNTER — Ambulatory Visit

## 2024-03-05 ENCOUNTER — Encounter: Payer: Self-pay | Admitting: Internal Medicine

## 2024-03-06 MED ORDER — METFORMIN HCL 500 MG PO TABS: 500.0000 mg | ORAL_TABLET | Freq: Two times a day (BID) | ORAL | 3 refills | Status: AC

## 2024-03-07 ENCOUNTER — Encounter: Payer: Self-pay | Admitting: Adult Health

## 2024-03-07 DIAGNOSIS — M25562 Pain in left knee: Secondary | ICD-10-CM

## 2024-03-07 DIAGNOSIS — F419 Anxiety disorder, unspecified: Secondary | ICD-10-CM

## 2024-03-07 DIAGNOSIS — M549 Dorsalgia, unspecified: Secondary | ICD-10-CM

## 2024-03-07 DIAGNOSIS — M25561 Pain in right knee: Secondary | ICD-10-CM

## 2024-03-07 DIAGNOSIS — M545 Low back pain, unspecified: Secondary | ICD-10-CM

## 2024-03-07 DIAGNOSIS — L299 Pruritus, unspecified: Secondary | ICD-10-CM

## 2024-03-08 MED ORDER — HYDROXYZINE HCL 50 MG PO TABS: ORAL_TABLET | ORAL | 0 refills | Status: DC

## 2024-03-08 MED ORDER — BUPROPION HCL ER (XL) 300 MG PO TB24: ORAL_TABLET | ORAL | 0 refills | Status: DC

## 2024-03-14 ENCOUNTER — Ambulatory Visit: Admitting: Adult Health

## 2024-03-14 ENCOUNTER — Ambulatory Visit: Payer: Self-pay | Admitting: Adult Health

## 2024-03-14 ENCOUNTER — Encounter: Payer: Self-pay | Admitting: Adult Health

## 2024-03-14 ENCOUNTER — Ambulatory Visit
Admission: RE | Admit: 2024-03-14 | Discharge: 2024-03-14 | Disposition: A | Discharge status: Home / Self Care | Source: Ambulatory Visit | Attending: Adult Health | Admitting: Adult Health

## 2024-03-14 VITALS — BP 102/80 | HR 101 | Temp 98.7°F | Ht 69.5 in | Wt 188.0 lb

## 2024-03-14 DIAGNOSIS — Z7984 Long term (current) use of oral hypoglycemic drugs: Secondary | ICD-10-CM

## 2024-03-14 DIAGNOSIS — Z1231 Encounter for screening mammogram for malignant neoplasm of breast: Secondary | ICD-10-CM

## 2024-03-14 DIAGNOSIS — F419 Anxiety disorder, unspecified: Secondary | ICD-10-CM

## 2024-03-14 DIAGNOSIS — E890 Postprocedural hypothyroidism: Secondary | ICD-10-CM | POA: Diagnosis not present

## 2024-03-14 DIAGNOSIS — E119 Type 2 diabetes mellitus without complications: Secondary | ICD-10-CM

## 2024-03-14 DIAGNOSIS — Z0000 Encounter for general adult medical examination without abnormal findings: Secondary | ICD-10-CM

## 2024-03-14 DIAGNOSIS — E785 Hyperlipidemia, unspecified: Secondary | ICD-10-CM | POA: Diagnosis not present

## 2024-03-14 DIAGNOSIS — F32A Depression, unspecified: Secondary | ICD-10-CM

## 2024-03-14 LAB — CBC WITH DIFFERENTIAL/PLATELET
Basophils Absolute: 0.1 K/uL (ref 0.0–0.1)
Basophils Relative: 1.1 % (ref 0.0–3.0)
Eosinophils Absolute: 0.1 K/uL (ref 0.0–0.7)
Eosinophils Relative: 1.6 % (ref 0.0–5.0)
HCT: 42.9 % (ref 36.0–46.0)
Hemoglobin: 14.4 g/dL (ref 12.0–15.0)
Lymphocytes Relative: 34.7 % (ref 12.0–46.0)
Lymphs Abs: 2.1 K/uL (ref 0.7–4.0)
MCHC: 33.7 g/dL (ref 30.0–36.0)
MCV: 95.5 fl (ref 78.0–100.0)
Monocytes Absolute: 0.6 K/uL (ref 0.1–1.0)
Monocytes Relative: 9.5 % (ref 3.0–12.0)
Neutro Abs: 3.1 K/uL (ref 1.4–7.7)
Neutrophils Relative %: 53.1 % (ref 43.0–77.0)
Platelets: 230 K/uL (ref 150.0–400.0)
RBC: 4.49 Mil/uL (ref 3.87–5.11)
RDW: 14.8 % (ref 11.5–15.5)
WBC: 5.9 K/uL (ref 4.0–10.5)

## 2024-03-14 LAB — COMPREHENSIVE METABOLIC PANEL WITH GFR
ALT: 18 U/L (ref 0–35)
AST: 13 U/L (ref 0–37)
Albumin: 4.5 g/dL (ref 3.5–5.2)
Alkaline Phosphatase: 54 U/L (ref 39–117)
BUN: 10 mg/dL (ref 6–23)
CO2: 25 meq/L (ref 19–32)
Calcium: 10.7 mg/dL — ABNORMAL HIGH (ref 8.4–10.5)
Chloride: 103 meq/L (ref 96–112)
Creatinine, Ser: 0.87 mg/dL (ref 0.40–1.20)
GFR: 79.27 mL/min (ref >60.00)
Glucose, Bld: 188 mg/dL — ABNORMAL HIGH (ref 70–99)
Potassium: 4.4 meq/L (ref 3.5–5.1)
Sodium: 137 meq/L (ref 135–145)
Total Bilirubin: 0.3 mg/dL (ref 0.2–1.2)
Total Protein: 7.4 g/dL (ref 6.0–8.3)

## 2024-03-14 LAB — LIPID PANEL
Cholesterol: 228 mg/dL — ABNORMAL HIGH (ref 0–200)
HDL: 65.5 mg/dL (ref >39.00)
LDL Cholesterol: 118 mg/dL — ABNORMAL HIGH (ref 0–99)
NonHDL: 162.58
Total CHOL/HDL Ratio: 3
Triglycerides: 225 mg/dL — ABNORMAL HIGH (ref 0.0–149.0)
VLDL: 45 mg/dL — ABNORMAL HIGH (ref 0.0–40.0)

## 2024-03-14 LAB — TSH: TSH: 3.63 u[IU]/mL (ref 0.35–5.50)

## 2024-03-14 MED ORDER — CITALOPRAM HYDROBROMIDE 10 MG PO TABS: 10.0000 mg | ORAL_TABLET | Freq: Every day | ORAL | 0 refills | Status: DC

## 2024-03-14 NOTE — Patient Instructions (Signed)
 It was great seeing you today   We will follow up with you regarding your lab work   Please let me know if you need anything   I am going to put you on a low dose of Celexa  lets follow up in a month

## 2024-03-14 NOTE — Progress Notes (Addendum)
 Subjective:    Patient ID: Danielle [name], female    DOB: 13-Apr-1976, 48 y.o.   MRN: [id number]  HPI Patient presents for yearly preventative medicine examination. She is a 48 year old female who  has a past medical history of Anxiety, Asthma, Diabetes mellitus without complication (HCC), HPV in female, Hyperlipidemia, Migraine, No blood products, Polycystic ovary syndrome, Seasonal allergies, and Thyroid  disease.  DM Type 2 - diagnosed in August 2023. She is currently diet controlled. She was on Metformin  and glipizide  and each made her blood sugars drop. In April 2025 Endocrinology placed her back on Metformin  500 mg BID for PCOS and DM.  Lab Results  Component Value Date   HGBA1C 6.6 (H) 10/10/2023   HGBA1C 6.0 02/14/2023   HGBA1C 6.3 (A) 08/04/2022   Hypothyroidism - diagnosed in 03/2012 after RAI tx for Graves Disease. She is managed with Synthroid  112 mcg. This is managed by endocrinology. She reports that she is in a research study for hypothyroidism and is taking [place] Thyroid  Lab Results  Component Value Date   TSH 1.13 10/10/2023   Anxiety and Depression - She is currently maintained on Wellbutrin  300 mg XR. She does feel like the medication is working well but she does feel  on edge most of the time and feels anxious doing things that she once enjoyed.   Hyperlipidemia - has refused statins in the past.  Lab Results  Component Value Date   CHOL 197 02/14/2023   HDL 52.90 02/14/2023   LDLCALC 133 (H) 02/14/2023   TRIG 56.0 02/14/2023   CHOLHDL 4 02/14/2023   All immunizations and health maintenance protocols were reviewed with the patient and needed orders were placed. Refused flu shot. Will think about other vaccinations.   Appropriate screening laboratory values were ordered for the patient including screening of hyperlipidemia, renal function and hepatic function.  Medication reconciliation,  past medical history, social history, problem list and allergies  were reviewed in detail with the patient  Goals were established with regard to weight loss, exercise, and  diet in compliance with medications. She has not been exercising or following a diet but plans to get back into it.  Wt Readings from Last 3 Encounters:  03/14/24 188 lb (85.3 kg)  10/10/23 188 lb (85.3 kg)  08/30/23 185 lb (83.9 kg)   She is up to date on routine colon cancer screening, GYN care and she has her mammogram later this morning.   Review of Systems  Constitutional: Negative.   HENT: Negative.    Eyes: Negative.   Respiratory: Negative.    Cardiovascular: Negative.   Gastrointestinal: Negative.   Endocrine: Negative.   Genitourinary: Negative.   Musculoskeletal: Negative.   Skin: Negative.   Allergic/Immunologic: Negative.   Neurological: Negative.   Hematological: Negative.   Psychiatric/Behavioral:  Positive for dysphoric mood and sleep disturbance. The patient is nervous/anxious.    Past Medical History:  Diagnosis Date   Anxiety    R/T dx   Asthma    dx 2003=1st asthma attack 10/2021   Diabetes mellitus without complication (HCC)    on meds   HPV in female    Hyperlipidemia    diet controlled/exercise   Migraine    No blood products    Polycystic ovary syndrome    Seasonal allergies    Thyroid  disease    on meds    Social History   Socioeconomic History   Marital status: Single    Spouse name: Not on  file   Number of children: Not on file   Years of education: Not on file   Highest education level: Master's degree (e.g., MA, MS, MEng, MEd, MSW, MBA)  Occupational History   Not on file  Tobacco Use   Smoking status: Former    Current packs/day: 0.00    Types: Cigarettes    Quit date: 01/30/2022    Years since quitting: 2.1   Smokeless tobacco: Never  Vaping Use   Vaping status: Never Used  Substance and Sexual Activity   Alcohol use: Not Currently    Alcohol/week: 4.0 - 6.0 standard drinks of alcohol    Types: 2 - 3 Glasses of  wine, 2 - 3 Shots of liquor per week    Comment: OCCAS   Drug use: No   Sexual activity: Yes    Birth control/protection: Pill  Other Topics Concern   Not on file  Social History Narrative   Are you right handed or left handed? RIGHT   Are you currently employed ?    What is your current occupation?    Do you live at home alone?no   Who lives with you? mother   What type of home do you live in: 1 story or 2 story? two   Caffeine occas   Social Drivers of Health   Financial Resource Strain: Low Risk  (02/04/2023)   Overall Financial Resource Strain (CARDIA)    Difficulty of Paying Living Expenses: Not very hard  Food Insecurity: No Food Insecurity (02/04/2023)   Hunger Vital Sign    Worried About Running Out of Food in the Last Year: Never true    Ran Out of Food in the Last Year: Never true  Transportation Needs: No Transportation Needs (02/04/2023)   PRAPARE - Administrator, Civil Service (Medical): No    Lack of Transportation (Non-Medical): No  Physical Activity: Insufficiently Active (02/04/2023)   Exercise Vital Sign    Days of Exercise per Week: 3 days    Minutes of Exercise per Session: 30 min  Stress: Stress Concern Present (02/04/2023)   [employer] of Occupational Health - Occupational Stress Questionnaire    Feeling of Stress : To some extent  Social Connections: Socially Isolated (02/04/2023)   Social Connection and Isolation Panel    Frequency of Communication with Friends and Family: More than three times a week    Frequency of Social Gatherings with Friends and Family: Twice a week    Attends Religious Services: Never    Database administrator or Organizations: No    Attends Engineer, structural: Not on file    Marital Status: Divorced  Intimate Partner Violence: Not At Risk (09/19/2022)   Received from [facility]   HITS    Over the last 12 months how often did your partner physically hurt you?: Never    Over the last 12 months how often  did your partner insult you or talk down to you?: Never    Over the last 12 months how often did your partner threaten you with physical harm?: Never    Over the last 12 months how often did your partner scream or curse at you?: Never    Past Surgical History:  Procedure Laterality Date   dnc     EXCISION VAGINAL CYST     removal of genital warts   WISDOM TOOTH EXTRACTION      Family History  Problem Relation Age of Onset   Hyperlipidemia  Mother    Hypertension Mother    Diabetes Mother    Cancer Father        lung tumor/ lung removed   Hypertension Sister    Breast cancer Paternal Aunt    Breast cancer Paternal Grandmother    Colon polyps Neg Hx    Colon cancer Neg Hx    Esophageal cancer Neg Hx    Rectal cancer Neg Hx    Stomach cancer Neg Hx     Allergies  Allergen Reactions   Allegra [Fexofenadine Hcl]     Swelling occurs   Sulfa Antibiotics    Aspirin Rash    Baby asprin; as a child, rash from dyes     Current Outpatient Medications on File Prior to Visit  Medication Sig Dispense Refill   ACCU-CHEK GUIDE test strip USE TO TEST BLOOD SUGAR EVERY  MORNING AT NOON AND EVERY NIGHT  AT BEDTIME 100 strip 11   albuterol  (VENTOLIN  HFA) 108 (90 Base) MCG/ACT inhaler Inhale 1-2 puffs into the lungs every 6 (six) hours as needed for wheezing or shortness of breath. 1 each 1   Blood Glucose Monitoring Suppl DEVI 1 each by Does not apply route in the morning, at noon, and at bedtime. May substitute to any manufacturer covered by patient's insurance. Use to check blood sugar TID 1 each 0   buPROPion  (WELLBUTRIN  XL) 300 MG 24 hr tablet TAKE 1 TABLET(300 MG) BY MOUTH DAILY 90 tablet 0   cyclobenzaprine  (FLEXERIL ) 10 MG tablet Take 1 tablet (10 mg total) by mouth at bedtime. 15 tablet 0   famotidine  (PEPCID ) 20 MG tablet Take 1 tablet (20 mg total) by mouth 2 (two) times daily. 30 tablet 0   Ferrous Sulfate (IRON PO) Take 1 tablet by mouth daily at 6 (six) AM. Up to 2 tabs  depending on blood loss level     finasteride (PROPECIA) 1 MG tablet Take 1 mg by mouth daily.     fluticasone  (FLONASE ) 50 MCG/ACT nasal spray Place 2 sprays into both nostrils daily. 16 g 0   hydrOXYzine  (ATARAX ) 50 MG tablet TAKE 1 TABLET(50 MG) BY MOUTH THREE TIMES DAILY AS NEEDED 90 tablet 0   levothyroxine  (SYNTHROID ) 125 MCG tablet Take 1 tablet (125 mcg total) by mouth daily before breakfast. 45 tablet 3   metFORMIN  (GLUCOPHAGE ) 500 MG tablet Take 1 tablet (500 mg total) by mouth 2 (two) times daily with a meal. 180 tablet 3   naproxen  (NAPROSYN ) 500 MG tablet TAKE 1 TABLET(500 MG) BY MOUTH TWICE DAILY WITH A MEAL 30 tablet 0   ondansetron  (ZOFRAN ) 4 MG tablet Take 1 tablet (4 mg total) by mouth every 8 (eight) hours as needed for nausea or vomiting. 20 tablet 1   ondansetron  (ZOFRAN -ODT) 4 MG disintegrating tablet 4mg  ODT q4 hours prn nausea/vomit 20 tablet 0   valACYclovir  (VALTREX ) 500 MG tablet TAKE 1 CAPLET BY MOUTH TWICE DAILY AS NEEDED     No current facility-administered medications on file prior to visit.    BP 102/80   Pulse (!) 101   Temp 98.7 F (37.1 C) (Oral)   Ht 5' 9.5 (1.765 m)   Wt 188 lb (85.3 kg)   LMP 02/20/2024 (Approximate)   SpO2 97%   BMI 27.36 kg/m       Objective:   Physical Exam Vitals and nursing note reviewed.  Constitutional:      General: She is not in acute distress.    Appearance: Normal appearance. She  is not ill-appearing.  HENT:     Head: Normocephalic and atraumatic.     Right Ear: Tympanic membrane, ear canal and external ear normal. There is no impacted cerumen.     Left Ear: Tympanic membrane, ear canal and external ear normal. There is no impacted cerumen.     Nose: Nose normal. No congestion or rhinorrhea.     Mouth/Throat:     Mouth: Mucous membranes are moist.     Pharynx: Oropharynx is clear.  Eyes:     Extraocular Movements: Extraocular movements intact.     Conjunctiva/sclera: Conjunctivae normal.     Pupils: Pupils  are equal, round, and reactive to light.  Neck:     Vascular: No carotid bruit.  Cardiovascular:     Rate and Rhythm: Normal rate and regular rhythm.     Pulses: Normal pulses.     Heart sounds: No murmur heard.    No friction rub. No gallop.  Pulmonary:     Effort: Pulmonary effort is normal.     Breath sounds: Normal breath sounds.  Abdominal:     General: Abdomen is flat. Bowel sounds are normal. There is no distension.     Palpations: Abdomen is soft. There is no mass.     Tenderness: There is no abdominal tenderness. There is no guarding or rebound.     Hernia: No hernia is present.  Musculoskeletal:        General: Normal range of motion.     Cervical back: Normal range of motion and neck supple.  Lymphadenopathy:     Cervical: No cervical adenopathy.  Skin:    General: Skin is warm and dry.     Capillary Refill: Capillary refill takes less than 2 seconds.  Neurological:     General: No focal deficit present.     Mental Status: She is alert and oriented to person, place, and time.  Psychiatric:        Mood and Affect: Mood normal.        Behavior: Behavior normal.        Thought Content: Thought content normal.        Judgment: Judgment normal.       Assessment & Plan:  1. Routine general medical examination at a health care facility (Primary) Today patient counseled on age appropriate routine health concerns for screening and prevention, each reviewed and up to date or declined. Immunizations reviewed and up to date or declined. Labs ordered and reviewed. Risk factors for depression reviewed and negative. Hearing function and visual acuity are intact. ADLs screened and addressed as needed. Functional ability and level of safety reviewed and appropriate. Education, counseling and referrals performed based on assessed risks today. Patient provided with a copy of personalized plan for preventive services.   2. Diabetes mellitus treated with oral medication (HCC) -  Continue with Metformin . Follow up with Endocrinology as directed  - CBC with Differential/Platelet; Future - Comprehensive metabolic panel with GFR; Future - Lipid panel; Future - TSH; Future - Microalbumin/Creatinine Ratio, Urine; Future  3. Postablative hypothyroidism - Per endocrinology  - CBC with Differential/Platelet; Future - Comprehensive metabolic panel with GFR; Future - Lipid panel; Future - TSH; Future  4. Anxiety and depression    03/14/2024    8:46 AM 02/08/2023    5:03 PM 08/04/2022    3:18 PM  PHQ9 SCORE ONLY  PHQ-9 Total Score 11 0  3      Data saved with a previous flowsheet row  definition      03/14/2024    8:46 AM 08/04/2022    3:19 PM 03/04/2020    3:34 PM  GAD 7 : Generalized Anxiety Score  Nervous, Anxious, on Edge 3 2 3   Control/stop worrying 3 2 3   Worry too much - different things 3 1 3   Trouble relaxing 2 1 3   Restless 3 1 2   Easily annoyed or irritable 3 1 3   Afraid - awful might happen 3 1 3   Total GAD 7 Score 20 9 20   Anxiety Difficulty Very difficult Not difficult at all Not difficult at all   - Will add Celexa  10 mg daily. Follow up in 4-6 weeks to see how she is doing.  - CBC with Differential/Platelet; Future - Comprehensive metabolic panel with GFR; Future - Lipid panel; Future - TSH; Future - citalopram  (CELEXA ) 10 MG tablet; Take 1 tablet (10 mg total) by mouth daily.  Dispense: 90 tablet; Refill: 0  5. Hyperlipidemia, unspecified hyperlipidemia type - Discussed statin. She will think about starting   Michaelann Gunnoe, NP

## 2024-03-22 ENCOUNTER — Encounter: Payer: Self-pay | Admitting: Adult Health

## 2024-03-22 NOTE — Telephone Encounter (Signed)
**Note De-identified  [place] Obfuscation** Please advise 

## 2024-04-10 NOTE — Progress Notes (Deleted)
 Normal patient ID: Danielle [name], female   DOB: 1975/10/21, 48 y.o.   MRN: [id number]  HPI  Danielle [name] is a 48 y.o.-year-old female, returning for f/u for postablative hypothyroidism (dx in 03/2012 after RAI tx for Graves Ds) and PCOS.  She also has diabetes type 2, which is managed by PCP.  Last visit 6 months ago.  Interim history: She was exercising consistently 5-6 days a week >> not now but plans to restart.  No increased urination, nausea, chest pain. She has constipation - on Metamucil.  Hypothyroidism: She has a history of medication noncompliance with levothyroxine . In 04/2018, she was missing levothyroxine  doses and her TSH was high (at that time she had increased stress as she separated from her husband).  She improved compliance afterwards.  Pt is on levothyroxine  125 mcg daily, increased 04/2023: - prev.  Missing doses, but not anymore - in am - fasting - at least 30 min from b'fast - no Ca, PPIs, + occasional multivitamins >4h later - off iron now - stopped Biotin (Hair Skin and Nails), now B complex with 3000 mcg of biotin  Reviewed her TFTs: Lab Results  Component Value Date   TSH 3.63 03/14/2024   TSH 1.13 10/10/2023   TSH 6.62 (H) 05/25/2023   TSH 5.24 12/20/2022   TSH 0.20 (L) 11/01/2022   TSH 0.04 (L) 09/22/2022   TSH 0.05 (L) 09/22/2022   TSH 0.15 (L) 04/02/2022   TSH 9.16 (H) 02/05/2022   TSH 2.18 01/05/2022   FREET4 1.5 10/10/2023   FREET4 1.0 05/25/2023   FREET4 0.82 12/20/2022   FREET4 1.35 11/01/2022   FREET4 1.40 09/22/2022   FREET4 1.30 04/02/2022   FREET4 1.13 01/05/2022   FREET4 0.73 09/28/2021   FREET4 0.88 04/10/2020   FREET4 1.18 11/12/2019  05/06/2014: (Dr. Lenon, ObGyn): TSH 12.58  At last check, TSI's were undetectable: Lab Results  Component Value Date   TSI <89 05/16/2018    Pt denies: - feeling nodules in neck - hoarseness - dysphagia - choking  She has + FH of thyroid  disorders in: mother. No FH of  thyroid  cancer. No h/o radiation tx to head or neck. No herbal supplements. No recent steroids use.   PCOS: -She had heavy bleeding, after which we started OCPs in 2017, but continued to have increased bleeding so she was switched to NuvaRing, however, bleeding continued so she stopped it.  She was then in a research trial with a form of oral contraceptive.  Her bleeding improved.  The study finished in 08/2019.  -In 2021, she wanted to switch back to oral contraceptives.  Due to previous history of increased vaginal bleeding on these, I directed her back to OB/GYN >> she restarted OCPs before our last visit -norethindrone .  However, since last visit, she stopped them. ObGyn: Dr. Oliva Lenon.  -In 08/2020, she wanted to start back spironolactone .  She finally started this in 2023, but stopped it due to various symptoms including dehydration and near syncopal episodes.  -She was diagnosed with diabetes in 01/2022.  She started metformin , then glipizide , then back on Metformin , but then stopped all diabetic medications with good control.  -In 09/2023, we started back on metformin   DM2:  Currently on: - Metformin  500 mg twice a day with meals -started 09/2023  Reviewed HbA1c levels: Lab Results  Component Value Date   HGBA1C 6.6 (H) 10/10/2023   HGBA1C 6.0 02/14/2023   HGBA1C 6.3 (A) 08/04/2022   HGBA1C 6.3 (A) 05/11/2022  HGBA1C 7.0 (H) 02/05/2022   HGBA1C 6.9 (H) 09/28/2021   HGBA1C 5.9 (A) 09/08/2020   HGBA1C 6.2 04/10/2020   HGBA1C 6.2 (A) 11/12/2019   HGBA1C 6.4 05/17/2019   Prior insurance did not cover nutrition visits when she had prediabetes.  No CKD: Lab Results  Component Value Date   BUN 10 03/14/2024   Lab Results  Component Value Date   CREATININE 0.87 03/14/2024   + HL: Lab Results  Component Value Date   CHOL 228 (H) 03/14/2024   HDL 65.50 03/14/2024   LDLCALC 118 (H) 03/14/2024   TRIG 225.0 (H) 03/14/2024   CHOLHDL 3 03/14/2024  She is not on a  statin.  She had blurry vision - saw ophthalmologist >>  bifocals.  ROS:  + see HPI  I reviewed pt's medications, allergies, PMH, social hx, family hx, and changes were documented in the history of present illness. Otherwise, unchanged from my initial visit note.  Past Medical History:  Diagnosis Date   Anxiety    R/T dx   Asthma    dx 2003=1st asthma attack 10/2021   Diabetes mellitus without complication (HCC)    on meds   HPV in female    Hyperlipidemia    diet controlled/exercise   Migraine    No blood products    Polycystic ovary syndrome    Seasonal allergies    Thyroid  disease    on meds   Past Surgical History:  Procedure Laterality Date   dnc     EXCISION VAGINAL CYST     removal of genital warts   WISDOM TOOTH EXTRACTION     History   Social History   Marital Status: Married    Spouse Name: N/A    Number of Children: 0   Occupational History      Social History Main Topics   Smoking status: Former Smoker   Smokeless tobacco: Not on file   Alcohol Use: Yes   Drug Use: No   Sexual Activity: Yes    Birth Tax adviser: Pill   Current Outpatient Medications on File Prior to Visit  Medication Sig Dispense Refill   ACCU-CHEK GUIDE test strip USE TO TEST BLOOD SUGAR EVERY  MORNING AT NOON AND EVERY NIGHT  AT BEDTIME 100 strip 11   albuterol  (VENTOLIN  HFA) 108 (90 Base) MCG/ACT inhaler Inhale 1-2 puffs into the lungs every 6 (six) hours as needed for wheezing or shortness of breath. 1 each 1   Blood Glucose Monitoring Suppl DEVI 1 each by Does not apply route in the morning, at noon, and at bedtime. May substitute to any manufacturer covered by patient's insurance. Use to check blood sugar TID 1 each 0   buPROPion  (WELLBUTRIN  XL) 300 MG 24 hr tablet TAKE 1 TABLET(300 MG) BY MOUTH DAILY 90 tablet 0   citalopram  (CELEXA ) 10 MG tablet Take 1 tablet (10 mg total) by mouth daily. 90 tablet 0   cyclobenzaprine  (FLEXERIL ) 10 MG tablet Take 1 tablet (10 mg  total) by mouth at bedtime. 15 tablet 0   famotidine  (PEPCID ) 20 MG tablet Take 1 tablet (20 mg total) by mouth 2 (two) times daily. 30 tablet 0   Ferrous Sulfate (IRON PO) Take 1 tablet by mouth daily at 6 (six) AM. Up to 2 tabs depending on blood loss level     finasteride (PROPECIA) 1 MG tablet Take 1 mg by mouth daily.     fluticasone  (FLONASE ) 50 MCG/ACT nasal spray Place 2 sprays into both  nostrils daily. 16 g 0   hydrOXYzine  (ATARAX ) 50 MG tablet TAKE 1 TABLET(50 MG) BY MOUTH THREE TIMES DAILY AS NEEDED 90 tablet 0   levothyroxine  (SYNTHROID ) 125 MCG tablet Take 1 tablet (125 mcg total) by mouth daily before breakfast. 45 tablet 3   metFORMIN  (GLUCOPHAGE ) 500 MG tablet Take 1 tablet (500 mg total) by mouth 2 (two) times daily with a meal. 180 tablet 3   naproxen  (NAPROSYN ) 500 MG tablet TAKE 1 TABLET(500 MG) BY MOUTH TWICE DAILY WITH A MEAL 30 tablet 0   ondansetron  (ZOFRAN ) 4 MG tablet Take 1 tablet (4 mg total) by mouth every 8 (eight) hours as needed for nausea or vomiting. 20 tablet 1   ondansetron  (ZOFRAN -ODT) 4 MG disintegrating tablet 4mg  ODT q4 hours prn nausea/vomit 20 tablet 0   valACYclovir  (VALTREX ) 500 MG tablet TAKE 1 CAPLET BY MOUTH TWICE DAILY AS NEEDED     No current facility-administered medications on file prior to visit.   Allergies  Allergen Reactions   Allegra [Fexofenadine Hcl]     Swelling occurs   Sulfa Antibiotics    Aspirin Rash    Baby asprin; as a child, rash from dyes    Family History  Problem Relation Age of Onset   Hyperlipidemia Mother    Hypertension Mother    Diabetes Mother    Cancer Father        lung tumor/ lung removed   Hypertension Sister    Breast cancer Paternal Aunt    Breast cancer Paternal Grandmother    Colon polyps Neg Hx    Colon cancer Neg Hx    Esophageal cancer Neg Hx    Rectal cancer Neg Hx    Stomach cancer Neg Hx    PE: LMP 02/20/2024 (Approximate)  Wt Readings from Last 10 Encounters:  03/14/24 188 lb (85.3 kg)   10/10/23 188 lb (85.3 kg)  08/30/23 185 lb (83.9 kg)  07/25/23 189 lb (85.7 kg)  04/12/23 188 lb (85.3 kg)  04/11/23 189 lb 6.4 oz (85.9 kg)  03/24/23 186 lb (84.4 kg)  02/08/23 185 lb (83.9 kg)  01/12/23 184 lb (83.5 kg)  01/06/23 187 lb (84.8 kg)   Constitutional: overweight, in NAD Eyes:  EOMI, no exophthalmos ENT: no neck masses, no cervical lymphadenopathy Cardiovascular: RRR, No MRG Respiratory: CTA B Musculoskeletal: no deformities Skin:no rashes Neurological: no tremor with outstretched hands  ASSESSMENT: 1. Postablative Hypothyroidism - after RAI tx for Graves ds.  2.  History of Graves' disease  3. PCOS  4.  DM2 -Diagnosed in 2023 -Latest HbA1c was excellent: Lab Results  Component Value Date   HGBA1C 6.6 (H) 10/10/2023  -She was initially on metformin  (metformin  ER due to nausea), then switched to glipizide  due to low blood sugars but this only exacerbated her glipizide , as expected, so she went back to metformin , but then came off any diabetic medications -Management per PCP  PLAN:  1. Patient with longstanding hypothyroidism, history of incomplete compliance with levothyroxine  - latest thyroid  labs reviewed with pt. >> normal: Lab Results  Component Value Date   TSH 3.63 03/14/2024  - she continues on LT4 125 mcg daily - pt feels good on this dose. - we discussed about taking the thyroid  hormone every day, with water, >30 minutes before breakfast, separated by >4 hours from acid reflux medications, calcium, iron, multivitamins. Pt. is taking it correctly. - will check thyroid  tests today: TSH and fT4 - If labs are abnormal, she will need to  return for repeat TFTs in 1.5 months - OTW, I will see her back in 6 months  2.  History of Graves' disease - Resolved after RAI treatment - TSI's were undetectable at last check: Lab Results  Component Value Date   TSI <89 05/16/2018  -No new neck compression symptoms - she has no active signs of Graves'  ophthalmopathy: No blurry vision, double vision, eye pain, chemosis - She previously described periodic vision changes but these are unlikely to have been related to Graves' disease.  They appeared to be part of aura before syncopal events.  With the last event she lost consciousness completely and lost control of her bladder.  She now sees neurology  3. PCOS - She has a history of heavy menstrual cycles and was on oral contraceptives, then NuvaRing per OB/GYN.  Afterwards, she wanted to switch back to OCPs but due to previous history of increased bleeding, I recommended that she contacted OB/GYN for this. - She was previously on norethindrone  but came off and was interested in starting back on spironolactone .  She did start this, but we held it afterwards due to dehydration and near syncopal episodes. - She is on finasteride - At last visit, she was interested in starting metformin  so we started this at a low dose and increase it slowly.  No orders of the defined types were placed in this encounter.  Lela Fendt, MD PhD [facility] Endocr[facility]

## 2024-04-11 ENCOUNTER — Ambulatory Visit: Admitting: Internal Medicine

## 2024-04-18 ENCOUNTER — Encounter: Payer: Self-pay | Admitting: Internal Medicine

## 2024-04-18 ENCOUNTER — Ambulatory Visit: Admitting: Adult Health

## 2024-04-18 ENCOUNTER — Encounter: Payer: Self-pay | Admitting: Adult Health

## 2024-04-18 ENCOUNTER — Ambulatory Visit: Admitting: Internal Medicine

## 2024-04-18 VITALS — BP 110/60 | HR 84 | Temp 99.1°F | Ht 69.5 in | Wt 192.0 lb

## 2024-04-18 VITALS — BP 122/70 | HR 96 | Ht 69.5 in | Wt 193.6 lb

## 2024-04-18 DIAGNOSIS — E282 Polycystic ovarian syndrome: Secondary | ICD-10-CM

## 2024-04-18 DIAGNOSIS — F419 Anxiety disorder, unspecified: Secondary | ICD-10-CM

## 2024-04-18 DIAGNOSIS — Z8639 Personal history of other endocrine, nutritional and metabolic disease: Secondary | ICD-10-CM | POA: Diagnosis not present

## 2024-04-18 DIAGNOSIS — E890 Postprocedural hypothyroidism: Secondary | ICD-10-CM

## 2024-04-18 DIAGNOSIS — F32A Depression, unspecified: Secondary | ICD-10-CM

## 2024-04-18 NOTE — Patient Instructions (Addendum)
 Please stop at the lab.  Please continue Armour daily  - restart ASAP.  Take the thyroid  hormone every day, with water, at least 30 minutes before breakfast, separated by at least 4 hours from: - acid reflux medications - calcium - iron - multivitamins  Also, continue: -  Metformin  500 mg 2x a day  Look up the portfolio diet.  Please come back for a follow-up appointment in 6 months.

## 2024-04-18 NOTE — Progress Notes (Signed)
 Subjective:    Patient ID: Danielle [name], female    DOB: [birth year], 48 y.o.   MRN: [id number]  HPI 48 year old female who  has a past medical history of Anxiety, Asthma, Diabetes mellitus without complication (HCC), HPV in female, Hyperlipidemia, Migraine, No blood products, Polycystic ovary syndrome, Seasonal allergies, and Thyroid  disease.  She presents to the office today for one month follow up regarding anxiety and depression.  During her last visit she reported that Wellbutrin  300 mg XR was not working as well as she hoped to regards to anxiety and depression.  She reported feeling on edge most of the time anxious doing things that she wants enjoyed.  We kept her on Wellbutrin  300 mg extended release and added Celexa  10 mg daily.  Today she reports that since starting Celexa  she has had sexual side effects of unable to climax and at the beginning had issues with sleep. Overall she is feeling better though and is exercising more.       04/18/2024    1:26 PM 03/14/2024    8:46 AM 02/08/2023    5:03 PM  PHQ9 SCORE ONLY  PHQ-9 Total Score 6 11 0      Data saved with a previous flowsheet row definition      04/18/2024    1:27 PM 03/14/2024    8:46 AM 08/04/2022    3:19 PM 03/04/2020    3:34 PM  GAD 7 : Generalized Anxiety Score  Nervous, Anxious, on Edge 3 3 2 3   Control/stop worrying 2 3 2 3   Worry too much - different things 1 3 1 3   Trouble relaxing 1 2 1 3   Restless 0 3 1 2   Easily annoyed or irritable 1 3 1 3   Afraid - awful might happen 1 3 1 3   Total GAD 7 Score 9 20 9 20   Anxiety Difficulty Not difficult at all Very difficult Not difficult at all Not difficult at all     Review of Systems See HPI   Past Medical History:  Diagnosis Date   Anxiety    R/T dx   Asthma    dx 2003=1st asthma attack 10/2021   Diabetes mellitus without complication (HCC)    on meds   HPV in female    Hyperlipidemia    diet controlled/exercise   Migraine    No blood  products    Polycystic ovary syndrome    Seasonal allergies    Thyroid  disease    on meds    Social History   Socioeconomic History   Marital status: Single    Spouse name: Not on file   Number of children: Not on file   Years of education: Not on file   Highest education level: Master's degree (e.g., MA, MS, MEng, MEd, MSW, MBA)  Occupational History   Not on file  Tobacco Use   Smoking status: Former    Current packs/day: 0.00    Types: Cigarettes    Quit date: 01/30/2022    Years since quitting: 2.2   Smokeless tobacco: Never  Vaping Use   Vaping status: Never Used  Substance and Sexual Activity   Alcohol use: Not Currently    Alcohol/week: 4.0 - 6.0 standard drinks of alcohol    Types: 2 - 3 Glasses of wine, 2 - 3 Shots of liquor per week    Comment: OCCAS   Drug use: No   Sexual activity: Yes    Birth control/protection: Pill  Other Topics Concern   Not on file  Social History Narrative   Are you right handed or left handed? RIGHT   Are you currently employed ?    What is your current occupation?    Do you live at home alone?no   Who lives with you? mother   What type of home do you live in: 1 story or 2 story? two   Caffeine occas   Social Drivers of Health   Financial Resource Strain: Low Risk  (02/04/2023)   Overall Financial Resource Strain (CARDIA)    Difficulty of Paying Living Expenses: Not very hard  Food Insecurity: No Food Insecurity (02/04/2023)   Hunger Vital Sign    Worried About Running Out of Food in the Last Year: Never true    Ran Out of Food in the Last Year: Never true  Transportation Needs: No Transportation Needs (02/04/2023)   PRAPARE - Administrator, Civil Service (Medical): No    Lack of Transportation (Non-Medical): No  Physical Activity: Insufficiently Active (02/04/2023)   Exercise Vital Sign    Days of Exercise per Week: 3 days    Minutes of Exercise per Session: 30 min  Stress: Stress Concern Present (02/04/2023)    [employer] of Occupational Health - Occupational Stress Questionnaire    Feeling of Stress : To some extent  Social Connections: Socially Isolated (02/04/2023)   Social Connection and Isolation Panel    Frequency of Communication with Friends and Family: More than three times a week    Frequency of Social Gatherings with Friends and Family: Twice a week    Attends Religious Services: Never    Database administrator or Organizations: No    Attends Engineer, structural: Not on file    Marital Status: Divorced  Intimate Partner Violence: Not At Risk (09/19/2022)   Received from [facility]   HITS    Over the last 12 months how often did your partner physically hurt you?: Never    Over the last 12 months how often did your partner insult you or talk down to you?: Never    Over the last 12 months how often did your partner threaten you with physical harm?: Never    Over the last 12 months how often did your partner scream or curse at you?: Never    Past Surgical History:  Procedure Laterality Date   dnc     EXCISION VAGINAL CYST     removal of genital warts   WISDOM TOOTH EXTRACTION      Family History  Problem Relation Age of Onset   Hyperlipidemia Mother    Hypertension Mother    Diabetes Mother    Cancer Father        lung tumor/ lung removed   Hypertension Sister    Breast cancer Paternal Aunt    Breast cancer Paternal Grandmother    Colon polyps Neg Hx    Colon cancer Neg Hx    Esophageal cancer Neg Hx    Rectal cancer Neg Hx    Stomach cancer Neg Hx     Allergies  Allergen Reactions   Allegra [Fexofenadine Hcl]     Swelling occurs   Sulfa Antibiotics    Aspirin Rash    Baby asprin; as a child, rash from dyes     Current Outpatient Medications on File Prior to Visit  Medication Sig Dispense Refill   ACCU-CHEK GUIDE test strip USE TO TEST BLOOD SUGAR EVERY  MORNING AT NOON AND EVERY NIGHT  AT BEDTIME 100 strip 11   albuterol  (VENTOLIN  HFA) 108 (90  Base) MCG/ACT inhaler Inhale 1-2 puffs into the lungs every 6 (six) hours as needed for wheezing or shortness of breath. 1 each 1   Blood Glucose Monitoring Suppl DEVI 1 each by Does not apply route in the morning, at noon, and at bedtime. May substitute to any manufacturer covered by patient's insurance. Use to check blood sugar TID 1 each 0   buPROPion  (WELLBUTRIN  XL) 300 MG 24 hr tablet TAKE 1 TABLET(300 MG) BY MOUTH DAILY 90 tablet 0   citalopram  (CELEXA ) 10 MG tablet Take 1 tablet (10 mg total) by mouth daily. 90 tablet 0   cyclobenzaprine  (FLEXERIL ) 10 MG tablet Take 1 tablet (10 mg total) by mouth at bedtime. 15 tablet 0   famotidine  (PEPCID ) 20 MG tablet Take 1 tablet (20 mg total) by mouth 2 (two) times daily. 30 tablet 0   Ferrous Sulfate (IRON PO) Take 1 tablet by mouth daily at 6 (six) AM. Up to 2 tabs depending on blood loss level     finasteride (PROPECIA) 1 MG tablet Take 1 mg by mouth daily.     fluticasone  (FLONASE ) 50 MCG/ACT nasal spray Place 2 sprays into both nostrils daily. 16 g 0   hydrOXYzine  (ATARAX ) 50 MG tablet TAKE 1 TABLET(50 MG) BY MOUTH THREE TIMES DAILY AS NEEDED 90 tablet 0   levothyroxine  (SYNTHROID ) 125 MCG tablet Take 1 tablet (125 mcg total) by mouth daily before breakfast. 45 tablet 3   metFORMIN  (GLUCOPHAGE ) 500 MG tablet Take 1 tablet (500 mg total) by mouth 2 (two) times daily with a meal. 180 tablet 3   naproxen  (NAPROSYN ) 500 MG tablet TAKE 1 TABLET(500 MG) BY MOUTH TWICE DAILY WITH A MEAL 30 tablet 0   ondansetron  (ZOFRAN ) 4 MG tablet Take 1 tablet (4 mg total) by mouth every 8 (eight) hours as needed for nausea or vomiting. 20 tablet 1   ondansetron  (ZOFRAN -ODT) 4 MG disintegrating tablet 4mg  ODT q4 hours prn nausea/vomit 20 tablet 0   valACYclovir  (VALTREX ) 500 MG tablet TAKE 1 CAPLET BY MOUTH TWICE DAILY AS NEEDED     No current facility-administered medications on file prior to visit.    BP 110/60   Pulse 84   Temp 99.1 F (37.3 C) (Oral)   Ht  5' 9.5 (1.765 m)   Wt 192 lb (87.1 kg)   LMP 03/22/2024 (Approximate)   SpO2 98%   BMI 27.95 kg/m       Objective:   Physical Exam Vitals and nursing note reviewed.  Constitutional:      Appearance: Normal appearance.  Cardiovascular:     Rate and Rhythm: Normal rate and regular rhythm.     Pulses: Normal pulses.     Heart sounds: Normal heart sounds.  Pulmonary:     Effort: Pulmonary effort is normal.     Breath sounds: Normal breath sounds.  Skin:    General: Skin is dry.  Neurological:     General: No focal deficit present.     Mental Status: She is alert and oriented to person, place, and time.  Psychiatric:        Mood and Affect: Mood normal.        Thought Content: Thought content normal.        Judgment: Judgment normal.       Assessment & Plan:  1. Anxiety and depression (Primary) - She has had  improvement and is feeling better. We did discuss coming off Celexa  or switching her to something else. She would like to try taking 5 mg daily before deciding. I am ok with this and she will keep me informed.   Rosamaria Donn, NP

## 2024-04-18 NOTE — Progress Notes (Unsigned)
 Patient ID: Danielle [name], female   DOB: 03/29/1976, 48 y.o.   MRN: [id number] This note was precharted 04/11/2024.  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  She also has diabetes type 2, which is managed by PCP.  Last visit 6 months ago.  Interim history: She was exercising consistently 5-6 days a week >> not now but plans to restart.  No increased urination, nausea, chest pain. She has constipation - on Metamucil. She is part of a thyroid  study - on Armour ? Dose  - now off for 3 days as she lost the bottle. She had 2 wisdom teeth extracted recently.  No significant pain.  Hypothyroidism: She has a history of medication noncompliance with levothyroxine . In 04/2018, she was missing levothyroxine  doses and her TSH was high (at that time she had increased stress as she separated from her husband).  She improved compliance afterwards.  Pt was on levothyroxine  125 mcg daily, but currently on Armour, as mentioned above.  It is unclear which dose that she is using. - in am - fasting - at least 30 min from b'fast - no Ca, PPIs, + occasional multivitamins >4h later - off iron now - stopped Biotin (Hair Skin and Nails), now B complex with 3000 mcg of biotin  Reviewed her TFTs: Lab Results  Component Value Date   TSH 3.63 03/14/2024   TSH 1.13 10/10/2023   TSH 6.62 (H) 05/25/2023   TSH 5.24 12/20/2022   TSH 0.20 (L) 11/01/2022   TSH 0.04 (L) 09/22/2022   TSH 0.05 (L) 09/22/2022   TSH 0.15 (L) 04/02/2022   TSH 9.16 (H) 02/05/2022   TSH 2.18 01/05/2022   FREET4 1.5 10/10/2023   FREET4 1.0 05/25/2023   FREET4 0.82 12/20/2022   FREET4 1.35 11/01/2022   FREET4 1.40 09/22/2022   FREET4 1.30 04/02/2022   FREET4 1.13 01/05/2022   FREET4 0.73 09/28/2021   FREET4 0.88 04/10/2020   FREET4 1.18 11/12/2019  05/06/2014: (Dr. Lenon, ObGyn): TSH 12.58  At last  check, TSI's were undetectable: Lab Results  Component Value Date   TSI <89 05/16/2018    Pt denies: - feeling nodules in neck - hoarseness - dysphagia - choking  She has + FH of thyroid  disorders in: mother. No FH of thyroid  cancer. No h/o radiation tx to head or neck. No herbal supplements. No recent steroids use.   PCOS: -She had heavy bleeding, after which we started OCPs in 2017, but continued to have increased bleeding so she was switched to NuvaRing, however, bleeding continued so she stopped it.  She was then in a research trial with a form of oral contraceptive.  Her bleeding improved.  The study finished in 08/2019.  -In 2021, she wanted to switch back to oral contraceptives.  Due to previous history of increased vaginal bleeding on these, I directed her back to OB/GYN >> she restarted OCPs before our last visit -norethindrone .  However, since last visit, she stopped them. ObGyn: Dr. Oliva Lenon.  -In 08/2020, she wanted to start back spironolactone .  She finally started this in 2023, but stopped it due to various symptoms including dehydration and near syncopal episodes.  -She was diagnosed with diabetes in 01/2022.  She started metformin , then glipizide , then back on Metformin , but then stopped all diabetic medications with good control.  -In 09/2023, we started back on metformin   DM2:  Currently on: - Metformin  500 mg twice a day with meals -started 09/2023 >> not consistently  She checks sugars inconsistently: - am: 107-125, 140  Reviewed HbA1c levels: Lab Results  Component Value Date   HGBA1C 6.6 (H) 10/10/2023   HGBA1C 6.0 02/14/2023   HGBA1C 6.3 (A) 08/04/2022   HGBA1C 6.3 (A) 05/11/2022   HGBA1C 7.0 (H) 02/05/2022   HGBA1C 6.9 (H) 09/28/2021   HGBA1C 5.9 (A) 09/08/2020   HGBA1C 6.2 04/10/2020   HGBA1C 6.2 (A) 11/12/2019   HGBA1C 6.4 05/17/2019   Prior insurance did not cover nutrition visits when she had prediabetes.  No CKD: Lab Results   Component Value Date   BUN 10 03/14/2024   Lab Results  Component Value Date   CREATININE 0.87 03/14/2024   No results found for: MICRALBCREAT  + HL: Lab Results  Component Value Date   CHOL 228 (H) 03/14/2024   HDL 65.50 03/14/2024   LDLCALC 118 (H) 03/14/2024   TRIG 225.0 (H) 03/14/2024   CHOLHDL 3 03/14/2024  She is not on a statin.  She had blurry vision - saw ophthalmologist >>  bifocals. Last eye exam: No DR - reportedly.  Last foot exam: 02/2024.  ROS:  + see HPI  I reviewed pt's medications, allergies, PMH, social hx, family hx, and changes were documented in the history of present illness. Otherwise, unchanged from my initial visit note.  Past Medical History:  Diagnosis Date   Anxiety    R/T dx   Asthma    dx 2003=1st asthma attack 10/2021   Diabetes mellitus without complication (HCC)    on meds   HPV in female    Hyperlipidemia    diet controlled/exercise   Migraine    No blood products    Polycystic ovary syndrome    Seasonal allergies    Thyroid  disease    on meds   Past Surgical History:  Procedure Laterality Date   dnc     EXCISION VAGINAL CYST     removal of genital warts   WISDOM TOOTH EXTRACTION     History   Social History   Marital Status: Married    Spouse Name: N/A    Number of Children: 0   Occupational History      Social History Main Topics   Smoking status: Former Smoker   Smokeless tobacco: Not on file   Alcohol Use: Yes   Drug Use: No   Sexual Activity: Yes    Birth Tax adviser: Pill   Current Outpatient Medications on File Prior to Visit  Medication Sig Dispense Refill   ACCU-CHEK GUIDE test strip USE TO TEST BLOOD SUGAR EVERY  MORNING AT NOON AND EVERY NIGHT  AT BEDTIME 100 strip 11   albuterol  (VENTOLIN  HFA) 108 (90 Base) MCG/ACT inhaler Inhale 1-2 puffs into the lungs every 6 (six) hours as needed for wheezing or shortness of breath. 1 each 1   Blood Glucose Monitoring Suppl DEVI 1 each by Does not  apply route in the morning, at noon, and at bedtime. May substitute to any manufacturer covered by patient's insurance. Use to check blood sugar TID 1 each 0   buPROPion  (WELLBUTRIN  XL) 300 MG 24 hr tablet TAKE 1 TABLET(300 MG) BY MOUTH DAILY 90 tablet 0   citalopram  (CELEXA ) 10 MG tablet Take 1 tablet (10 mg total) by mouth daily. 90 tablet 0   cyclobenzaprine  (FLEXERIL ) 10 MG tablet Take 1 tablet (10 mg total) by mouth at bedtime. 15 tablet  0   famotidine  (PEPCID ) 20 MG tablet Take 1 tablet (20 mg total) by mouth 2 (two) times daily. 30 tablet 0   Ferrous Sulfate (IRON PO) Take 1 tablet by mouth daily at 6 (six) AM. Up to 2 tabs depending on blood loss level     finasteride (PROPECIA) 1 MG tablet Take 1 mg by mouth daily.     fluticasone  (FLONASE ) 50 MCG/ACT nasal spray Place 2 sprays into both nostrils daily. 16 g 0   hydrOXYzine  (ATARAX ) 50 MG tablet TAKE 1 TABLET(50 MG) BY MOUTH THREE TIMES DAILY AS NEEDED 90 tablet 0   levothyroxine  (SYNTHROID ) 125 MCG tablet Take 1 tablet (125 mcg total) by mouth daily before breakfast. 45 tablet 3   metFORMIN  (GLUCOPHAGE ) 500 MG tablet Take 1 tablet (500 mg total) by mouth 2 (two) times daily with a meal. 180 tablet 3   naproxen  (NAPROSYN ) 500 MG tablet TAKE 1 TABLET(500 MG) BY MOUTH TWICE DAILY WITH A MEAL 30 tablet 0   ondansetron  (ZOFRAN ) 4 MG tablet Take 1 tablet (4 mg total) by mouth every 8 (eight) hours as needed for nausea or vomiting. 20 tablet 1   ondansetron  (ZOFRAN -ODT) 4 MG disintegrating tablet 4mg  ODT q4 hours prn nausea/vomit 20 tablet 0   valACYclovir  (VALTREX ) 500 MG tablet TAKE 1 CAPLET BY MOUTH TWICE DAILY AS NEEDED     No current facility-administered medications on file prior to visit.   Allergies  Allergen Reactions   Allegra [Fexofenadine Hcl]     Swelling occurs   Sulfa Antibiotics    Aspirin Rash    Baby asprin; as a child, rash from dyes    Family History  Problem Relation Age of Onset   Hyperlipidemia Mother     Hypertension Mother    Diabetes Mother    Cancer Father        lung tumor/ lung removed   Hypertension Sister    Breast cancer Paternal Aunt    Breast cancer Paternal Grandmother    Colon polyps Neg Hx    Colon cancer Neg Hx    Esophageal cancer Neg Hx    Rectal cancer Neg Hx    Stomach cancer Neg Hx    PE: BP 122/70   Pulse 96   Ht 5' 9.5 (1.765 m)   Wt 193 lb 9.6 oz (87.8 kg)   SpO2 97%   BMI 28.18 kg/m  Wt Readings from Last 10 Encounters:  04/18/24 193 lb 9.6 oz (87.8 kg)  03/14/24 188 lb (85.3 kg)  10/10/23 188 lb (85.3 kg)  08/30/23 185 lb (83.9 kg)  07/25/23 189 lb (85.7 kg)  04/12/23 188 lb (85.3 kg)  04/11/23 189 lb 6.4 oz (85.9 kg)  03/24/23 186 lb (84.4 kg)  02/08/23 185 lb (83.9 kg)  01/12/23 184 lb (83.5 kg)   Constitutional: overweight, in NAD Eyes:  EOMI, no exophthalmos ENT: no neck masses, no cervical lymphadenopathy Cardiovascular: Cardia, RR, No MRG Respiratory: CTA B Musculoskeletal: no deformities Skin:no rashes Neurological: no tremor with outstretched hands  ASSESSMENT: 1. Postablative Hypothyroidism - after RAI tx for Graves ds.  2.  History of Graves' disease  3. PCOS  4.  DM2 -Diagnosed in 2023 -She was initially on metformin  (metformin  ER due to nausea), switching to glipizide  but then went back to metformin .at last visit, however, she was off antidiabetic medications  - in 09/2023, per her request, we started back on metformin , which she continues today, but not taking it consistently.  PLAN:  1. Patient with longstanding hypothyroidism, history of incomplete compliance with levothyroxine  - latest thyroid  labs reviewed with pt. >> normal: Lab Results  Component Value Date   TSH 3.63 03/14/2024  - she was previously on LT4 125 mcg daily, but currently on Armour, as mentioned above, as part of a study.  She ran out of her Armour 3 days ago and I advised her to immediately let the study team know so they can refill this for her -  pt feels good on this dose, but maybe more tired - we discussed about taking the thyroid  hormone every day, with water, >30 minutes before breakfast, separated by >4 hours from acid reflux medications, calcium, iron, multivitamins. Pt. is taking it correctly.  2.  History of Graves' disease - Resolved after RAI treatment - TSI antibodies were undetectable at last check Lab Results  Component Value Date   TSI <89 05/16/2018  - No neck compression symptoms - she has no active signs of Graves' ophthalmopathy: No blurry vision, double vision, eye pain, chemosis - She previously described periodic vision changes but these are unlikely to have been related to Graves' disease.  They appeared to be part of aura before syncopal events.  With the last event she lost consciousness completely and lost control of her bladder.  She now sees neurology.  3. PCOS - She has a history of heavy menstrual cycles and was on oral contraceptives, then NuvaRing per OB/GYN.  Afterwards, she wanted to switch back to OCPs, but due to previous history of increased bleeding, I recommended that she contacted OB/GYN for this - Previously on norethindrone  but came off and was interested in starting back on spironolactone .  Did not start this but we held it afterwards due to dehydration and near syncopal episodes. - she is on finasteride for hair loss - Last visit, she was interested in starting metformin  so we started this at a low dose and increased it slowly.  Currently taking 500 mg twice a day, but not quite every day.  HbA1c today is lower, at 6.0%.  Lela Fendt, MD PhD [facility] Endocrinology

## 2024-05-14 ENCOUNTER — Other Ambulatory Visit: Payer: Self-pay | Admitting: Adult Health

## 2024-05-14 DIAGNOSIS — F419 Anxiety disorder, unspecified: Secondary | ICD-10-CM

## 2024-06-12 ENCOUNTER — Other Ambulatory Visit: Payer: Self-pay | Admitting: Adult Health

## 2024-06-12 DIAGNOSIS — F419 Anxiety disorder, unspecified: Secondary | ICD-10-CM

## 2024-06-14 ENCOUNTER — Encounter: Payer: Self-pay | Admitting: Adult Health

## 2024-06-27 NOTE — Telephone Encounter (Signed)
 Called pt left detailed message for pt to call back to schedule appt.

## 2024-07-04 ENCOUNTER — Ambulatory Visit: Admitting: Adult Health

## 2024-07-04 ENCOUNTER — Encounter: Payer: Self-pay | Admitting: Adult Health

## 2024-07-04 VITALS — BP 120/88 | HR 98 | Temp 98.6°F | Ht 69.5 in | Wt 188.0 lb

## 2024-07-04 DIAGNOSIS — F411 Generalized anxiety disorder: Secondary | ICD-10-CM | POA: Diagnosis not present

## 2024-07-04 DIAGNOSIS — F331 Major depressive disorder, recurrent, moderate: Secondary | ICD-10-CM | POA: Diagnosis not present

## 2024-07-04 DIAGNOSIS — F1090 Alcohol use, unspecified, uncomplicated: Secondary | ICD-10-CM

## 2024-07-04 NOTE — Progress Notes (Signed)
 "  Subjective:    Patient ID: Danielle [name], female    DOB: 22-Nov-1975, 49 y.o.   MRN: [id number]  HPI Discussed the use of AI scribe software for clinical note transcription with the patient, who gave verbal consent to proceed.  History of Present Illness   Danielle [name] is a 49 year old female who presents with mental health concerns and medication side effects.  She reports worsening mood and stress related to multiple recent family deaths, job stress, and caregiving responsibilities for her mother, whose health is declining and who lives with her. She feels overwhelmed and describes her mental health as not great.  She takes Celexa  5 mg, reduced from a higher dose due to sexual dysfunction, but feels her mental health symptoms persist. She uses hydroxyzine  at night for sleep but has next-day sedation that interferes with her early morning work shifts.  She is worried about job security and finds a recent change from chat-based to phone-based duties exhausting.  She reports increased alcohol use that worsens her mood and behavior. She has contacted her Pension Scheme Manager for support.  She attends personal training three times a week, which helps her mood. She continues to provide daily care for her diabetic mother and is frustrated by her mother's poor diet and food hoarding, which adds to her stress. She notes having a new supportive relationship as a positive factor.        Review of Systems See HPI   Past Medical History:  Diagnosis Date   Anxiety    R/T dx   Asthma    dx 2003=1st asthma attack 10/2021   Diabetes mellitus without complication (HCC)    on meds   HPV in female    Hyperlipidemia    diet controlled/exercise   Migraine    No blood products    Polycystic ovary syndrome    Seasonal allergies    Thyroid  disease    on meds    Social History   Socioeconomic History   Marital status: Single    Spouse name: Not on file    Number of children: Not on file   Years of education: Not on file   Highest education level: Master's degree (e.g., MA, MS, MEng, MEd, MSW, MBA)  Occupational History   Not on file  Tobacco Use   Smoking status: Former    Current packs/day: 0.00    Types: Cigarettes    Quit date: 01/30/2022    Years since quitting: 2.4   Smokeless tobacco: Never  Vaping Use   Vaping status: Never Used  Substance and Sexual Activity   Alcohol use: Not Currently    Alcohol/week: 4.0 - 6.0 standard drinks of alcohol    Types: 2 - 3 Glasses of wine, 2 - 3 Shots of liquor per week    Comment: OCCAS   Drug use: No   Sexual activity: Yes    Birth control/protection: Pill  Other Topics Concern   Not on file  Social History Narrative   Are you right handed or left handed? RIGHT   Are you currently employed ?    What is your current occupation?    Do you live at home alone?no   Who lives with you? mother   What type of home do you live in: 1 story or 2 story? two   Caffeine occas   Social Drivers of Health   Tobacco Use: Medium Risk (07/04/2024)   Patient History  Smoking Tobacco Use: Former    Smokeless Tobacco Use: Never    Passive Exposure: Not on Actuary Strain: Low Risk (02/04/2023)   Overall Financial Resource Strain (CARDIA)    Difficulty of Paying Living Expenses: Not very hard  Food Insecurity: No Food Insecurity (02/04/2023)   Hunger Vital Sign    Worried About Running Out of Food in the Last Year: Never true    Ran Out of Food in the Last Year: Never true  Transportation Needs: No Transportation Needs (02/04/2023)   PRAPARE - Administrator, Civil Service (Medical): No    Lack of Transportation (Non-Medical): No  Physical Activity: Insufficiently Active (02/04/2023)   Exercise Vital Sign    Days of Exercise per Week: 3 days    Minutes of Exercise per Session: 30 min  Stress: Stress Concern Present (02/04/2023)   [employer] of Occupational Health -  Occupational Stress Questionnaire    Feeling of Stress : To some extent  Social Connections: Socially Isolated (02/04/2023)   Social Connection and Isolation Panel    Frequency of Communication with Friends and Family: More than three times a week    Frequency of Social Gatherings with Friends and Family: Twice a week    Attends Religious Services: Never    Database Administrator or Organizations: No    Attends Engineer, Structural: Not on file    Marital Status: Divorced  Intimate Partner Violence: Not At Risk (09/19/2022)   Received from [facility]   HITS    Over the last 12 months how often did your partner physically hurt you?: Never    Over the last 12 months how often did your partner insult you or talk down to you?: Never    Over the last 12 months how often did your partner threaten you with physical harm?: Never    Over the last 12 months how often did your partner scream or curse at you?: Never  Depression (PHQ2-9): Medium Risk (04/18/2024)   Depression (PHQ2-9)    PHQ-2 Score: 6  Alcohol Screen: Low Risk (02/04/2023)   Alcohol Screen    Last Alcohol Screening Score (AUDIT): 2  Housing: Low Risk (02/04/2023)   Housing    Last Housing Risk Score: 0  Utilities: Patient Declined (09/19/2022)   Received from [facility] Utilities    Threatened with loss of utilities: Patient declined  Health Literacy: Not on file    Past Surgical History:  Procedure Laterality Date   dnc     EXCISION VAGINAL CYST     removal of genital warts   WISDOM TOOTH EXTRACTION      Family History  Problem Relation Age of Onset   Hyperlipidemia Mother    Hypertension Mother    Diabetes Mother    Cancer Father        lung tumor/ lung removed   Hypertension Sister    Breast cancer Paternal Aunt    Breast cancer Paternal Grandmother    Colon polyps Neg Hx    Colon cancer Neg Hx    Esophageal cancer Neg Hx    Rectal cancer Neg Hx    Stomach cancer Neg Hx      Allergies[1]  Medications Ordered Prior to Encounter[2]  BP 120/88   Pulse 98   Temp 98.6 F (37 C) (Oral)   Ht 5' 9.5 (1.765 m)   Wt 188 lb (85.3 kg)   SpO2 98%   BMI  27.36 kg/m       Objective:   Physical Exam Vitals and nursing note reviewed.  Constitutional:      Appearance: Normal appearance.  Skin:    General: Skin is dry.     Capillary Refill: Capillary refill takes less than 2 seconds.  Neurological:     General: No focal deficit present.     Mental Status: She is alert and oriented to person, place, and time.  Psychiatric:        Mood and Affect: Mood normal. Affect is tearful.        Thought Content: Thought content normal.        Judgment: Judgment normal.        Assessment & Plan:  Assessment and Plan Assessment & Plan Assessment and Plan    Major depressive disorder and generalized anxiety disorder Persistent symptoms despite Celexa  and hydroxyzine . Previous Wellbutrin  trials lost efficacy. Current treatment insufficient. - Referred to psychiatry for further evaluation and management. - Complete ADA and FMLA paperwork for work accommodations- she will send me the paperwork.   Alcohol use disorder Increased alcohol consumption affecting mood and relationships. Acknowledged depressive effects of alcohol. - Encouraged engagement with support services for alcohol use disorder. - Continue personal training and lifestyle modifications.        Darleene Shape, NP  I personally spent a total of 31 minutes in the care of the patient today including preparing to see the patient, getting/reviewing separately obtained history, performing a medically appropriate exam/evaluation, counseling and educating, placing orders, documenting clinical information in the EHR, and listening .       [1]  Allergies Allergen Reactions   Allegra [Fexofenadine Hcl]     Swelling occurs   Sulfa Antibiotics    Aspirin Rash    Baby asprin; as a child, rash from dyes    [2]  Current Outpatient Medications on File Prior to Visit  Medication Sig Dispense Refill   ACCU-CHEK GUIDE test strip USE TO TEST BLOOD SUGAR EVERY  MORNING AT NOON AND EVERY NIGHT  AT BEDTIME 100 strip 11   albuterol  (VENTOLIN  HFA) 108 (90 Base) MCG/ACT inhaler Inhale 1-2 puffs into the lungs every 6 (six) hours as needed for wheezing or shortness of breath. 1 each 1   Blood Glucose Monitoring Suppl DEVI 1 each by Does not apply route in the morning, at noon, and at bedtime. May substitute to any manufacturer covered by patient's insurance. Use to check blood sugar TID 1 each 0   buPROPion  (WELLBUTRIN  XL) 300 MG 24 hr tablet TAKE 1 TABLET BY MOUTH DAILY 90 tablet 3   citalopram  (CELEXA ) 10 MG tablet TAKE 1 TABLET(10 MG) BY MOUTH DAILY 90 tablet 0   cyclobenzaprine  (FLEXERIL ) 10 MG tablet Take 1 tablet (10 mg total) by mouth at bedtime. 15 tablet 0   famotidine  (PEPCID ) 20 MG tablet Take 1 tablet (20 mg total) by mouth 2 (two) times daily. 30 tablet 0   Ferrous Sulfate (IRON PO) Take 1 tablet by mouth daily at 6 (six) AM. Up to 2 tabs depending on blood loss level     finasteride (PROPECIA) 1 MG tablet Take 1 mg by mouth daily.     fluticasone  (FLONASE ) 50 MCG/ACT nasal spray Place 2 sprays into both nostrils daily. 16 g 0   hydrOXYzine  (ATARAX ) 50 MG tablet TAKE 1 TABLET(50 MG) BY MOUTH THREE TIMES DAILY AS NEEDED 90 tablet 0   levothyroxine  (SYNTHROID ) 125 MCG tablet Take 1 tablet (125 mcg  total) by mouth daily before breakfast. 45 tablet 3   metFORMIN  (GLUCOPHAGE ) 500 MG tablet Take 1 tablet (500 mg total) by mouth 2 (two) times daily with a meal. 180 tablet 3   naproxen  (NAPROSYN ) 500 MG tablet TAKE 1 TABLET(500 MG) BY MOUTH TWICE DAILY WITH A MEAL 30 tablet 0   ondansetron  (ZOFRAN ) 4 MG tablet Take 1 tablet (4 mg total) by mouth every 8 (eight) hours as needed for nausea or vomiting. 20 tablet 1   ondansetron  (ZOFRAN -ODT) 4 MG disintegrating tablet 4mg  ODT q4 hours prn nausea/vomit 20  tablet 0   valACYclovir  (VALTREX ) 500 MG tablet TAKE 1 CAPLET BY MOUTH TWICE DAILY AS NEEDED     No current facility-administered medications on file prior to visit.   "

## 2024-07-05 ENCOUNTER — Encounter: Payer: Self-pay | Admitting: Adult Health

## 2024-07-10 ENCOUNTER — Telehealth: Admitting: Adult Health

## 2024-07-10 DIAGNOSIS — E890 Postprocedural hypothyroidism: Secondary | ICD-10-CM | POA: Diagnosis not present

## 2024-07-10 DIAGNOSIS — F411 Generalized anxiety disorder: Secondary | ICD-10-CM

## 2024-07-10 DIAGNOSIS — F331 Major depressive disorder, recurrent, moderate: Secondary | ICD-10-CM

## 2024-07-10 NOTE — Progress Notes (Signed)
 Virtual Visit via Video Note  I connected with Danielle [name] on 07/10/2024 at  3:30 PM EST by a video enabled telemedicine application and verified that I am speaking with the correct person using two identifiers.  Location patient: home Location provider:work or home office Persons participating in the virtual visit: patient, provider  I discussed the limitations of evaluation and management by telemedicine and the availability of in person appointments. The patient expressed understanding and agreed to proceed.   HPI:  49 year old female who  has a past medical history of Anxiety, Asthma, Diabetes mellitus without complication (HCC), HPV in female, Hyperlipidemia, Migraine, No blood products, Polycystic ovary syndrome, Seasonal allergies, and Thyroid  disease.  She has a history of anxiety with major depressive disorder as well as postablative hypothyroidism. She needs FMLA and ADA paperwork filled out.   ROS: See pertinent positives and negatives per HPI.  Past Medical History:  Diagnosis Date   Anxiety    R/T dx   Asthma    dx 2003=1st asthma attack 10/2021   Diabetes mellitus without complication (HCC)    on meds   HPV in female    Hyperlipidemia    diet controlled/exercise   Migraine    No blood products    Polycystic ovary syndrome    Seasonal allergies    Thyroid  disease    on meds    Past Surgical History:  Procedure Laterality Date   dnc     EXCISION VAGINAL CYST     removal of genital warts   WISDOM TOOTH EXTRACTION      Family History  Problem Relation Age of Onset   Hyperlipidemia Mother    Hypertension Mother    Diabetes Mother    Cancer Father        lung tumor/ lung removed   Hypertension Sister    Breast cancer Paternal Aunt    Breast cancer Paternal Grandmother    Colon polyps Neg Hx    Colon cancer Neg Hx    Esophageal cancer Neg Hx    Rectal cancer Neg Hx    Stomach cancer Neg Hx       Current Medications[1]  EXAM:  VITALS per  patient if applicable:  GENERAL: alert, oriented, appears well and in no acute distress  HEENT: atraumatic, conjunttiva clear, no obvious abnormalities on inspection of external nose and ears  NECK: normal movements of the head and neck  LUNGS: on inspection no signs of respiratory distress, breathing rate appears normal, no obvious gross SOB, gasping or wheezing  CV: no obvious cyanosis  MS: moves all visible extremities without noticeable abnormality  PSYCH/NEURO: pleasant and cooperative, no obvious depression or anxiety, speech and thought processing grossly intact  ASSESSMENT AND PLAN:  Discussed the following assessment and plan:  1. Generalized anxiety disorder (Primary) - Reviewed FMLA and ADA paperwork with the patient and it was filled out  - Paperwork faxed   2. Moderate episode of recurrent major depressive disorder (HCC)   3. Postablative hypothyroidism       I discussed the assessment and treatment plan with the patient. The patient was provided an opportunity to ask questions and all were answered. The patient agreed with the plan and demonstrated an understanding of the instructions.   The patient was advised to call back or seek an in-person evaluation if the symptoms worsen or if the condition fails to improve as anticipated.   Elim Economou, NP     [1]  Current Outpatient Medications:  ACCU-CHEK GUIDE test strip, USE TO TEST BLOOD SUGAR EVERY  MORNING AT NOON AND EVERY NIGHT  AT BEDTIME, Disp: 100 strip, Rfl: 11   albuterol  (VENTOLIN  HFA) 108 (90 Base) MCG/ACT inhaler, Inhale 1-2 puffs into the lungs every 6 (six) hours as needed for wheezing or shortness of breath., Disp: 1 each, Rfl: 1   Blood Glucose Monitoring Suppl DEVI, 1 each by Does not apply route in the morning, at noon, and at bedtime. May substitute to any manufacturer covered by patient's insurance. Use to check blood sugar TID, Disp: 1 each, Rfl: 0   buPROPion  (WELLBUTRIN  XL) 300 MG 24  hr tablet, TAKE 1 TABLET BY MOUTH DAILY, Disp: 90 tablet, Rfl: 3   citalopram  (CELEXA ) 10 MG tablet, TAKE 1 TABLET(10 MG) BY MOUTH DAILY, Disp: 90 tablet, Rfl: 0   cyclobenzaprine  (FLEXERIL ) 10 MG tablet, Take 1 tablet (10 mg total) by mouth at bedtime., Disp: 15 tablet, Rfl: 0   famotidine  (PEPCID ) 20 MG tablet, Take 1 tablet (20 mg total) by mouth 2 (two) times daily., Disp: 30 tablet, Rfl: 0   Ferrous Sulfate (IRON PO), Take 1 tablet by mouth daily at 6 (six) AM. Up to 2 tabs depending on blood loss level, Disp: , Rfl:    finasteride (PROPECIA) 1 MG tablet, Take 1 mg by mouth daily., Disp: , Rfl:    fluticasone  (FLONASE ) 50 MCG/ACT nasal spray, Place 2 sprays into both nostrils daily., Disp: 16 g, Rfl: 0   hydrOXYzine  (ATARAX ) 50 MG tablet, TAKE 1 TABLET(50 MG) BY MOUTH THREE TIMES DAILY AS NEEDED, Disp: 90 tablet, Rfl: 0   levothyroxine  (SYNTHROID ) 125 MCG tablet, Take 1 tablet (125 mcg total) by mouth daily before breakfast., Disp: 45 tablet, Rfl: 3   metFORMIN  (GLUCOPHAGE ) 500 MG tablet, Take 1 tablet (500 mg total) by mouth 2 (two) times daily with a meal., Disp: 180 tablet, Rfl: 3   naproxen  (NAPROSYN ) 500 MG tablet, TAKE 1 TABLET(500 MG) BY MOUTH TWICE DAILY WITH A MEAL, Disp: 30 tablet, Rfl: 0   ondansetron  (ZOFRAN ) 4 MG tablet, Take 1 tablet (4 mg total) by mouth every 8 (eight) hours as needed for nausea or vomiting., Disp: 20 tablet, Rfl: 1   ondansetron  (ZOFRAN -ODT) 4 MG disintegrating tablet, 4mg  ODT q4 hours prn nausea/vomit, Disp: 20 tablet, Rfl: 0   valACYclovir  (VALTREX ) 500 MG tablet, TAKE 1 CAPLET BY MOUTH TWICE DAILY AS NEEDED, Disp: , Rfl:

## 2024-07-24 ENCOUNTER — Other Ambulatory Visit: Payer: Self-pay | Admitting: Adult Health

## 2024-07-24 DIAGNOSIS — L299 Pruritus, unspecified: Secondary | ICD-10-CM

## 2024-07-25 NOTE — Telephone Encounter (Signed)
 Please advise.

## 2024-07-26 NOTE — Telephone Encounter (Signed)
 FYI PPW printed

## 2024-09-26 ENCOUNTER — Ambulatory Visit (HOSPITAL_COMMUNITY): Payer: Self-pay | Admitting: Family

## 2024-10-17 ENCOUNTER — Ambulatory Visit: Admitting: Internal Medicine
